# Patient Record
Sex: Female | Born: 1935 | Hispanic: Yes | Smoking: Former smoker
Health system: Southern US, Community
[De-identification: ages and names within clinical notes are randomized; demographics above are authoritative.]

## PROBLEM LIST (undated history)

## (undated) DIAGNOSIS — N200 Calculus of kidney: Secondary | ICD-10-CM

## (undated) DIAGNOSIS — J449 Chronic obstructive pulmonary disease, unspecified: Secondary | ICD-10-CM

## (undated) DIAGNOSIS — I10 Essential (primary) hypertension: Secondary | ICD-10-CM

## (undated) DIAGNOSIS — J439 Emphysema, unspecified: Secondary | ICD-10-CM

## (undated) DIAGNOSIS — I471 Supraventricular tachycardia, unspecified: Secondary | ICD-10-CM

## (undated) DIAGNOSIS — E7800 Pure hypercholesterolemia, unspecified: Secondary | ICD-10-CM

## (undated) DIAGNOSIS — K219 Gastro-esophageal reflux disease without esophagitis: Secondary | ICD-10-CM

## (undated) HISTORY — PX: CATARACT EXTRACTION: SUR2

## (undated) HISTORY — DX: Supraventricular tachycardia, unspecified: I47.10

## (undated) HISTORY — PX: ABDOMINAL HYSTERECTOMY: SHX81

## (undated) HISTORY — DX: Supraventricular tachycardia: I47.1

## (undated) HISTORY — PX: HERNIA REPAIR: SHX51

## (undated) HISTORY — PX: KNEE SURGERY: SHX244

---

## 2002-08-12 ENCOUNTER — Encounter: Payer: Self-pay | Admitting: Specialist

## 2002-08-12 ENCOUNTER — Encounter: Admission: RE | Admit: 2002-08-12 | Discharge: 2002-08-12 | Payer: Self-pay | Admitting: Specialist

## 2012-10-05 ENCOUNTER — Encounter (HOSPITAL_BASED_OUTPATIENT_CLINIC_OR_DEPARTMENT_OTHER): Payer: Self-pay | Admitting: *Deleted

## 2012-10-05 ENCOUNTER — Emergency Department (HOSPITAL_BASED_OUTPATIENT_CLINIC_OR_DEPARTMENT_OTHER)
Admission: EM | Admit: 2012-10-05 | Discharge: 2012-10-05 | Disposition: A | Discharge status: Home / Self Care | Payer: Medicaid Other | Attending: Emergency Medicine | Admitting: Emergency Medicine

## 2012-10-05 ENCOUNTER — Emergency Department (HOSPITAL_BASED_OUTPATIENT_CLINIC_OR_DEPARTMENT_OTHER): Payer: Medicaid Other

## 2012-10-05 DIAGNOSIS — K7689 Other specified diseases of liver: Secondary | ICD-10-CM | POA: Insufficient documentation

## 2012-10-05 DIAGNOSIS — K5730 Diverticulosis of large intestine without perforation or abscess without bleeding: Secondary | ICD-10-CM | POA: Insufficient documentation

## 2012-10-05 DIAGNOSIS — M5137 Other intervertebral disc degeneration, lumbosacral region: Secondary | ICD-10-CM | POA: Insufficient documentation

## 2012-10-05 DIAGNOSIS — R109 Unspecified abdominal pain: Secondary | ICD-10-CM | POA: Insufficient documentation

## 2012-10-05 DIAGNOSIS — Z87891 Personal history of nicotine dependence: Secondary | ICD-10-CM | POA: Insufficient documentation

## 2012-10-05 DIAGNOSIS — J438 Other emphysema: Secondary | ICD-10-CM | POA: Insufficient documentation

## 2012-10-05 DIAGNOSIS — Z87442 Personal history of urinary calculi: Secondary | ICD-10-CM | POA: Insufficient documentation

## 2012-10-05 DIAGNOSIS — N23 Unspecified renal colic: Secondary | ICD-10-CM | POA: Insufficient documentation

## 2012-10-05 DIAGNOSIS — K439 Ventral hernia without obstruction or gangrene: Secondary | ICD-10-CM | POA: Insufficient documentation

## 2012-10-05 DIAGNOSIS — N390 Urinary tract infection, site not specified: Secondary | ICD-10-CM

## 2012-10-05 DIAGNOSIS — M51379 Other intervertebral disc degeneration, lumbosacral region without mention of lumbar back pain or lower extremity pain: Secondary | ICD-10-CM | POA: Insufficient documentation

## 2012-10-05 HISTORY — DX: Calculus of kidney: N20.0

## 2012-10-05 LAB — BASIC METABOLIC PANEL
BUN: 13 mg/dL (ref 6–23)
CO2: 23 mEq/L (ref 19–32)
Calcium: 9.2 mg/dL (ref 8.4–10.5)
Chloride: 102 mEq/L (ref 96–112)
Creatinine, Ser: 0.8 mg/dL (ref 0.50–1.10)
GFR calc Af Amer: 78 mL/min — ABNORMAL LOW (ref >90)
GFR calc non Af Amer: 67 mL/min — ABNORMAL LOW (ref >90)
Glucose, Bld: 123 mg/dL — ABNORMAL HIGH (ref 70–99)
Potassium: 3.7 mEq/L (ref 3.5–5.1)
Sodium: 138 mEq/L (ref 135–145)

## 2012-10-05 LAB — URINALYSIS, ROUTINE W REFLEX MICROSCOPIC
Bilirubin Urine: NEGATIVE
Glucose, UA: NEGATIVE mg/dL
Ketones, ur: NEGATIVE mg/dL
Nitrite: POSITIVE — AB
Protein, ur: NEGATIVE mg/dL
Specific Gravity, Urine: 1.019 (ref 1.005–1.030)
Urobilinogen, UA: 0.2 mg/dL (ref 0.0–1.0)
pH: 5 (ref 5.0–8.0)

## 2012-10-05 LAB — CBC WITH DIFFERENTIAL/PLATELET
Basophils Absolute: 0 10*3/uL (ref 0.0–0.1)
Basophils Relative: 1 % (ref 0–1)
Eosinophils Absolute: 0.1 10*3/uL (ref 0.0–0.7)
Eosinophils Relative: 1 % (ref 0–5)
HCT: 45.7 % (ref 36.0–46.0)
Hemoglobin: 15.7 g/dL — ABNORMAL HIGH (ref 12.0–15.0)
Lymphocytes Relative: 20 % (ref 12–46)
Lymphs Abs: 1.6 10*3/uL (ref 0.7–4.0)
MCH: 28.6 pg (ref 26.0–34.0)
MCHC: 34.4 g/dL (ref 30.0–36.0)
MCV: 83.2 fL (ref 78.0–100.0)
Monocytes Absolute: 0.6 10*3/uL (ref 0.1–1.0)
Monocytes Relative: 7 % (ref 3–12)
Neutro Abs: 5.8 10*3/uL (ref 1.7–7.7)
Neutrophils Relative %: 72 % (ref 43–77)
Platelets: 248 10*3/uL (ref 150–400)
RBC: 5.49 MIL/uL — ABNORMAL HIGH (ref 3.87–5.11)
RDW: 12.9 % (ref 11.5–15.5)
WBC: 8.1 10*3/uL (ref 4.0–10.5)

## 2012-10-05 LAB — URINE MICROSCOPIC-ADD ON

## 2012-10-05 MED ORDER — HYDROMORPHONE HCL PF 1 MG/ML IJ SOLN
1.0000 mg | Freq: Once | INTRAMUSCULAR | Status: AC
  Administered 2012-10-05: 1 mg via INTRAVENOUS
  Filled 2012-10-05: qty 1

## 2012-10-05 MED ORDER — CIPROFLOXACIN HCL 500 MG PO TABS: 500.0000 mg | ORAL_TABLET | Freq: Two times a day (BID) | ORAL | Status: DC

## 2012-10-05 MED ORDER — ONDANSETRON HCL 4 MG/2ML IJ SOLN
4.0000 mg | Freq: Once | INTRAMUSCULAR | Status: AC
  Administered 2012-10-05: 4 mg via INTRAVENOUS
  Filled 2012-10-05: qty 2

## 2012-10-05 MED ORDER — HYDROCODONE-ACETAMINOPHEN 5-325 MG PO TABS: 1.0000 | ORAL_TABLET | ORAL | Status: AC | PRN

## 2012-10-05 NOTE — ED Provider Notes (Signed)
History     CSN: [id number]  Arrival date & time 10/05/12  1235   First MD Initiated Contact with Patient 10/05/12 1335      Chief Complaint  Patient presents with  . Flank Pain    (Consider location/radiation/quality/duration/timing/severity/associated sxs/prior treatment) HPI Comments: Patient is an 78 year old woman who has a prior history of urinary infection as well as of kidney stones.  She started having pain in the left flank on Saturday, 3 days ago.  Patient is a 78 y.o. female presenting with flank pain. The history is provided by the patient and a relative. No language interpreter was used.  Flank Pain This is a new problem. The current episode started more than 2 days ago. Episode frequency: Intermittent left flank pain. The problem has not changed since onset.Associated symptoms comments: None.. Nothing aggravates the symptoms. Nothing relieves the symptoms. She has tried nothing for the symptoms. The treatment provided no relief.    Past Medical History  Diagnosis Date  . Kidney stones     History reviewed. No pertinent past surgical history.  History reviewed. No pertinent family history.  History  Substance Use Topics  . Smoking status: Former Games developer  . Smokeless tobacco: Not on file  . Alcohol Use:     OB History    Grav Para Term Preterm Abortions TAB SAB Ect Mult Living                  Review of Systems  Constitutional: Negative for fever and chills.  HENT: Negative.   Eyes: Negative.   Respiratory: Negative.   Cardiovascular: Negative.   Gastrointestinal: Negative.   Genitourinary: Positive for flank pain.  Musculoskeletal: Negative.   Skin: Negative.   Neurological: Negative.   Psychiatric/Behavioral: Negative.     Allergies  Review of patient's allergies indicates no known allergies.  Home Medications   Current Outpatient Rx  Name  Route  Sig  Dispense  Refill  . CIPROFLOXACIN HCL 500 MG PO TABS   Oral   Take 1 tablet (500 mg  total) by mouth every 12 (twelve) hours.   10 tablet   0   . HYDROCODONE-ACETAMINOPHEN 5-325 MG PO TABS   Oral   Take 1 tablet by mouth every 4 (four) hours as needed for pain.   20 tablet   0     BP 142/63  Pulse 81  Temp 98.2 F (36.8 C) (Oral)  Resp 18  SpO2 100%  Physical Exam  Nursing note and vitals reviewed. Constitutional: She is oriented to person, place, and time. She appears well-developed and well-nourished.       In mild-moderate distress with left flank pain.  HENT:  Head: Normocephalic and atraumatic.  Right Ear: External ear normal.  Left Ear: External ear normal.  Mouth/Throat: Oropharynx is clear and moist.  Eyes: Conjunctivae normal and EOM are normal.  Neck: Normal range of motion. Neck supple.  Cardiovascular: Normal rate, regular rhythm and normal heart sounds.   Pulmonary/Chest: Effort normal and breath sounds normal.  Abdominal: Soft. Bowel sounds are normal.  Musculoskeletal: Normal range of motion. She exhibits no edema and no tenderness.       She localizes pain to the left flank.  There is no bony deformity or CVA tenderness.  Neurological: She is alert and oriented to person, place, and time.       No sensory or motor deficit.  Skin: Skin is warm and dry.  Psychiatric: She has a normal mood and affect.  Her behavior is normal.    ED Course  Procedures (including critical care time)  Labs Reviewed  URINALYSIS, ROUTINE W REFLEX MICROSCOPIC - Abnormal; Notable for the following:    APPearance CLOUDY (*)     Hgb urine dipstick SMALL (*)     Nitrite POSITIVE (*)     Leukocytes, UA SMALL (*)     All other components within normal limits  URINE MICROSCOPIC-ADD ON - Abnormal; Notable for the following:    Squamous Epithelial / LPF MANY (*)     Bacteria, UA MANY (*)     All other components within normal limits  CBC WITH DIFFERENTIAL - Abnormal; Notable for the following:    RBC 5.49 (*)     Hemoglobin 15.7 (*)     All other components  within normal limits  BASIC METABOLIC PANEL - Abnormal; Notable for the following:    Glucose, Bld 123 (*)     GFR calc non Af Amer 67 (*)     GFR calc Af Amer 78 (*)     All other components within normal limits  URINE CULTURE   Ct Abdomen Pelvis Wo Contrast  10/05/2012  *RADIOLOGY REPORT*  Clinical Data: Left-sided flank pain, history of renal stones  CT ABDOMEN AND PELVIS WITHOUT CONTRAST  Technique:  Multidetector CT imaging of the abdomen and pelvis was performed following the standard protocol without intravenous contrast.  Comparison: None.  Findings:  The lack of intravenous contrast limits the ability to evaluate solid abdominal organs.  Normal noncontrast appearance of the bilateral kidneys.  No renal stones or evidence of urinary obstruction.  No stones are seen within the expected location of either ureter or the urinary bladder.  The urinary bladder is decompressed otherwise normal.  No evidence of urinary obstruction or perinephric stranding.  Normal hepatic contour.  There is an approximately 3.8 x 2.7 cm hypoattenuating lesion within the dome of the left lobe of the liver (image 23, series 2 as well as an adjacent approximate 2.1 x 2.0 cm hypoattenuating lesion within the medial segment left lobe of liver (image 28).  There is mild diffuse decreased attenuation of the hepatic parenchyma suggestive of hepatic steatosis.  Normal noncontrast appearance of the gallbladder.  No definite ascites.  Normal noncontrast appearance of the bilateral adrenal glands, pancreas and spleen.  Extensive pan colonic diverticulosis without evidence of diverticulitis on this noncontrast examination.  The bowel is otherwise normal in course and caliber without wall thickening or evidence of obstruction.  Normal noncontrast appearance of the appendix.  No pneumoperitoneum, pneumatosis or portal venous gas.  Mild ectasia of the infrarenal abdominal aorta measuring approximately 2.5 cm in greatest transverse axial  dimension (image 43, series 2).  Moderate amount of calcified atherosclerotic plaque within the abdominal aorta.  Separate origins of the proper hepatic and splenic artery are suspected to arise directly from the abdominal aorta.  No retroperitoneal, mesenteric, pelvic or inguinal lymphadenopathy.  Post hysterectomy.  No free fluid within the pelvis.  No discrete adnexal lesion.  Limited visualization of the lower thorax demonstrates marked centrilobular and paraseptal emphysematous change.  There is minimal subpleural reticulation without honeycombing.  No focal airspace opacities.  No pleural effusion.  Minimal subsegmental atelectasis adjacent to the right major fissure.  No acute or aggressive osseous abnormalities.  Moderate degenerative change of L5 - S1 with disc space height loss, end plate irregularities sclerosis.  A bone island within the proximal right femur.  There is a wide neck mesenteric  fat containing midline ventral abdominal wall hernia is not measures approximately 2 cm in diameter (image 43).  Bilateral mesenteric fat containing inguinal hernias, right greater than left.  IMPRESSION:  1.  No explanation for the patient's left sided flank pain. Specifically, no evidence of nephrolithiasis urinary obstruction. 2.  Pan colonic diverticulosis without evidence of diverticulitis on this noncontrast examination.  3.  Wide neck mesenteric fat containing midline ventral abdominal wall hernia.  4.  Hypoattenuating hepatic lesions, incompletely evaluated without intravenous contrast though in the absence of a known primary malignancy likely represent hepatic cysts.  Further evaluation with abdominal ultrasound performed as clinically indicated. 5.  Suspected mild hepatic steatosis.  6.  Severe centrilobular and paraseptal emphysematous change within the imaged bilateral lung bases.  7.  Moderate DDD of L5 - S1.   Original Report Authenticated By: Tacey Ruiz, MD      1. Urinary tract infection      Rx for UTI with Cipro, and for hydrocodone-acetaminophen for pain.    Carleene Cooper III, MD 10/05/12 [phone]

## 2012-10-05 NOTE — ED Notes (Signed)
Pt amb to room 2 with quick steady gait . Pt reports hx of renal stones in her left kidney and started having the same type of pain on Saturday as her usual stone presentation. Pt amb to restroom for ccua.

## 2012-10-07 LAB — URINE CULTURE: Colony Count: >=100000

## 2012-10-08 NOTE — ED Notes (Signed)
+   Urine Patient treated with Cipro-sensitive to same-chart appended per protocol MD. 

## 2013-05-07 ENCOUNTER — Emergency Department (HOSPITAL_BASED_OUTPATIENT_CLINIC_OR_DEPARTMENT_OTHER): Payer: Medicaid Other

## 2013-05-07 ENCOUNTER — Encounter (HOSPITAL_BASED_OUTPATIENT_CLINIC_OR_DEPARTMENT_OTHER): Payer: Self-pay | Admitting: *Deleted

## 2013-05-07 ENCOUNTER — Emergency Department (HOSPITAL_BASED_OUTPATIENT_CLINIC_OR_DEPARTMENT_OTHER)
Admission: EM | Admit: 2013-05-07 | Discharge: 2013-05-07 | Disposition: A | Discharge status: Home / Self Care | Payer: Medicaid Other | Attending: Emergency Medicine | Admitting: Emergency Medicine

## 2013-05-07 DIAGNOSIS — M546 Pain in thoracic spine: Secondary | ICD-10-CM

## 2013-05-07 DIAGNOSIS — Z87442 Personal history of urinary calculi: Secondary | ICD-10-CM | POA: Insufficient documentation

## 2013-05-07 DIAGNOSIS — Z87891 Personal history of nicotine dependence: Secondary | ICD-10-CM | POA: Insufficient documentation

## 2013-05-07 DIAGNOSIS — R071 Chest pain on breathing: Secondary | ICD-10-CM | POA: Insufficient documentation

## 2013-05-07 DIAGNOSIS — Z9889 Other specified postprocedural states: Secondary | ICD-10-CM | POA: Insufficient documentation

## 2013-05-07 DIAGNOSIS — R109 Unspecified abdominal pain: Secondary | ICD-10-CM | POA: Insufficient documentation

## 2013-05-07 DIAGNOSIS — R0781 Pleurodynia: Secondary | ICD-10-CM

## 2013-05-07 LAB — URINE MICROSCOPIC-ADD ON

## 2013-05-07 LAB — COMPREHENSIVE METABOLIC PANEL
ALT: 12 U/L (ref 0–35)
AST: 16 U/L (ref 0–37)
Albumin: 3.9 g/dL (ref 3.5–5.2)
Alkaline Phosphatase: 74 U/L (ref 39–117)
BUN: 17 mg/dL (ref 6–23)
CO2: 22 mEq/L (ref 19–32)
Calcium: 9.5 mg/dL (ref 8.4–10.5)
Chloride: 108 mEq/L (ref 96–112)
Creatinine, Ser: 0.8 mg/dL (ref 0.50–1.10)
GFR calc Af Amer: 78 mL/min — ABNORMAL LOW (ref >90)
GFR calc non Af Amer: 67 mL/min — ABNORMAL LOW (ref >90)
Glucose, Bld: 83 mg/dL (ref 70–99)
Potassium: 3.7 mEq/L (ref 3.5–5.1)
Sodium: 142 mEq/L (ref 135–145)
Total Bilirubin: 0.5 mg/dL (ref 0.3–1.2)
Total Protein: 7.3 g/dL (ref 6.0–8.3)

## 2013-05-07 LAB — CBC WITH DIFFERENTIAL/PLATELET
Basophils Absolute: 0.1 10*3/uL (ref 0.0–0.1)
Basophils Relative: 1 % (ref 0–1)
Eosinophils Absolute: 0.2 10*3/uL (ref 0.0–0.7)
Eosinophils Relative: 2 % (ref 0–5)
HCT: 46.4 % — ABNORMAL HIGH (ref 36.0–46.0)
Hemoglobin: 15.6 g/dL — ABNORMAL HIGH (ref 12.0–15.0)
Lymphocytes Relative: 42 % (ref 12–46)
Lymphs Abs: 3 10*3/uL (ref 0.7–4.0)
MCH: 28.9 pg (ref 26.0–34.0)
MCHC: 33.6 g/dL (ref 30.0–36.0)
MCV: 85.9 fL (ref 78.0–100.0)
Monocytes Absolute: 0.6 10*3/uL (ref 0.1–1.0)
Monocytes Relative: 9 % (ref 3–12)
Neutro Abs: 3.3 10*3/uL (ref 1.7–7.7)
Neutrophils Relative %: 46 % (ref 43–77)
Platelets: 234 10*3/uL (ref 150–400)
RBC: 5.4 MIL/uL — ABNORMAL HIGH (ref 3.87–5.11)
RDW: 13.2 % (ref 11.5–15.5)
WBC: 7.1 10*3/uL (ref 4.0–10.5)

## 2013-05-07 LAB — URINALYSIS, ROUTINE W REFLEX MICROSCOPIC
Bilirubin Urine: NEGATIVE
Glucose, UA: NEGATIVE mg/dL
Ketones, ur: NEGATIVE mg/dL
Nitrite: NEGATIVE
Protein, ur: NEGATIVE mg/dL
Specific Gravity, Urine: 1.025 (ref 1.005–1.030)
Urobilinogen, UA: 0.2 mg/dL (ref 0.0–1.0)
pH: 5.5 (ref 5.0–8.0)

## 2013-05-07 LAB — TROPONIN I: Troponin I: <0.3 ng/mL (ref <0.30)

## 2013-05-07 MED ORDER — TRAMADOL HCL 50 MG PO TABS: 50.0000 mg | ORAL_TABLET | Freq: Four times a day (QID) | ORAL | Status: DC | PRN

## 2013-05-07 MED ORDER — TRAMADOL HCL 50 MG PO TABS
100.0000 mg | ORAL_TABLET | Freq: Once | ORAL | Status: AC
  Administered 2013-05-07: 100 mg via ORAL
  Filled 2013-05-07: qty 2

## 2013-05-07 NOTE — ED Notes (Signed)
MD at bedside. 

## 2013-05-07 NOTE — ED Provider Notes (Signed)
History    CSN: [id number] Arrival date & time 05/07/13  1313  First MD Initiated Contact with Patient 05/07/13 1338     Chief Complaint  Patient presents with  . Back Pain   (Consider location/radiation/quality/duration/timing/severity/associated sxs/prior Treatment) HPI Comments: No skin rash, not worse with deep breath, no cough.  No lower leg swelling or pain.    Patient is a 79 y.o. female presenting with back pain. The history is provided by the patient and a relative. The history is limited by a language barrier.  Back Pain Location:  Thoracic spine (right lateral posterior rib cage area.) Quality:  Aching Radiates to:  Does not radiate Pain severity:  Moderate Pain is:  Same all the time Onset quality:  Gradual Duration:  1 week Timing:  Constant Progression:  Waxing and waning Chronicity:  Recurrent (She recalls having similar pain about 6 months ago, seen here.  Review of records however shows she had left flank pain) Worsened by:  Movement Associated symptoms: no abdominal pain, no chest pain, no dysuria, no fever and no numbness    Past Medical History  Diagnosis Date  . Kidney stones    Past Surgical History  Procedure Laterality Date  . Hernia repair     History reviewed. No pertinent family history. History  Substance Use Topics  . Smoking status: Former Games developer  . Smokeless tobacco: Not on file  . Alcohol Use: Not on file   OB History   Grav Para Term Preterm Abortions TAB SAB Ect Mult Living                 Review of Systems  Unable to perform ROS: Other  Constitutional: Negative for fever.  Cardiovascular: Negative for chest pain.  Gastrointestinal: Negative for abdominal pain.  Genitourinary: Negative for dysuria.  Musculoskeletal: Positive for back pain.  Neurological: Negative for numbness.    Allergies  Review of patient's allergies indicates no known allergies.  Home Medications   Current Outpatient Rx  Name  Route  Sig   Dispense  Refill  . ciprofloxacin (CIPRO) 500 MG tablet   Oral   Take 1 tablet (500 mg total) by mouth every 12 (twelve) hours.   10 tablet   0   . traMADol (ULTRAM) 50 MG tablet   Oral   Take 1 tablet (50 mg total) by mouth every 6 (six) hours as needed for pain.   20 tablet   0    BP 135/66  Pulse 75  Temp(Src) 98.2 F (36.8 C) (Oral)  Resp 20  Wt 170 lb (77.111 kg)  SpO2 94% Physical Exam  Nursing note and vitals reviewed. Constitutional: She is oriented to person, place, and time. She appears well-developed and well-nourished. No distress.  HENT:  Head: Normocephalic and atraumatic.  Eyes: EOM are normal. No scleral icterus.  Neck: Normal range of motion. Neck supple.  Cardiovascular: Normal rate, regular rhythm and intact distal pulses.   No murmur heard. Pulmonary/Chest: Effort normal. No respiratory distress. She has no wheezes. She has no rales.  Abdominal: Soft. Normal appearance. She exhibits mass. She exhibits no distension. There is tenderness in the epigastric area and periumbilical area. There is no rebound. A hernia is present.  Musculoskeletal:       Back:  Neurological: She is alert and oriented to person, place, and time. She exhibits normal muscle tone. Coordination normal.  Skin: Skin is warm, dry and intact. No rash noted. She is not diaphoretic.  ED Course  Procedures (including critical care time) Labs Reviewed  CBC WITH DIFFERENTIAL - Abnormal; Notable for the following:    RBC 5.40 (*)    Hemoglobin 15.6 (*)    HCT 46.4 (*)    All other components within normal limits  COMPREHENSIVE METABOLIC PANEL - Abnormal; Notable for the following:    GFR calc non Af Amer 67 (*)    GFR calc Af Amer 78 (*)    All other components within normal limits  URINALYSIS, ROUTINE W REFLEX MICROSCOPIC - Abnormal; Notable for the following:    APPearance CLOUDY (*)    Hgb urine dipstick TRACE (*)    Leukocytes, UA TRACE (*)    All other components within  normal limits  URINE MICROSCOPIC-ADD ON - Abnormal; Notable for the following:    Squamous Epithelial / LPF MANY (*)    Bacteria, UA MANY (*)    All other components within normal limits  URINE CULTURE  TROPONIN I   Dg Ribs Unilateral W/chest Right  05/07/2013   *RADIOLOGY REPORT*  Clinical Data: Right rib pain  RIGHT RIBS AND CHEST - 3+ VIEW  Comparison: None  Findings: Heart size is normal.  Negative for heart failure or effusion.  Negative for pneumonia.  Negative for right rib fracture.  Negative for pneumothorax.  IMPRESSION: No acute abnormality.   Original Report Authenticated By: Janeece Riggers, M.D.   1. Rib pain on right side   2. Thoracic back pain     RA sat is 94% and I interpret to be adequate.   ECG at time 14:22 shows sinus bradycardia at rate 59, normal axis, T wave inversions inferior leads, normal intervals. No prior ECG.  Abn ECG.    3:33 PM Pt reports feeling improved.  Labs and CXR are neg.  Will d/c home, give Rx for ultram and refer to PCP.  MDM  I reviewed prior note from December 2013.  Pt presented with left flank pain, had a negative non contrast CT of abd/pelvis due to a reported h/o kidney stones.  It was negative for stone, treated for UTI with Cipro and Vicodin.  Pt reports having N/V after taking meds, but cannot recall what her medications were.  Thus, unsure which of her meds she had reaction to.  My assumption is Norco may have caused her to have N/V which is fairly common.  Will check ECG, troponin, check UA, rib and chest xray and obtain labs including LFT's.    Gavin Pound. Dondrea Clendenin, MD 05/07/13 1540

## 2013-05-07 NOTE — ED Notes (Signed)
Pt has hx of back problems, but over the past 2 weeks has gotten worse.

## 2013-05-07 NOTE — ED Notes (Signed)
Patient transported to X-ray 

## 2013-05-09 LAB — URINE CULTURE
Colony Count: NO GROWTH
Culture: NO GROWTH

## 2014-04-06 ENCOUNTER — Encounter (HOSPITAL_BASED_OUTPATIENT_CLINIC_OR_DEPARTMENT_OTHER): Payer: Self-pay | Admitting: Emergency Medicine

## 2014-04-06 ENCOUNTER — Emergency Department (HOSPITAL_BASED_OUTPATIENT_CLINIC_OR_DEPARTMENT_OTHER)
Admission: EM | Admit: 2014-04-06 | Discharge: 2014-04-06 | Disposition: A | Discharge status: Home / Self Care | Payer: Medicaid Other | Attending: Emergency Medicine | Admitting: Emergency Medicine

## 2014-04-06 ENCOUNTER — Emergency Department (HOSPITAL_BASED_OUTPATIENT_CLINIC_OR_DEPARTMENT_OTHER): Payer: Medicaid Other

## 2014-04-06 ENCOUNTER — Other Ambulatory Visit: Payer: Self-pay

## 2014-04-06 DIAGNOSIS — R21 Rash and other nonspecific skin eruption: Secondary | ICD-10-CM | POA: Insufficient documentation

## 2014-04-06 DIAGNOSIS — J441 Chronic obstructive pulmonary disease with (acute) exacerbation: Secondary | ICD-10-CM | POA: Insufficient documentation

## 2014-04-06 DIAGNOSIS — Z79899 Other long term (current) drug therapy: Secondary | ICD-10-CM | POA: Insufficient documentation

## 2014-04-06 DIAGNOSIS — I10 Essential (primary) hypertension: Secondary | ICD-10-CM | POA: Insufficient documentation

## 2014-04-06 DIAGNOSIS — R7981 Abnormal blood-gas level: Secondary | ICD-10-CM

## 2014-04-06 DIAGNOSIS — K219 Gastro-esophageal reflux disease without esophagitis: Secondary | ICD-10-CM | POA: Insufficient documentation

## 2014-04-06 DIAGNOSIS — Z87891 Personal history of nicotine dependence: Secondary | ICD-10-CM | POA: Insufficient documentation

## 2014-04-06 DIAGNOSIS — R0902 Hypoxemia: Secondary | ICD-10-CM | POA: Insufficient documentation

## 2014-04-06 DIAGNOSIS — M129 Arthropathy, unspecified: Secondary | ICD-10-CM | POA: Insufficient documentation

## 2014-04-06 DIAGNOSIS — Z87442 Personal history of urinary calculi: Secondary | ICD-10-CM | POA: Insufficient documentation

## 2014-04-06 DIAGNOSIS — Z791 Long term (current) use of non-steroidal anti-inflammatories (NSAID): Secondary | ICD-10-CM | POA: Insufficient documentation

## 2014-04-06 DIAGNOSIS — E7800 Pure hypercholesterolemia, unspecified: Secondary | ICD-10-CM | POA: Insufficient documentation

## 2014-04-06 DIAGNOSIS — N390 Urinary tract infection, site not specified: Secondary | ICD-10-CM | POA: Insufficient documentation

## 2014-04-06 HISTORY — DX: Gastro-esophageal reflux disease without esophagitis: K21.9

## 2014-04-06 HISTORY — DX: Chronic obstructive pulmonary disease, unspecified: J44.9

## 2014-04-06 HISTORY — DX: Pure hypercholesterolemia, unspecified: E78.00

## 2014-04-06 HISTORY — DX: Essential (primary) hypertension: I10

## 2014-04-06 LAB — COMPREHENSIVE METABOLIC PANEL
ALT: 16 U/L (ref 0–35)
AST: 25 U/L (ref 0–37)
Albumin: 3.5 g/dL (ref 3.5–5.2)
Alkaline Phosphatase: 72 U/L (ref 39–117)
BUN: 26 mg/dL — ABNORMAL HIGH (ref 6–23)
CO2: 23 mEq/L (ref 19–32)
Calcium: 8.8 mg/dL (ref 8.4–10.5)
Chloride: 93 mEq/L — ABNORMAL LOW (ref 96–112)
Creatinine, Ser: 0.9 mg/dL (ref 0.50–1.10)
GFR calc Af Amer: 67 mL/min — ABNORMAL LOW (ref >90)
GFR calc non Af Amer: 58 mL/min — ABNORMAL LOW (ref >90)
Glucose, Bld: 108 mg/dL — ABNORMAL HIGH (ref 70–99)
Potassium: 3.6 mEq/L — ABNORMAL LOW (ref 3.7–5.3)
Sodium: 134 mEq/L — ABNORMAL LOW (ref 137–147)
Total Bilirubin: 0.6 mg/dL (ref 0.3–1.2)
Total Protein: 7.4 g/dL (ref 6.0–8.3)

## 2014-04-06 LAB — CBC WITH DIFFERENTIAL/PLATELET
Basophils Absolute: 0 10*3/uL (ref 0.0–0.1)
Basophils Relative: 0 % (ref 0–1)
Eosinophils Absolute: 0.2 10*3/uL (ref 0.0–0.7)
Eosinophils Relative: 4 % (ref 0–5)
HCT: 44.7 % (ref 36.0–46.0)
Hemoglobin: 15.7 g/dL — ABNORMAL HIGH (ref 12.0–15.0)
Lymphocytes Relative: 20 % (ref 12–46)
Lymphs Abs: 0.9 10*3/uL (ref 0.7–4.0)
MCH: 29.8 pg (ref 26.0–34.0)
MCHC: 35.1 g/dL (ref 30.0–36.0)
MCV: 84.8 fL (ref 78.0–100.0)
Monocytes Absolute: 0.5 10*3/uL (ref 0.1–1.0)
Monocytes Relative: 11 % (ref 3–12)
Neutro Abs: 2.9 10*3/uL (ref 1.7–7.7)
Neutrophils Relative %: 64 % (ref 43–77)
Platelets: 125 10*3/uL — ABNORMAL LOW (ref 150–400)
RBC: 5.27 MIL/uL — ABNORMAL HIGH (ref 3.87–5.11)
RDW: 12.6 % (ref 11.5–15.5)
WBC: 4.6 10*3/uL (ref 4.0–10.5)

## 2014-04-06 LAB — URINE MICROSCOPIC-ADD ON

## 2014-04-06 LAB — URINALYSIS, ROUTINE W REFLEX MICROSCOPIC
Glucose, UA: NEGATIVE mg/dL
Ketones, ur: 15 mg/dL — AB
Nitrite: NEGATIVE
Protein, ur: 30 mg/dL — AB
Specific Gravity, Urine: 1.028 (ref 1.005–1.030)
Urobilinogen, UA: 1 mg/dL (ref 0.0–1.0)
pH: 5.5 (ref 5.0–8.0)

## 2014-04-06 LAB — TROPONIN I: Troponin I: <0.3 ng/mL (ref <0.30)

## 2014-04-06 MED ORDER — CIPROFLOXACIN HCL 500 MG PO TABS: 500.0000 mg | ORAL_TABLET | Freq: Two times a day (BID) | ORAL | Status: DC

## 2014-04-06 MED ORDER — ALBUTEROL SULFATE (2.5 MG/3ML) 0.083% IN NEBU: 5.0000 mg | INHALATION_SOLUTION | Freq: Once | RESPIRATORY_TRACT | Status: DC

## 2014-04-06 MED ORDER — OMEPRAZOLE 20 MG PO CPDR: 20.0000 mg | DELAYED_RELEASE_CAPSULE | Freq: Every day | ORAL | Status: DC

## 2014-04-06 MED ORDER — SODIUM CHLORIDE 0.9 % IV BOLUS (SEPSIS)
1000.0000 mL | Freq: Once | INTRAVENOUS | Status: AC
Start: 2014-04-06 — End: 2014-04-06
  Administered 2014-04-06: 1000 mL via INTRAVENOUS

## 2014-04-06 MED ORDER — DIPHENHYDRAMINE HCL 25 MG PO TABS: 25.0000 mg | ORAL_TABLET | Freq: Four times a day (QID) | ORAL | Status: DC

## 2014-04-06 NOTE — ED Notes (Signed)
Returned from radiology. O2 2LNC applied

## 2014-04-06 NOTE — ED Notes (Signed)
Patient ambulated in hall on room air.  SpO2 87%, HR 96, RR 20.  After short distance walking SpO2 88-90%, HR 101, RR 22.  Once back in room SpO2 on monitor 87% on room air and recovered to 92%. +DOE noted after returning to room.

## 2014-04-06 NOTE — ED Notes (Addendum)
MD at bedside-pt son now at [facility]

## 2014-04-06 NOTE — ED Notes (Signed)
O2 off with continuous sat per EDP orders

## 2014-04-06 NOTE — ED Notes (Signed)
Per granddaughter-pt c/o weak, fever, nausea, decreased appetite x 1 week-scattered rash started today-pt A/O-speaks minimal english

## 2014-04-06 NOTE — ED Notes (Addendum)
Started on O2 2LNC-EDP notified

## 2014-04-06 NOTE — ED Notes (Signed)
EDP at [facility]-pt appears slight SOB when talking with granddaughter-placed on sat-89% RA-granddaughter states pt has emphysema-orders to keep RA unless <89%

## 2014-04-06 NOTE — ED Provider Notes (Addendum)
CSN: [id number]     Arrival date & time 04/06/14  1102 History   First MD Initiated Contact with Patient 04/06/14 1127     Chief Complaint  Patient presents with  . Weakness     (Consider location/radiation/quality/duration/timing/severity/associated sxs/prior Treatment) Patient is a 80 y.o. female presenting with weakness. The history is provided by a relative. The history is limited by a language barrier. A language interpreter was used.  Weakness This is a new problem. Episode onset: 1 week. The problem occurs constantly. The problem has been gradually worsening. Associated symptoms include headaches and shortness of breath. Pertinent negatives include no chest pain and no abdominal pain. Associated symptoms comments: Subjective fever and chills.  No cough but occassional SOB.  Decreased appetite.  No urinary sx and denies abd pain.  Rash starting today that is itchy. Nothing aggravates the symptoms. Nothing relieves the symptoms. She has tried nothing for the symptoms. The treatment provided no relief.    Past Medical History  Diagnosis Date  . Kidney stones   . Arthritis   . GERD (gastroesophageal reflux disease)   . High cholesterol   . Hypertension   . COPD (chronic obstructive pulmonary disease)    Past Surgical History  Procedure Laterality Date  . Hernia repair    . Knee surgery    . Abdominal hysterectomy    . Cataract extraction     No family history on file. History  Substance Use Topics  . Smoking status: Former Research scientist (life sciences)  . Smokeless tobacco: Not on file  . Alcohol Use: No   OB History   Grav Para Term Preterm Abortions TAB SAB Ect Mult Living                 Review of Systems  Constitutional: Positive for fever, chills and appetite change.  HENT: Negative for rhinorrhea.   Respiratory: Positive for shortness of breath. Negative for cough and wheezing.   Cardiovascular: Negative for chest pain.  Gastrointestinal: Negative for abdominal pain.   Genitourinary: Negative for dysuria.  Neurological: Positive for weakness and headaches.  All other systems reviewed and are negative.     Allergies  Review of patient's allergies indicates no known allergies.  Home Medications   Prior to Admission medications   Medication Sig Start Date End Date Taking? Authorizing Provider  gemfibrozil (LOPID) 600 MG tablet Take 600 mg by mouth 2 (two) times daily before a meal.   Yes Historical Provider, MD  HYDRALAZINE-HCTZ PO Take by mouth.   Yes Historical Provider, MD  nabumetone (RELAFEN) 500 MG tablet Take 500 mg by mouth daily.   Yes Historical Provider, MD  omeprazole (PRILOSEC) 20 MG capsule Take 20 mg by mouth daily.   Yes Historical Provider, MD  ciprofloxacin (CIPRO) 500 MG tablet Take 1 tablet (500 mg total) by mouth every 12 (twelve) hours. 10/05/12   Mylinda Latina III, MD  traMADol (ULTRAM) 50 MG tablet Take 1 tablet (50 mg total) by mouth every 6 (six) hours as needed for pain. 05/07/13   Saddie Benders. Ghim, MD   BP 105/62  Pulse 84  Temp(Src) 98.9 F (37.2 C) (Oral)  Resp 24  SpO2 94% Physical Exam  Nursing note and vitals reviewed. Constitutional: She is oriented to person, place, and time. She appears well-developed and well-nourished. No distress.  HENT:  Head: Normocephalic and atraumatic.  Mouth/Throat: Oropharynx is clear and moist. Mucous membranes are dry.  Eyes: Conjunctivae and EOM are normal. Pupils are equal, round, and reactive  to light.  Neck: Normal range of motion. Neck supple.  Cardiovascular: Normal rate, regular rhythm and intact distal pulses.   No murmur heard. Pulmonary/Chest: Effort normal and breath sounds normal. No respiratory distress. She has no wheezes. She has no rales. She exhibits no tenderness.  Abdominal: Soft. Normal appearance. She exhibits no distension. There is tenderness in the suprapubic area. There is no rebound, no guarding and no CVA tenderness.  Musculoskeletal: Normal range of  motion. She exhibits no edema and no tenderness.  Neurological: She is alert and oriented to person, place, and time.  Skin: Skin is warm and dry. Rash noted. No lesion and no purpura noted. Rash is maculopapular. Rash is not pustular and not vesicular. There is erythema.  Blanching confluent rash over the thighs, upper ext and back.  Excoriated and blanching  Psychiatric: She has a normal mood and affect. Her behavior is normal.    ED Course  Procedures (including critical care time) Labs Review Labs Reviewed  CBC WITH DIFFERENTIAL - Abnormal; Notable for the following:    RBC 5.27 (*)    Hemoglobin 15.7 (*)    Platelets 125 (*)    All other components within normal limits  COMPREHENSIVE METABOLIC PANEL - Abnormal; Notable for the following:    Sodium 134 (*)    Potassium 3.6 (*)    Chloride 93 (*)    Glucose, Bld 108 (*)    BUN 26 (*)    GFR calc non Af Amer 58 (*)    GFR calc Af Amer 67 (*)    All other components within normal limits  URINALYSIS, ROUTINE W REFLEX MICROSCOPIC - Abnormal; Notable for the following:    Color, Urine AMBER (*)    APPearance CLOUDY (*)    Hgb urine dipstick SMALL (*)    Bilirubin Urine MODERATE (*)    Ketones, ur 15 (*)    Protein, ur 30 (*)    Leukocytes, UA SMALL (*)    All other components within normal limits  URINE MICROSCOPIC-ADD ON - Abnormal; Notable for the following:    Squamous Epithelial / LPF FEW (*)    Bacteria, UA MANY (*)    Casts HYALINE CASTS (*)    All other components within normal limits  TROPONIN I    Imaging Review Dg Chest 2 View  04/06/2014   CLINICAL DATA:  Short of breath.  Loss of appetite.  EXAM: CHEST  2 VIEW  COMPARISON:  05/07/2013  FINDINGS: Cardiac silhouette is mildly enlarged. Aorta is tortuous. No mediastinal or hilar masses. The pulmonary arteries are mildly enlarged, but stable.  Lungs are hyperexpanded. There is lung base opacity most consistent with atelectasis/scarring. Emphysema is evident in the  upper lobes. These changes are stable. No lung consolidation or edema. No pleural effusion or pneumothorax.  Bony thorax is demineralized but intact.  IMPRESSION: No acute cardiopulmonary disease.  COPD.   Electronically Signed   By: Lajean Manes M.D.   On: 04/06/2014 12:18     EKG Interpretation   Date/Time:  Thursday April 06 2014 11:44:19 EDT Ventricular Rate:  83 PR Interval:  142 QRS Duration: 86 QT Interval:  368 QTC Calculation: 432 R Axis:   -5 Text Interpretation:  Normal sinus rhythm Septal infarct , age  undetermined No significant change since last tracing Confirmed by  Maryan Rued  MD, Loree Fee ([id number]) on 04/06/2014 12:16:09 PM      MDM   Final diagnoses:  UTI (lower urinary tract infection)  Rash  GERD (gastroesophageal reflux disease)  Borderline low oxygen saturation level    Patient is a only Spanish speaker presenting with family for one week of not feeling well with fever and chills, generalized weakness and decreased appetite. Denies cough but intermittent shortness of breath. Has not been evaluated for this complaint and was feeling better until today she developed a generalized rash that is itchy in nature with worsening general malaise. She symptoms concerning for pharyngitis has no abdominal tenderness except for some mild suprapubic tenderness. Lung sounds are clear however patient desats to 86% on room air and was placed on 2 L of oxygen. She does have a history of being a prior smoker and COPD but does not use inhalers regularly. No prior cardiac etiology an EKG is unchanged from prior. Patient is afebrile here.  Concern for infectious etiology of patient's symptoms UTI versus lung pathology versus atypical ACS as the cause of patient's symptoms. The rash appears to be allergic in nature. Patient has not been on any new medications or antibiotics at this time. She denies any change in diet oral or soaps or laundry detergents.  Chest x-ray, CBC, CMP, UA, troponin  pending. Patient given IV fluids  12:53 PM Labs wnl except for many bacteria in the urine and 3-6 wbc's.  After hydration pt taken off Oxygen and will ambulate.  Pt sats are consistently 89-94%.  She has hx of COPD but denies any worsening of her chronic SOB.  She is laughing and talking to family and states she really just came here for the rash.  Pt and family counseled about low O2 sats and follow up with PCP for more testing.  EKG wnl and trop neg.    Blanchie Dessert, MD 04/06/14 1449  Blanchie Dessert, MD 04/06/14 1453

## 2014-06-12 ENCOUNTER — Encounter (INDEPENDENT_AMBULATORY_CARE_PROVIDER_SITE_OTHER): Payer: Self-pay | Admitting: Surgery

## 2014-06-12 ENCOUNTER — Ambulatory Visit (INDEPENDENT_AMBULATORY_CARE_PROVIDER_SITE_OTHER): Payer: Medicaid Other | Admitting: Surgery

## 2014-06-12 VITALS — BP 122/70 | HR 60 | Temp 98.5°F | Ht 65.0 in | Wt 172.0 lb

## 2014-06-12 DIAGNOSIS — K432 Incisional hernia without obstruction or gangrene: Secondary | ICD-10-CM | POA: Insufficient documentation

## 2014-06-12 NOTE — Progress Notes (Signed)
General Surgery [facility]  Chief Complaint  Patient presents with  . New Evaluation    Umbilical Hernia - referral from Dr. Iona Beard Osei-Bonsu    HISTORY: Patient is an 80 year old female referred by her primary care physician for evaluation of abdominal wall hernia. Patient has a history of previous hernia repair performed over 10 years ago in [place], [place]. Details of that procedure are not available.  Patient speaks limited [place]. She is accompanied by her granddaughter who acts as Optometrist and is fluent in [place] and [place].  Patient notes that the hernia recurred over 3 years ago. It has gradually increased in size. It now causes some discomfort. She denies any signs or symptoms of obstruction.  Previous abdominal surgery includes hysterectomy and a right inguinal hernia repair. Patient does not know whether mesh was used in the hernia repair or not.  Past Medical History  Diagnosis Date  . Kidney stones   . Arthritis   . GERD (gastroesophageal reflux disease)   . High cholesterol   . Hypertension   . COPD (chronic obstructive pulmonary disease)     Current Outpatient Prescriptions  Medication Sig Dispense Refill  . HYDROcodone-acetaminophen (NORCO) 10-325 MG per tablet Take 1 tablet by mouth every 6 (six) hours as needed.      . nabumetone (RELAFEN) 500 MG tablet Take 500 mg by mouth daily.       No current facility-administered medications for this visit.    No Known Allergies  History reviewed. No pertinent family history.  History   Social History  . Marital Status: Widowed    Spouse Name: N/A    Number of Children: N/A  . Years of Education: N/A   Social History Main Topics  . Smoking status: Former Research scientist (life sciences)  . Smokeless tobacco: None  . Alcohol Use: No  . Drug Use: No  . Sexual Activity: None   Other Topics Concern  . None   Social History Narrative  . None    REVIEW OF SYSTEMS - PERTINENT POSITIVES  ONLY: Denies signs or symptoms of obstruction. Intermittent pain at site of hernia. Gradual enlargement over the past 3 years.  EXAM: Filed Vitals:   06/12/14 0931  BP: 122/70  Pulse: 60  Temp: 98.5 F (36.9 C)    GENERAL: well-developed, well-nourished, no acute distress HEENT: normocephalic; pupils equal and reactive; sclerae clear; dentition good; mucous membranes moist NECK:  No palpable masses in the thyroid bed; symmetric on extension; no palpable anterior or posterior cervical lymphadenopathy; no supraclavicular masses; no tenderness CHEST: clear to auscultation bilaterally without rales, rhonchi, or wheezes CARDIAC: regular rate and rhythm without significant murmur; peripheral pulses are full ABDOMEN: soft without distension; bowel sounds present; no mass; no hepatosplenomegaly; well-healed incision in the upper midline, Pfannenstiel, and right groin; with Valsalva there is an obvious hernia in the upper midline of the abdominal wall underlying the previous surgical incision; fascial defect is approximately 3 cm in diameter but the hernia is not fully reducible and causes mild to moderate discomfort on manipulation; no evidence of umbilical hernia EXT:  non-tender without edema; no deformity NEURO: no gross focal deficits; no sign of tremor   LABORATORY RESULTS: See [facility] (CHL-Epic) for most recent results  RADIOLOGY RESULTS: See [facility] (CHL-Epic) for most recent results  IMPRESSION: Incisional hernia, incarcerated, symptomatic  PLAN: I discussed the above findings at length with the patient and her granddaughter. I provided him with written literature in Spanish to review  at home.  I recommended repair of her incisional hernia which is incarcerated and symptomatic. We discussed the use of prosthetic mesh. I have recommended open repair as opposed to laparoscopic repair. I would plan on an overnight hospital stay following the procedure. We discussed  limitations on her activities after surgery. They understand and agree to proceed.  The risks and benefits of the procedure have been discussed at length with the patient.  The patient understands the proposed procedure, potential alternative treatments, and the course of recovery to be expected.  All of the patient's questions have been answered at this time.  The patient wishes to proceed with surgery.  Earnstine Regal, MD, [facility]  Primary Care Physician: Benito Mccreedy, MD

## 2014-06-12 NOTE — Patient Instructions (Signed)
Hernia ventral  (Ventral Hernia) La hernia ventral (tambin llamada hernia incisional) aparece en el sitio de una herida quirrgica previa (incisin) en el abdomen. La pared abdominal se extiende desde la parte baja del trax hasta la pelvis. Si la pared abdominal se debilita por una incisin quirrgica, puede formarse una hernia. La hernia es una protuberancia del intestino o de tejido muscular que empuja hacia fuera en la parte debilitada de la pared abdominal. La hernia ventral puede agrandarse por un esfuerzo o por levantar objetos pesados.  Las Special educational needs teacher y obesas tienen un mayor riesgo de sufrir una hernia ventral. Tambin tienen un [place] personas que desarrollan infecciones despus de la ciruga o que requieren repetidas incisiones en el mismo sitio del abdomen.  CAUSAS  La hernia ventral se produce debido a la debilidad de la pared abdominal, en el sitio de una incisin.  SNTOMAS  Los sntomas ms comunes son:   Un bulto visible o protuberancia en la pared abdominal.  Dolor o sensibilidad alrededor del bulto.  Aumento de las molestias al toser o al hacer un movimiento repentino. Si la hernia obstruye una parte del intestino, puede haber complicaciones graves (hernia encarcelada o estrangulada). Puede llegar a ser un problema que requiere [place] de emergencia debido a que se corta el flujo de sangre al intestino bloqueado. Los sntomas pueden ser:   Feliberto Harts de vomitar (nuseas).  Vmitos.  Hinchazn en el estmago (distensin) o inflamacin.  Cristy Hilts.  Frecuencia cardaca rpida. DIAGNSTICO  El mdico le har una historia clnica y le har un examen fsico. Le indicar varios estudios, como:   Anlisis de [place]ago.  Pruebas de [place].  Ecografas.  Radiografas.  Tomografa computada (TC). TRATAMIENTO  Una observacin atenta puede ser todo lo que se necesite para una hernia pequea que no causa sntomas. Su mdico puede recomendar el uso de un cinturn de  soporte (braguero) para Print production planner la pared abdominal. Para las hernias ms grandes o dolorosas, se recomienda una ciruga para reparar la hernia. Si la hernia se estrangula, ser necesaria [place].  INSTRUCCIONES PARA EL CUIDADO EN EL HOGAR   Evite ejercer presin o tensin sobre el rea abdominal.  Evite levantar objetos pesados  .  Use una buena postura del cuerpo para realizar tareas fsicas. Consulte a su mdico acerca de las posturas apropiadas.  Use un cinturn de sostn segn las indicaciones de su mdico.  Mantenga un peso saludable.  Consuma alimentos ricos en fibra, como cereales integrales, frutas y vegetales. La fibra ayuda en el caso de dificultad para mover el intestino (constipacin).  Debe ingerir gran cantidad de lquido para mantener la orina de tono claro o color amarillo plido.  Concurra a las visitas de control, segn las indicaciones. SOLICITE ATENCIN MDICA SI:   La hernia es ms grande o ms dolorosa. SOLICITE ATENCIN MDICA DE INMEDIATO SI:   Siente dolor abdominal agudo y de inicio sbito.  El dolor se hace ms intenso.  Tiene vmitos persistentes.  Tiene sudoracin abundante.  Nota que la frecuencia cardaca est acelerada.  Tiene fiebre. ASEGRESE DE QUE:   Comprende estas instrucciones.  Controlar su enfermedad.  Solicitar ayuda de inmediato si no mejora o si empeora. Document Released: 02/07/2013 [facility] Patient Information 2015 [place]. This information is not intended to replace advice given to you by your health care provider. Make sure you discuss any questions you have with your health care provider.

## 2014-07-11 NOTE — Patient Instructions (Addendum)
Brooke Mora  07/11/2014   Your procedure is scheduled on: 07/18/2014     Report to [facility].  Follow the Signs to [place] at 0800       am  Call this number if you have problems the morning of surgery: [phone]   Remember:   Do not eat food or drink liquids after midnight.   Take these medicines the morning of surgery with A SIP OF WATER:    Do not wear jewelry, make-up or nail polish.  Do not wear lotions, powders, or perfumes.  deodorant.  Do not shave 48 hours prior to surgery.   Do not bring valuables to the hospital.  Contacts, dentures or bridgework may not be worn into surgery.  Leave suitcase in the car. After surgery it may be brought to your room.  For patients admitted to the hospital, checkout time is 11:00 AM the day of  discharge.          Please read over the following fact sheets that you were given: [facility] - Preparing for Surgery Before surgery, you can play an important role.  Because skin is not sterile, your skin needs to be as free of germs as possible.  You can reduce the number of germs on your skin by washing with CHG (chlorahexidine gluconate) soap before surgery.  CHG is an antiseptic cleaner which kills germs and bonds with the skin to continue killing germs even after washing. Please DO NOT use if you have an allergy to CHG or antibacterial soaps.  If your skin becomes reddened/irritated stop using the CHG and inform your nurse when you arrive at Short Stay. Do not shave (including legs and underarms) for at least 48 hours prior to the first CHG shower.  You may shave your face/neck. Please follow these instructions carefully:  1.  Shower with CHG Soap the night before surgery and the  morning of Surgery.  2.  If you choose to wash your hair, wash your hair first as usual with your  normal  shampoo.  3.  After you shampoo, rinse your hair and body thoroughly to remove the  shampoo.                           4.  Use CHG as you  would any other liquid soap.  You can apply chg directly  to the skin and wash                       Gently with a scrungie or clean washcloth.  5.  Apply the CHG Soap to your body ONLY FROM THE NECK DOWN.   Do not use on face/ open                           Wound or open sores. Avoid contact with eyes, ears mouth and genitals (private parts).                       Wash face,  Genitals (private parts) with your normal soap.             6.  Wash thoroughly, paying special attention to the area where your surgery  will be performed.  7.  Thoroughly rinse your body with warm water from the neck down.  8.  DO NOT shower/wash with your normal  soap after using and rinsing off  the CHG Soap.                9.  Pat yourself dry with a clean towel.            10.  Wear clean pajamas.            11.  Place clean sheets on your bed the night of your first shower and do not  sleep with pets. Day of Surgery : Do not apply any lotions/deodorants the morning of surgery.  Please wear clean clothes to the hospital/surgery center.  FAILURE TO FOLLOW THESE INSTRUCTIONS MAY RESULT IN THE CANCELLATION OF YOUR SURGERY PATIENT SIGNATURE_________________________________  NURSE SIGNATURE__________________________________  ________________________________________________________________________ , coughing and deep breathing exercises, leg exercises

## 2014-07-12 ENCOUNTER — Encounter (HOSPITAL_COMMUNITY)
Admission: RE | Admit: 2014-07-12 | Discharge: 2014-07-12 | Disposition: A | Discharge status: Home / Self Care | Payer: Medicaid Other | Source: Ambulatory Visit | Attending: Surgery | Admitting: Surgery

## 2014-07-12 ENCOUNTER — Encounter (HOSPITAL_COMMUNITY): Payer: Self-pay | Admitting: Pharmacy Technician

## 2014-07-12 ENCOUNTER — Encounter (HOSPITAL_COMMUNITY): Payer: Self-pay

## 2014-07-12 DIAGNOSIS — K430 Incisional hernia with obstruction, without gangrene: Secondary | ICD-10-CM | POA: Insufficient documentation

## 2014-07-12 DIAGNOSIS — Z01812 Encounter for preprocedural laboratory examination: Secondary | ICD-10-CM | POA: Diagnosis present

## 2014-07-12 LAB — CBC
HCT: 45.7 % (ref 36.0–46.0)
Hemoglobin: 15.1 g/dL — ABNORMAL HIGH (ref 12.0–15.0)
MCH: 28.8 pg (ref 26.0–34.0)
MCHC: 33 g/dL (ref 30.0–36.0)
MCV: 87.2 fL (ref 78.0–100.0)
Platelets: 205 10*3/uL (ref 150–400)
RBC: 5.24 MIL/uL — ABNORMAL HIGH (ref 3.87–5.11)
RDW: 13.3 % (ref 11.5–15.5)
WBC: 7.3 10*3/uL (ref 4.0–10.5)

## 2014-07-12 LAB — BASIC METABOLIC PANEL
Anion gap: 10 (ref 5–15)
BUN: 15 mg/dL (ref 6–23)
CO2: 26 mEq/L (ref 19–32)
Calcium: 9.2 mg/dL (ref 8.4–10.5)
Chloride: 106 mEq/L (ref 96–112)
Creatinine, Ser: 0.71 mg/dL (ref 0.50–1.10)
GFR calc Af Amer: >90 mL/min (ref >90)
GFR calc non Af Amer: 78 mL/min — ABNORMAL LOW (ref >90)
Glucose, Bld: 106 mg/dL — ABNORMAL HIGH (ref 70–99)
Potassium: 4.4 mEq/L (ref 3.7–5.3)
Sodium: 142 mEq/L (ref 137–147)

## 2014-07-14 NOTE — Progress Notes (Signed)
Quick Note:  These results are acceptable for scheduled surgery.  Bijan Ridgley M. Kolbi Tofte, MD, FACS [facility] Office: [phone]   ______ 

## 2014-07-18 ENCOUNTER — Encounter (HOSPITAL_COMMUNITY)
Admission: RE | Disposition: A | Discharge status: Home / Self Care | Payer: Self-pay | Source: Ambulatory Visit | Attending: Surgery

## 2014-07-18 ENCOUNTER — Encounter (HOSPITAL_COMMUNITY): Payer: Medicaid Other | Admitting: Anesthesiology

## 2014-07-18 ENCOUNTER — Ambulatory Visit (HOSPITAL_COMMUNITY): Payer: Medicaid Other | Admitting: Anesthesiology

## 2014-07-18 ENCOUNTER — Observation Stay (HOSPITAL_COMMUNITY)
Admission: RE | Admit: 2014-07-18 | Discharge: 2014-07-19 | Disposition: A | Discharge status: Home / Self Care | Payer: Medicaid Other | Source: Ambulatory Visit | Attending: Surgery | Admitting: Surgery

## 2014-07-18 ENCOUNTER — Encounter (HOSPITAL_COMMUNITY): Payer: Self-pay | Admitting: *Deleted

## 2014-07-18 DIAGNOSIS — J449 Chronic obstructive pulmonary disease, unspecified: Secondary | ICD-10-CM | POA: Insufficient documentation

## 2014-07-18 DIAGNOSIS — E7800 Pure hypercholesterolemia, unspecified: Secondary | ICD-10-CM | POA: Diagnosis not present

## 2014-07-18 DIAGNOSIS — K219 Gastro-esophageal reflux disease without esophagitis: Secondary | ICD-10-CM | POA: Insufficient documentation

## 2014-07-18 DIAGNOSIS — Z87442 Personal history of urinary calculi: Secondary | ICD-10-CM | POA: Insufficient documentation

## 2014-07-18 DIAGNOSIS — K430 Incisional hernia with obstruction, without gangrene: Principal | ICD-10-CM | POA: Diagnosis present

## 2014-07-18 DIAGNOSIS — Z87891 Personal history of nicotine dependence: Secondary | ICD-10-CM | POA: Insufficient documentation

## 2014-07-18 DIAGNOSIS — J4489 Other specified chronic obstructive pulmonary disease: Secondary | ICD-10-CM | POA: Diagnosis not present

## 2014-07-18 DIAGNOSIS — I10 Essential (primary) hypertension: Secondary | ICD-10-CM | POA: Diagnosis not present

## 2014-07-18 DIAGNOSIS — K432 Incisional hernia without obstruction or gangrene: Secondary | ICD-10-CM | POA: Diagnosis present

## 2014-07-18 HISTORY — PX: INCISIONAL HERNIA REPAIR: SHX193

## 2014-07-18 HISTORY — PX: INSERTION OF MESH: SHX5868

## 2014-07-18 SURGERY — REPAIR, HERNIA, INCISIONAL
Anesthesia: General

## 2014-07-18 MED ORDER — FENTANYL CITRATE 0.05 MG/ML IJ SOLN
INTRAMUSCULAR | Status: DC | PRN
  Administered 2014-07-18: 100 ug via INTRAVENOUS
  Administered 2014-07-18 (×3): 50 ug via INTRAVENOUS

## 2014-07-18 MED ORDER — ROCURONIUM BROMIDE 100 MG/10ML IV SOLN
INTRAVENOUS | Status: AC
  Filled 2014-07-18: qty 1

## 2014-07-18 MED ORDER — EPHEDRINE SULFATE 50 MG/ML IJ SOLN
INTRAMUSCULAR | Status: DC | PRN
  Administered 2014-07-18 (×2): 10 mg via INTRAVENOUS

## 2014-07-18 MED ORDER — HYDROMORPHONE HCL 1 MG/ML IJ SOLN: 1.0000 mg | INTRAMUSCULAR | Status: DC | PRN

## 2014-07-18 MED ORDER — ONDANSETRON HCL 4 MG/2ML IJ SOLN
INTRAMUSCULAR | Status: DC | PRN
  Administered 2014-07-18: 4 mg via INTRAVENOUS

## 2014-07-18 MED ORDER — CEFAZOLIN SODIUM-DEXTROSE 2-3 GM-% IV SOLR
INTRAVENOUS | Status: AC
  Filled 2014-07-18: qty 50

## 2014-07-18 MED ORDER — DEXAMETHASONE SODIUM PHOSPHATE 10 MG/ML IJ SOLN
INTRAMUSCULAR | Status: DC | PRN
  Administered 2014-07-18: 10 mg via INTRAVENOUS

## 2014-07-18 MED ORDER — CEFAZOLIN SODIUM-DEXTROSE 2-3 GM-% IV SOLR
2.0000 g | INTRAVENOUS | Status: AC
  Administered 2014-07-18: 2 g via INTRAVENOUS

## 2014-07-18 MED ORDER — PROPOFOL 10 MG/ML IV BOLUS
INTRAVENOUS | Status: DC | PRN
  Administered 2014-07-18: 180 mg via INTRAVENOUS

## 2014-07-18 MED ORDER — GLYCOPYRROLATE 0.2 MG/ML IJ SOLN
INTRAMUSCULAR | Status: AC
  Filled 2014-07-18: qty 3

## 2014-07-18 MED ORDER — SCOPOLAMINE 1 MG/3DAYS TD PT72
1.0000 | MEDICATED_PATCH | TRANSDERMAL | Status: DC
  Administered 2014-07-18: 1.5 mg via TRANSDERMAL

## 2014-07-18 MED ORDER — BUPIVACAINE-EPINEPHRINE (PF) 0.25% -1:200000 IJ SOLN
INTRAMUSCULAR | Status: AC
  Filled 2014-07-18: qty 30

## 2014-07-18 MED ORDER — SCOPOLAMINE 1 MG/3DAYS TD PT72
MEDICATED_PATCH | TRANSDERMAL | Status: AC
  Filled 2014-07-18: qty 1

## 2014-07-18 MED ORDER — GLYCOPYRROLATE 0.2 MG/ML IJ SOLN
INTRAMUSCULAR | Status: DC | PRN
  Administered 2014-07-18: 0.6 mg via INTRAVENOUS

## 2014-07-18 MED ORDER — LIDOCAINE HCL (CARDIAC) 20 MG/ML IV SOLN
INTRAVENOUS | Status: DC | PRN
  Administered 2014-07-18: 50 mg via INTRAVENOUS

## 2014-07-18 MED ORDER — DEXAMETHASONE SODIUM PHOSPHATE 10 MG/ML IJ SOLN
INTRAMUSCULAR | Status: AC
  Filled 2014-07-18: qty 1

## 2014-07-18 MED ORDER — ONDANSETRON HCL 4 MG/2ML IJ SOLN
INTRAMUSCULAR | Status: AC
Start: 2014-07-18 — End: 2014-07-18
  Filled 2014-07-18: qty 2

## 2014-07-18 MED ORDER — NEOSTIGMINE METHYLSULFATE 10 MG/10ML IV SOLN
INTRAVENOUS | Status: AC
  Filled 2014-07-18: qty 1

## 2014-07-18 MED ORDER — FENTANYL CITRATE 0.05 MG/ML IJ SOLN
25.0000 ug | INTRAMUSCULAR | Status: DC | PRN
  Administered 2014-07-18 (×2): 25 ug via INTRAVENOUS

## 2014-07-18 MED ORDER — BUPIVACAINE-EPINEPHRINE 0.25% -1:200000 IJ SOLN
INTRAMUSCULAR | Status: DC | PRN
  Administered 2014-07-18: 10 mL

## 2014-07-18 MED ORDER — NEOSTIGMINE METHYLSULFATE 10 MG/10ML IV SOLN
INTRAVENOUS | Status: DC | PRN
  Administered 2014-07-18: 5 mg via INTRAVENOUS

## 2014-07-18 MED ORDER — 0.9 % SODIUM CHLORIDE (POUR BTL) OPTIME
TOPICAL | Status: DC | PRN
  Administered 2014-07-18: 1000 mL

## 2014-07-18 MED ORDER — HYDROCODONE-ACETAMINOPHEN 5-325 MG PO TABS: 1.0000 | ORAL_TABLET | ORAL | Status: DC | PRN

## 2014-07-18 MED ORDER — PROPOFOL 10 MG/ML IV BOLUS
INTRAVENOUS | Status: AC
  Filled 2014-07-18: qty 20

## 2014-07-18 MED ORDER — ONDANSETRON HCL 4 MG/2ML IJ SOLN: 4.0000 mg | Freq: Four times a day (QID) | INTRAMUSCULAR | Status: DC | PRN

## 2014-07-18 MED ORDER — FENTANYL CITRATE 0.05 MG/ML IJ SOLN
INTRAMUSCULAR | Status: AC
  Filled 2014-07-18: qty 5

## 2014-07-18 MED ORDER — LIDOCAINE HCL (CARDIAC) 20 MG/ML IV SOLN
INTRAVENOUS | Status: AC
  Filled 2014-07-18: qty 5

## 2014-07-18 MED ORDER — SODIUM CHLORIDE 0.9 % IJ SOLN
INTRAMUSCULAR | Status: AC
  Filled 2014-07-18: qty 10

## 2014-07-18 MED ORDER — FENTANYL CITRATE 0.05 MG/ML IJ SOLN
INTRAMUSCULAR | Status: AC
  Filled 2014-07-18: qty 2

## 2014-07-18 MED ORDER — EPHEDRINE SULFATE 50 MG/ML IJ SOLN
INTRAMUSCULAR | Status: AC
  Filled 2014-07-18: qty 1

## 2014-07-18 MED ORDER — ONDANSETRON HCL 4 MG PO TABS: 4.0000 mg | ORAL_TABLET | Freq: Four times a day (QID) | ORAL | Status: DC | PRN

## 2014-07-18 MED ORDER — MEPERIDINE HCL 50 MG/ML IJ SOLN: 6.2500 mg | INTRAMUSCULAR | Status: DC | PRN

## 2014-07-18 MED ORDER — PROMETHAZINE HCL 25 MG/ML IJ SOLN
6.2500 mg | INTRAMUSCULAR | Status: DC | PRN
Start: 2014-07-18 — End: 2014-07-18

## 2014-07-18 MED ORDER — LACTATED RINGERS IV SOLN
INTRAVENOUS | Status: DC
  Administered 2014-07-18: 1000 mL via INTRAVENOUS
  Administered 2014-07-18: 11:00:00 via INTRAVENOUS
  Administered 2014-07-18: 1000 mL via INTRAVENOUS

## 2014-07-18 MED ORDER — KCL IN DEXTROSE-NACL 30-5-0.45 MEQ/L-%-% IV SOLN
INTRAVENOUS | Status: DC
  Administered 2014-07-18: 14:00:00 via INTRAVENOUS
  Filled 2014-07-18 (×3): qty 1000

## 2014-07-18 MED ORDER — ROCURONIUM BROMIDE 100 MG/10ML IV SOLN
INTRAVENOUS | Status: DC | PRN
  Administered 2014-07-18: 5 mg via INTRAVENOUS
  Administered 2014-07-18: 25 mg via INTRAVENOUS

## 2014-07-18 MED ORDER — ACETAMINOPHEN 325 MG PO TABS
650.0000 mg | ORAL_TABLET | ORAL | Status: DC | PRN
  Administered 2014-07-18: 650 mg via ORAL
  Filled 2014-07-18: qty 2

## 2014-07-18 MED ORDER — SUCCINYLCHOLINE CHLORIDE 20 MG/ML IJ SOLN
INTRAMUSCULAR | Status: DC | PRN
  Administered 2014-07-18: 100 mg via INTRAVENOUS

## 2014-07-18 SURGICAL SUPPLY — 31 items
APL SKNCLS STERI-STRIP NONHPOA (GAUZE/BANDAGES/DRESSINGS) ×1
BENZOIN TINCTURE PRP APPL 2/3 (GAUZE/BANDAGES/DRESSINGS) ×1 IMPLANT
BINDER ABDOMINAL 12 ML 46-62 (SOFTGOODS) IMPLANT
BLADE HEX COATED 2.75 (ELECTRODE) ×2 IMPLANT
CANISTER SUCTION 2500CC (MISCELLANEOUS) ×2 IMPLANT
DRAPE LAPAROSCOPIC ABDOMINAL (DRAPES) ×2 IMPLANT
ELECT REM PT RETURN 9FT ADLT (ELECTROSURGICAL) ×2
ELECTRODE REM PT RTRN 9FT ADLT (ELECTROSURGICAL) ×1 IMPLANT
GAUZE SPONGE 4X4 12PLY STRL (GAUZE/BANDAGES/DRESSINGS) ×2 IMPLANT
GLOVE BIOGEL PI IND STRL 7.0 (GLOVE) ×1 IMPLANT
GLOVE BIOGEL PI INDICATOR 7.0 (GLOVE) ×1
GLOVE SURG ORTHO 8.0 STRL STRW (GLOVE) ×2 IMPLANT
GLOVE SURG SS PI 7.5 STRL IVOR (GLOVE) ×1 IMPLANT
GOWN STRL REUS W/TWL LRG LVL3 (GOWN DISPOSABLE) ×2 IMPLANT
GOWN STRL REUS W/TWL XL LVL3 (GOWN DISPOSABLE) ×4 IMPLANT
KIT BASIN OR (CUSTOM PROCEDURE TRAY) ×2 IMPLANT
MESH VENTRALEX ST 2.5 CRC MED (Mesh General) ×1 IMPLANT
NS IRRIG 1000ML POUR BTL (IV SOLUTION) ×2 IMPLANT
PACK GENERAL/GYN (CUSTOM PROCEDURE TRAY) ×2 IMPLANT
STAPLER VISISTAT 35W (STAPLE) ×2 IMPLANT
STRIP CLOSURE SKIN 1/2X4 (GAUZE/BANDAGES/DRESSINGS) ×1 IMPLANT
SUT NOVA 0 T19/GS 22DT (SUTURE) ×2 IMPLANT
SUT NOVA 1 T20/GS 25DT (SUTURE) IMPLANT
SUT NOVA NAB GS-21 0 18 T12 DT (SUTURE) IMPLANT
SUT PDS AB 1 CTX 36 (SUTURE) ×4 IMPLANT
SUT VIC AB 2-0 CT1 27 (SUTURE) ×2
SUT VIC AB 2-0 CT1 TAPERPNT 27 (SUTURE) IMPLANT
SUT VIC AB 2-0 CT2 27 (SUTURE) ×2 IMPLANT
TAPE CLOTH SURG 4X10 WHT LF (GAUZE/BANDAGES/DRESSINGS) ×1 IMPLANT
TOWEL OR 17X26 10 PK STRL BLUE (TOWEL DISPOSABLE) ×2 IMPLANT
TRAY FOLEY CATH 14FRSI W/METER (CATHETERS) ×2 IMPLANT

## 2014-07-18 NOTE — Anesthesia Postprocedure Evaluation (Signed)
  Anesthesia Post-op Note  Patient: Brooke Mora  Procedure(s) Performed: Procedure(s): REPAIR OF INACERATED INCISIONAL HERNIA WITH MESH (N/A) INSERTION OF MESH (N/A)  Patient Location: PACU  Anesthesia Type:General  Level of Consciousness: awake, alert  and oriented  Airway and Oxygen Therapy: Patient Spontanous Breathing and Patient connected to nasal cannula oxygen  Post-op Pain: mild  Post-op Assessment: Post-op Vital signs reviewed, Patient's Cardiovascular Status Stable, Respiratory Function Stable and No signs of Nausea or vomiting  Post-op Vital Signs: Reviewed and stable  Last Vitals:  Filed Vitals:   07/18/14 1215  BP: 133/59  Pulse: 63  Temp:   Resp: 14    Complications: No apparent anesthesia complications

## 2014-07-18 NOTE — H&P (Signed)
Brooke Mora is an 80 y.o. female.    Chief Complaint: incarcerated ventral incisional hernia  HPI: Patient is an 80 year old female referred by her primary care physician for evaluation of abdominal wall hernia. Patient has a history of previous hernia repair performed over 10 years ago in [place], [place]. Details of that procedure are not available.  Patient speaks limited [place]. She is accompanied by her granddaughter who acts as Optometrist and is fluent in [place] and [place].  Patient notes that the hernia recurred over 3 years ago. It has gradually increased in size. It now causes some discomfort. She denies any signs or symptoms of obstruction.  Previous abdominal surgery includes hysterectomy and a right inguinal hernia repair. Patient does not know whether mesh was used in the hernia repair or not.  Past Medical History  Diagnosis Date  . Kidney stones   . GERD (gastroesophageal reflux disease)   . High cholesterol   . COPD (chronic obstructive pulmonary disease)     slight   . Hypertension     patient states b/p up and down not on meds    Past Surgical History  Procedure Laterality Date  . Hernia repair    . Knee surgery    . Abdominal hysterectomy    . Cataract extraction      History reviewed. No pertinent family history. Social History:  reports that she has quit smoking. She has never used smokeless tobacco. She reports that she does not drink alcohol or use illicit drugs.  Allergies:  Allergies  Allergen Reactions  . Nabumetone Itching and Swelling    Medications Prior to Admission  Medication Sig Dispense Refill  . HYDROcodone-acetaminophen (NORCO/VICODIN) 5-325 MG per tablet Take 1 tablet by mouth every 6 (six) hours as needed for moderate pain.      Marland Kitchen ibuprofen (ADVIL,MOTRIN) 200 MG tablet Take 200-400 mg by mouth every 6 (six) hours as needed for moderate pain.        No results found for this or any previous visit (from the past 48  hour(s)). No results found.  Review of Systems  Constitutional: Negative.   HENT: Negative.   Eyes: Negative.   Respiratory: Negative.   Cardiovascular: Negative.   Gastrointestinal: Positive for abdominal pain. Negative for nausea and vomiting.  Genitourinary: Negative.   Musculoskeletal: Negative.   Skin: Negative.   Neurological: Negative.   Endo/Heme/Allergies: Negative.   Psychiatric/Behavioral: Negative.     Blood pressure 161/77, pulse 74, temperature 97.8 F (36.6 C), temperature source Oral, resp. rate 18, SpO2 93.00%. Physical Exam  Constitutional: She is oriented to person, place, and time. She appears well-developed and well-nourished. No distress.  HENT:  Head: Normocephalic and atraumatic.  Right Ear: External ear normal.  Left Ear: External ear normal.  Eyes: Conjunctivae are normal. Pupils are equal, round, and reactive to light. No scleral icterus.  Neck: Normal range of motion. Neck supple. No thyromegaly present.  Cardiovascular: Normal rate, regular rhythm and normal heart sounds.   Respiratory: Effort normal and breath sounds normal.  GI: Soft. Bowel sounds are normal. She exhibits no distension. There is tenderness. There is no rebound and no guarding.  Upper midline mass consistent with incarcerated incisional hernia  Musculoskeletal: Normal range of motion. She exhibits no edema.  Neurological: She is alert and oriented to person, place, and time.  Skin: Skin is warm and dry.  Psychiatric: She has a normal mood and affect. Her behavior is normal.     Assessment/Plan Ventral  incisional hernia, incarcerated  Plan open repair with mesh  The risks and benefits of the procedure have been discussed at length with the patient.  The patient understands the proposed procedure, potential alternative treatments, and the course of recovery to be expected.  All of the patient's questions have been answered at this time.  The patient wishes to proceed with  surgery.  Earnstine Regal, MD, [facility] Office: [place] 07/18/2014, 10:29 AM

## 2014-07-18 NOTE — Anesthesia Preprocedure Evaluation (Addendum)
Anesthesia Evaluation  Patient identified by MRN, date of birth, ID band  Reviewed: Allergy & Precautions, H&P , NPO status , Patient's Chart, lab work & pertinent test results  Airway Mallampati: II  Neck ROM: Full    Dental  (+) Edentulous Upper, Edentulous Lower   Pulmonary COPDformer smoker,  breath sounds clear to auscultation        Cardiovascular hypertension, Pt. on medications Rhythm:Regular Rate:Normal     Neuro/Psych    GI/Hepatic GERD-  ,  Endo/Other    Renal/GU GFR 70     Musculoskeletal   Abdominal (+)  Abdomen: soft.    Peds  Hematology   Anesthesia Other Findings   Reproductive/Obstetrics                          Anesthesia Physical Anesthesia Plan  ASA: III  Anesthesia Plan: General   Post-op Pain Management:    Induction: Intravenous  Airway Management Planned: Oral ETT  Additional Equipment:   Intra-op Plan:   Post-operative Plan:   Informed Consent: I have reviewed the patients History and Physical, chart, labs and discussed the procedure including the risks, benefits and alternatives for the proposed anesthesia with the patient or authorized representative who has indicated his/her understanding and acceptance.     Plan Discussed with:   Anesthesia Plan Comments:         Anesthesia Quick Evaluation

## 2014-07-18 NOTE — Brief Op Note (Signed)
07/18/2014  12:03 PM  PATIENT:  Brooke Mora  80 y.o. female  PRE-OPERATIVE DIAGNOSIS:  Incarcerated Incisional Hernia  POST-OPERATIVE DIAGNOSIS:  Incarcerated Incisional Hernia  PROCEDURE:  Procedure(s): REPAIR OF INACERATED INCISIONAL HERNIA WITH MESH (N/A) INSERTION OF MESH (N/A)  SURGEON:  Surgeon(s) and Role:    * Armandina Gemma, MD - Primary    * Alphonsa Overall, MD  ANESTHESIA:   general  EBL:  Total I/O In: 1000 [I.V.:1000] Out: -   BLOOD ADMINISTERED:none  DRAINS: none   LOCAL MEDICATIONS USED:  MARCAINE     SPECIMEN:  No Specimen  DISPOSITION OF SPECIMEN:  N/A  COUNTS:  YES  TOURNIQUET:  * No tourniquets in log *  DICTATION: .Other Dictation: Dictation Number [id number]  PLAN OF CARE: Admit for overnight observation  PATIENT DISPOSITION:  PACU - hemodynamically stable.   Delay start of Pharmacological VTE agent (>24hrs) due to surgical blood loss or risk of bleeding: yes  Earnstine Regal, MD, [facility] Office: [phone]

## 2014-07-18 NOTE — Transfer of Care (Signed)
Immediate Anesthesia Transfer of Care Note  Patient: Brooke Mora  Procedure(s) Performed: Procedure(s) (LRB): REPAIR OF INACERATED INCISIONAL HERNIA WITH MESH (N/A) INSERTION OF MESH (N/A)  Patient Location: PACU  Anesthesia Type: General  Level of Consciousness: sedated, patient cooperative and responds to stimulation  Airway & Oxygen Therapy: Patient Spontanous Breathing and Patient connected to face mask oxgen  Post-op Assessment: Report given to PACU RN and Post -op Vital signs reviewed and stable  Post vital signs: Reviewed and stable  Complications: No apparent anesthesia complications

## 2014-07-19 ENCOUNTER — Encounter (HOSPITAL_COMMUNITY): Payer: Self-pay | Admitting: Surgery

## 2014-07-19 DIAGNOSIS — K430 Incisional hernia with obstruction, without gangrene: Secondary | ICD-10-CM | POA: Diagnosis not present

## 2014-07-19 MED ORDER — HYDROCODONE-ACETAMINOPHEN 5-325 MG PO TABS: 1.0000 | ORAL_TABLET | ORAL | Status: DC | PRN

## 2014-07-19 NOTE — Plan of Care (Signed)
Problem: Phase I Progression Outcomes Goal: OOB as tolerated unless otherwise ordered Outcome: Progressing oob with assist in room to [facility].

## 2014-07-19 NOTE — Progress Notes (Signed)
UR completed 

## 2014-07-19 NOTE — Discharge Summary (Signed)
Physician Discharge Summary [facility]  Patient ID: Brooke Mora MRN: [id number] DOB/AGE: 1929/03/28 80 y.o.  Admit date: 07/18/2014 Discharge date: 07/19/2014  Admission Diagnoses:  Ventral incisional hernia  Discharge Diagnoses:  Principal Problem:   Incisional hernia, without obstruction or gangrene, incarcerated Active Problems:   Incisional hernia, incarcerated   Discharged Condition: good  Hospital Course: patient admitted for observation after hernia repair with mesh.  Post op course uneventful.  Pain well controlled.  Prepared for discharge on POD#1.  Consults: None  Treatments: surgery: ventral incisional hernia repair with mesh patch  Discharge Exam: Blood pressure 126/68, pulse 61, temperature 98.5 F (36.9 C), temperature source Oral, resp. rate 18, height 5\' 4"  (1.626 m), weight 182 lb (82.555 kg), SpO2 96.00%. HEENT - clear Neck - soft Chest - clear bilaterally Cor - RRR Abd - dressing dry and intact  Disposition: Home  Discharge Instructions   Diet - low sodium heart healthy    Complete by:  As directed      Discharge instructions    Complete by:  As directed   [facility], [place]  Always review your discharge instruction sheet given to you by the facility where your surgery was performed.  A  prescription for pain medication may be given to you upon discharge.  Take your pain medication as prescribed.  If narcotic pain medicine is not needed, then you may take acetaminophen (Tylenol) or ibuprofen (Advil) as needed.  Take your usually prescribed medications unless otherwise directed.  If you need a refill on your pain medication, please contact your pharmacy.  They will contact our office to request authorization. Prescriptions will not be filled after 5 pm daily or on weekends.  You should follow a light diet the first 24 hours after arrival home, such as soup and crackers or toast.   Be sure to include plenty of fluids daily.  Resume your normal diet the day after surgery.  Most patients will experience some swelling and bruising around the surgical site.  Ice packs and reclining will help.  Swelling and bruising can take several days to resolve.   It is common to experience some constipation if taking pain medication after surgery.  Increasing fluid intake and taking a stool softener (such as Colace) will usually help or prevent this problem from occurring.  A mild laxative (Milk of Magnesia or Miralax) should be taken according to package directions if there are no bowel movements after 48 hours.  Unless discharge instructions indicate otherwise, you may remove your bandages 24-48 hours after surgery, and you may shower at that time.  You may have steri-strips (small skin tapes) in place directly over the incision.  These strips should be left on the skin for 7-10 days.  If your surgeon used skin glue on the incision, you may shower in 24 hours.  The glue will flake off over the next 2-3 weeks.  Any sutures or staples will be removed at the office during your follow-up visit.  ACTIVITIES:  You may resume regular (light) daily activities beginning the next day-such as daily self-care, walking, climbing stairs-gradually increasing activities as tolerated.  You may have sexual intercourse when it is comfortable.  Refrain from any heavy lifting or straining until approved by your doctor.  You may drive when you are no longer taking prescription pain medication, you can comfortably wear a seatbelt, and you can safely maneuver your car and apply brakes.  You should see your doctor in the office for a follow-up appointment approximately 2-3 weeks after your surgery.  Make sure that you call for this appointment within a day or two after you arrive home to insure a convenient appointment time.   WHEN TO CALL YOUR DOCTOR: Fever greater than 101.0 Inability to urinate Persistent nausea  and/or vomiting Extreme swelling or bruising Continued bleeding from incision Increased pain, redness, or drainage from the incision  The clinic staff is available to answer your questions during regular business hours.  Please don't hesitate to call and ask to speak to one of the nurses for clinical concerns.  If you have a medical emergency, go to the nearest emergency room or call 911.  A surgeon from [facility] is always on call for the hospital.   [facility] [address] [place], [place], [place]  [address]  [phone] ? [phone] ? FAX [phone]  [email or web address]     Increase activity slowly    Complete by:  As directed      Remove dressing in 24 hours    Complete by:  As directed             Medication List         HYDROcodone-acetaminophen 5-325 MG per tablet  Commonly known as:  NORCO/VICODIN  Take 1 tablet by mouth every 6 (six) hours as needed for moderate pain.     HYDROcodone-acetaminophen 5-325 MG per tablet  Commonly known as:  NORCO/VICODIN  Take 1-2 tablets by mouth every 4 (four) hours as needed for moderate pain.     ibuprofen 200 MG tablet  Commonly known as:  ADVIL,MOTRIN  Take 200-400 mg by mouth every 6 (six) hours as needed for moderate pain.           Follow-up Information   Follow up with Earnstine Regal, MD. Schedule an appointment as soon as possible for a visit in 3 weeks. (For wound re-check)    Specialty:  General Surgery   Contact information:   [address] [place] [address] [phone]       Earnstine Regal, MD, [facility] Office: [phone]   Signed: Earnstine Regal 07/19/2014, 7:59 AM

## 2014-07-19 NOTE — Op Note (Signed)
[name], BROADEN NO.:  [id number]  MEDICAL RECORD NO.:  [id number]  LOCATION:  51                         FACILITY:  [facility]  PHYSICIAN:  Earnstine Regal, MD      DATE OF BIRTH:  Jul 04, 1929  DATE OF PROCEDURE:  07/18/2014                              OPERATIVE REPORT   PREOPERATIVE DIAGNOSIS:  Incarcerated ventral incisional hernia.  POSTOPERATIVE DIAGNOSIS:  Incarcerated ventral incisional hernia.  PROCEDURE:  Repair incarcerated ventral incisional hernia with mesh patch.  SURGEON:  Earnstine Regal, MD, FACS  ASSISTANT:  Fenton Malling. Lucia Gaskins, MD, FACS  ANESTHESIA:  General.  ESTIMATED BLOOD LOSS:  Minimal.  PREPARATION:  ChloraPrep.  COMPLICATIONS:  None.  INDICATIONS:  The patient is an 80 year old female from [place], [place].  She underwent repair of hernia in the upper midline of the abdomen approximately 10 years ago in [place].  Details of the procedure are not available.  The hernia recurred approximately 3 years ago and has gradually increased in size.  She now comes to Surgery for repair.  BODY OF REPORT:  Procedure was done in OR #2 at the [facility].  The patient was brought to the operating room, placed in supine position on the operating room table.  Following administration of general anesthesia, the patient was positioned and then prepped and draped in the usual aseptic fashion.  After ascertaining that an adequate level of anesthesia had been achieved, the previous surgical incision in the upper midline of the abdomen is excised with an elliptical incision made with a #10 blade.  Dissection was carried into subcutaneous tissues.  The previous skin and scar was excised and discarded.  Dissection was carried into subcutaneous tissues where hernia sac is identified.  This appears to contain incarcerated omentum.  The hernia sac is opened.  There was no bowel present.  The hernia sac is excised in its entirety.   The thickest portion of the hernia sac was divided between hemostats and suture ligated with 2-0 Vicryl suture ligature.  Good hemostasis was noted.  Palpation in the preperitoneal space shows no other adjacent fascial defects.  There was some attenuation of the upper midline fascia consistent with diastasis. The actual fascial defect measures about 2 cm in diameter.  A Ventralex ST 6.4 cm mesh patch was selected for repair.  It was brought on the field and prepared with saline.  It was folded and inserted into the preperitoneal space where it was deployed circumferentially.  The mesh patch was then secured to the fascial edges with interrupted 0 Novafil simple sutures circumferentially.  This closes the fascial defect with a broad overlap of mesh circumferentially.  Subcutaneous tissues were closed with interrupted 2-0 Vicryl sutures. Skin was anesthetized with local anesthetic.  Skin edges were reapproximated with a running 4-0 Monocryl subcuticular suture.  Wound was washed and dried and benzoin and Steri-Strips were applied.  Sterile dressings were applied.  The patient was awakened from anesthesia and brought to the recovery room.  The patient tolerated the procedure well.   Earnstine Regal, MD, [facility] Office: [phone]     TMG/MEDQ  D:  07/18/2014  T:  07/19/2014  Job:  [id number]

## 2014-07-19 NOTE — Progress Notes (Signed)
Pt discharged home with daughter and granddaughter in stable condition. Discharge instructions and script given. Pt and family verbalized understanding.

## 2014-10-25 ENCOUNTER — Emergency Department (HOSPITAL_BASED_OUTPATIENT_CLINIC_OR_DEPARTMENT_OTHER)
Admission: EM | Admit: 2014-10-25 | Discharge: 2014-10-25 | Disposition: A | Discharge status: Home / Self Care | Payer: Medicaid Other | Attending: Emergency Medicine | Admitting: Emergency Medicine

## 2014-10-25 ENCOUNTER — Encounter (HOSPITAL_BASED_OUTPATIENT_CLINIC_OR_DEPARTMENT_OTHER): Payer: Self-pay | Admitting: Emergency Medicine

## 2014-10-25 ENCOUNTER — Emergency Department (HOSPITAL_BASED_OUTPATIENT_CLINIC_OR_DEPARTMENT_OTHER): Payer: Medicaid Other

## 2014-10-25 DIAGNOSIS — Z90710 Acquired absence of both cervix and uterus: Secondary | ICD-10-CM | POA: Insufficient documentation

## 2014-10-25 DIAGNOSIS — Z87891 Personal history of nicotine dependence: Secondary | ICD-10-CM | POA: Diagnosis not present

## 2014-10-25 DIAGNOSIS — Z8639 Personal history of other endocrine, nutritional and metabolic disease: Secondary | ICD-10-CM | POA: Insufficient documentation

## 2014-10-25 DIAGNOSIS — Z87442 Personal history of urinary calculi: Secondary | ICD-10-CM | POA: Insufficient documentation

## 2014-10-25 DIAGNOSIS — N390 Urinary tract infection, site not specified: Secondary | ICD-10-CM | POA: Diagnosis not present

## 2014-10-25 DIAGNOSIS — R109 Unspecified abdominal pain: Secondary | ICD-10-CM | POA: Diagnosis present

## 2014-10-25 DIAGNOSIS — J449 Chronic obstructive pulmonary disease, unspecified: Secondary | ICD-10-CM | POA: Insufficient documentation

## 2014-10-25 DIAGNOSIS — Z8719 Personal history of other diseases of the digestive system: Secondary | ICD-10-CM | POA: Diagnosis not present

## 2014-10-25 DIAGNOSIS — I10 Essential (primary) hypertension: Secondary | ICD-10-CM | POA: Insufficient documentation

## 2014-10-25 LAB — URINALYSIS, ROUTINE W REFLEX MICROSCOPIC
Bilirubin Urine: NEGATIVE
Glucose, UA: NEGATIVE mg/dL
Ketones, ur: NEGATIVE mg/dL
Leukocytes, UA: NEGATIVE
Nitrite: NEGATIVE
Protein, ur: NEGATIVE mg/dL
Specific Gravity, Urine: 1.021 (ref 1.005–1.030)
Urobilinogen, UA: 1 mg/dL (ref 0.0–1.0)
pH: 5.5 (ref 5.0–8.0)

## 2014-10-25 LAB — URINE MICROSCOPIC-ADD ON

## 2014-10-25 MED ORDER — FENTANYL CITRATE 0.05 MG/ML IJ SOLN
50.0000 ug | Freq: Once | INTRAMUSCULAR | Status: AC
  Administered 2014-10-25: 50 ug via INTRAVENOUS
  Filled 2014-10-25: qty 2

## 2014-10-25 MED ORDER — HYDROCODONE-ACETAMINOPHEN 5-325 MG PO TABS: 2.0000 | ORAL_TABLET | ORAL | Status: DC | PRN

## 2014-10-25 MED ORDER — NITROFURANTOIN MACROCRYSTAL 100 MG PO CAPS: 100.0000 mg | ORAL_CAPSULE | Freq: Four times a day (QID) | ORAL | Status: DC

## 2014-10-25 NOTE — Discharge Instructions (Signed)
Dolor en el flanco  (Flank Pain) El dolor en el flanco se refiere a aquel dolor que se localiza en un lado del cuerpo, entre la zona superior del abdomen y Counsellor. Puede aparecer en un perodo corto de tiempo (agudo) o puede durar mucho tiempo o ser recurrente (crnico). Puede ser leve o intenso. Puede tener diferentes causas.  CAUSAS  Algunas de las causas ms comunes son:   Esguince muscular.   Espasmos musculares.   Problemas en la columna vertebral (enfermedad de los discos).   Infeccin en el pulmn (neumona).   Lquido en los pulmones (edema pulmonar).   Infeccin renal.   Piedras en el rin.   Una erupcin cutnea muy dolorosa causada por el virus de la varicela (herpes zoster).  Enfermedad de la vescula biliar. INSTRUCCIONES PARA EL CUIDADO EN EL HOGAR  Los cuidados en el hogar dependern de la causa del dolor. En general:   Haga reposo segn las indicaciones del mdico.  Debe ingerir gran cantidad de lquido para mantener la orina de tono claro o color amarillo plido.  Tome slo medicamentos de venta libre o recetados, segn las indicaciones del mdico. Algunos medicamentos ayudan a Best boy.  Informe al mdico si tiene algn Risk manager.  Concurra a las visitas de control, segn las indicaciones. SOLICITE ATENCIN MDICA DE INMEDIATO SI:   El dolor no se alivia con los [employer].   Tiene sntomas nuevos o que empeoran.  El dolor [place].   Siente dolor abdominal.   Le falta el aire.   Tiene nuseas o vmitos persistentes.   El abdomen se hincha.   Sufre mareos o se desmaya.   Observa sangre en la orina.  Tiene fiebre o sntomas persistentes durante ms de 2  3 das.  Tiene fiebre y los sntomas empeoran repentinamente. ASEGRESE DE QUE:   Comprende estas instrucciones.  Controlar su enfermedad.  Solicitar ayuda de inmediato si no mejora o si empeora. Document Released: 01/20/2008 Document Revised:  07/07/2012 [facility] Patient Information 2015 [place]. This information is not intended to replace advice given to you by your health care provider. Make sure you discuss any questions you have with your health care provider.

## 2014-10-25 NOTE — ED Notes (Signed)
Pt having left flank pain for three days.  No fever, no N/V.  No dysuria or blood in urine.  Last ct for kidney stones was three years ago.

## 2014-10-25 NOTE — ED Provider Notes (Signed)
CSN: [id number]     Arrival date & time 10/25/14  1552 History   First MD Initiated Contact with Patient 10/25/14 1714     Chief Complaint  Patient presents with  . Flank Pain      Patient is a 80 y.o. female presenting with flank pain. The history is provided by a relative. The history is limited by a language barrier.  Flank Pain This is a recurrent problem. The problem occurs constantly. Pertinent negatives include no chest pain, no abdominal pain, no headaches and no shortness of breath. Nothing aggravates the symptoms. Nothing relieves the symptoms. She has tried nothing for the symptoms.   has previous history of urinary tract infections and kidney stones  Past Medical History  Diagnosis Date  . Kidney stones   . GERD (gastroesophageal reflux disease)   . High cholesterol   . COPD (chronic obstructive pulmonary disease)     slight   . Hypertension     patient states b/p up and down not on meds   Past Surgical History  Procedure Laterality Date  . Hernia repair    . Knee surgery    . Abdominal hysterectomy    . Cataract extraction    . Incisional hernia repair N/A 07/18/2014    Procedure: REPAIR OF INACERATED INCISIONAL HERNIA WITH MESH;  Surgeon: Armandina Gemma, MD;  Location: [facility];  Service: General;  Laterality: N/A;  . Insertion of mesh N/A 07/18/2014    Procedure: INSERTION OF MESH;  Surgeon: Armandina Gemma, MD;  Location: [facility];  Service: General;  Laterality: N/A;   No family history on file. History  Substance Use Topics  . Smoking status: Former Research scientist (life sciences)  . Smokeless tobacco: Never Used  . Alcohol Use: No   OB History    No data available     Review of Systems  Unable to perform ROS: Other  Respiratory: Negative for shortness of breath.   Cardiovascular: Negative for chest pain.  Gastrointestinal: Negative for abdominal pain.  Genitourinary: Positive for flank pain.  Neurological: Negative for headaches.      Allergies  Nabumetone  Home Medications    Prior to Admission medications   Medication Sig Start Date End Date Taking? Authorizing Provider  ibuprofen (ADVIL,MOTRIN) 200 MG tablet Take 200-400 mg by mouth every 6 (six) hours as needed for moderate pain.   Yes Historical Provider, MD  HYDROcodone-acetaminophen (NORCO/VICODIN) 5-325 MG per tablet Take 2 tablets by mouth every 4 (four) hours as needed. 10/25/14   Dot Lanes, MD  nitrofurantoin (MACRODANTIN) 100 MG capsule Take 1 capsule (100 mg total) by mouth 4 (four) times daily. 10/25/14   Dot Lanes, MD   BP 140/62 mmHg  Pulse 72  Temp(Src) 98.4 F (36.9 C) (Oral)  Resp 20  Ht 5\' 6"  (1.676 m)  Wt 172 lb (78.019 kg)  BMI 27.77 kg/m2  SpO2 95% Physical Exam Physical Exam  Nursing note and vitals reviewed. Constitutional: She is oriented to person, place, and time. She appears well-developed and well-nourished. No distress.  HENT:  Head: Normocephalic and atraumatic.  Eyes: Pupils are equal, round, and reactive to light.  Neck: Normal range of motion.  Cardiovascular: Normal rate and intact distal pulses.   Pulmonary/Chest: No respiratory distress.  Abdominal: Normal appearance. She exhibits no distension.  Musculoskeletal: Normal range of motion.  Neurological: She is alert and oriented to person, place, and time. No cranial nerve deficit.  Skin: Skin is warm and dry. No rash noted.  Psychiatric: She has a normal mood and affect. Her behavior is normal.   ED Course  Procedures (including critical care time) Labs Review Labs Reviewed  URINALYSIS, ROUTINE W REFLEX MICROSCOPIC - Abnormal; Notable for the following:    APPearance CLOUDY (*)    Hgb urine dipstick SMALL (*)    All other components within normal limits  URINE MICROSCOPIC-ADD ON - Abnormal; Notable for the following:    Squamous Epithelial / LPF MANY (*)    Bacteria, UA MANY (*)    All other components within normal limits    Imaging Review Ct Renal Stone Study  10/25/2014   CLINICAL DATA:   Patient states she has had left flank pain x 3 days. Denies any other symptoms. Hx of kidney stones, hernia repair, gerd and hysterectomy. Previous scan on 10/05/12.  EXAM: CT ABDOMEN AND PELVIS WITHOUT CONTRAST  TECHNIQUE: Multidetector CT imaging of the abdomen and pelvis was performed following the standard protocol without IV contrast.  COMPARISON:  10/05/2012  FINDINGS: Lower chest: Mild atelectasis at the right lung base. Mild cardiomegaly with left eventration the laterally. Right coronary artery atherosclerosis.  Hepatobiliary: Hepatic steatosis with left hepatic lobe low-density lesions, consistent with cysts. Hepatomegaly, 19.5 cm craniocaudal. Subtle irregular hepatic capsule, including on image 22 of series 2. Cholelithiasis without acute cholecystitis or biliary ductal dilatation.  Pancreas: Fatty atrophy involving the pancreatic head and uncinate process. No ductal dilatation or acute pancreatitis.  Spleen: Normal  Adrenals/Urinary Tract: Normal adrenal glands. No renal calculi or hydronephrosis. No hydroureter or ureteric calculi. No bladder calculi.  Stomach/Bowel: Normal stomach, without wall thickening. Extensive colonic diverticulosis, without evidence of diverticulitis. Normal terminal ileum and appendix. Normal small bowel.  Vascular/Lymphatic: Tortuosity of the upper abdominal aorta . No abdominopelvic adenopathy.  Reproductive: Hysterectomy.  No adnexal mass.  Other: No significant free fluid. Fat containing right inguinal hernia.  Musculoskeletal: Convex left lumbar spine curvature.  IMPRESSION: 1.  No urinary tract calculi or hydronephrosis. 2. Hepatic steatosis and hepatomegaly. Cannot exclude mild cirrhosis. Correlate with risk factors. 3. Cholelithiasis. 4.  Atherosclerosis, including within the coronary arteries.   Electronically Signed   By: Abigail Miyamoto M.D.   On: 10/25/2014 18:52      MDM   Final diagnoses:  Left flank pain  UTI (lower urinary tract infection)         Dot Lanes, MD 10/25/14 [phone]

## 2015-11-01 ENCOUNTER — Emergency Department (HOSPITAL_BASED_OUTPATIENT_CLINIC_OR_DEPARTMENT_OTHER): Payer: Medicaid Other

## 2015-11-01 ENCOUNTER — Encounter (HOSPITAL_BASED_OUTPATIENT_CLINIC_OR_DEPARTMENT_OTHER): Payer: Self-pay

## 2015-11-01 ENCOUNTER — Inpatient Hospital Stay (HOSPITAL_BASED_OUTPATIENT_CLINIC_OR_DEPARTMENT_OTHER)
Admission: EM | Admit: 2015-11-01 | Discharge: 2015-11-07 | DRG: 309 | Disposition: A | Discharge status: Home / Self Care | Payer: Medicaid Other | Attending: Cardiology | Admitting: Cardiology

## 2015-11-01 DIAGNOSIS — R778 Other specified abnormalities of plasma proteins: Secondary | ICD-10-CM | POA: Diagnosis present

## 2015-11-01 DIAGNOSIS — R42 Dizziness and giddiness: Secondary | ICD-10-CM | POA: Diagnosis present

## 2015-11-01 DIAGNOSIS — I248 Other forms of acute ischemic heart disease: Secondary | ICD-10-CM | POA: Diagnosis present

## 2015-11-01 DIAGNOSIS — K769 Liver disease, unspecified: Secondary | ICD-10-CM | POA: Diagnosis present

## 2015-11-01 DIAGNOSIS — I471 Supraventricular tachycardia, unspecified: Secondary | ICD-10-CM

## 2015-11-01 DIAGNOSIS — J449 Chronic obstructive pulmonary disease, unspecified: Secondary | ICD-10-CM | POA: Diagnosis present

## 2015-11-01 DIAGNOSIS — R079 Chest pain, unspecified: Secondary | ICD-10-CM | POA: Diagnosis not present

## 2015-11-01 DIAGNOSIS — R7989 Other specified abnormal findings of blood chemistry: Secondary | ICD-10-CM | POA: Diagnosis not present

## 2015-11-01 DIAGNOSIS — I10 Essential (primary) hypertension: Secondary | ICD-10-CM | POA: Diagnosis present

## 2015-11-01 DIAGNOSIS — Z87891 Personal history of nicotine dependence: Secondary | ICD-10-CM | POA: Diagnosis not present

## 2015-11-01 DIAGNOSIS — J984 Other disorders of lung: Secondary | ICD-10-CM

## 2015-11-01 DIAGNOSIS — R911 Solitary pulmonary nodule: Secondary | ICD-10-CM | POA: Diagnosis present

## 2015-11-01 DIAGNOSIS — E785 Hyperlipidemia, unspecified: Secondary | ICD-10-CM | POA: Diagnosis present

## 2015-11-01 DIAGNOSIS — K219 Gastro-esophageal reflux disease without esophagitis: Secondary | ICD-10-CM | POA: Diagnosis present

## 2015-11-01 LAB — CBC WITH DIFFERENTIAL/PLATELET
Basophils Absolute: 0.1 10*3/uL (ref 0.0–0.1)
Basophils Relative: 1 %
Eosinophils Absolute: 0.1 10*3/uL (ref 0.0–0.7)
Eosinophils Relative: 1 %
HCT: 46.2 % — ABNORMAL HIGH (ref 36.0–46.0)
Hemoglobin: 14.6 g/dL (ref 12.0–15.0)
Lymphocytes Relative: 34 %
Lymphs Abs: 3.9 10*3/uL (ref 0.7–4.0)
MCH: 27.8 pg (ref 26.0–34.0)
MCHC: 31.6 g/dL (ref 30.0–36.0)
MCV: 87.8 fL (ref 78.0–100.0)
Monocytes Absolute: 0.8 10*3/uL (ref 0.1–1.0)
Monocytes Relative: 7 %
Neutro Abs: 6.5 10*3/uL (ref 1.7–7.7)
Neutrophils Relative %: 57 %
Platelets: 311 10*3/uL (ref 150–400)
RBC: 5.26 MIL/uL — ABNORMAL HIGH (ref 3.87–5.11)
RDW: 13.6 % (ref 11.5–15.5)
WBC: 11.4 10*3/uL — ABNORMAL HIGH (ref 4.0–10.5)

## 2015-11-01 LAB — TROPONIN I
Troponin I: 0.03 ng/mL (ref <0.031)
Troponin I: 0.42 ng/mL — ABNORMAL HIGH (ref <0.031)

## 2015-11-01 LAB — BASIC METABOLIC PANEL
Anion gap: 8 (ref 5–15)
BUN: 19 mg/dL (ref 6–20)
CO2: 22 mmol/L (ref 22–32)
Calcium: 8.4 mg/dL — ABNORMAL LOW (ref 8.9–10.3)
Chloride: 113 mmol/L — ABNORMAL HIGH (ref 101–111)
Creatinine, Ser: 1.17 mg/dL — ABNORMAL HIGH (ref 0.44–1.00)
GFR calc Af Amer: 48 mL/min — ABNORMAL LOW (ref >60)
GFR calc non Af Amer: 42 mL/min — ABNORMAL LOW (ref >60)
Glucose, Bld: 134 mg/dL — ABNORMAL HIGH (ref 65–99)
Potassium: 4 mmol/L (ref 3.5–5.1)
Sodium: 143 mmol/L (ref 135–145)

## 2015-11-01 LAB — BRAIN NATRIURETIC PEPTIDE: B Natriuretic Peptide: 143.4 pg/mL — ABNORMAL HIGH (ref 0.0–100.0)

## 2015-11-01 LAB — CBG MONITORING, ED: Glucose-Capillary: 108 mg/dL — ABNORMAL HIGH (ref 65–99)

## 2015-11-01 MED ORDER — ADENOSINE 6 MG/2ML IV SOLN
INTRAVENOUS | Status: AC
  Filled 2015-11-01: qty 6

## 2015-11-01 MED ORDER — HEPARIN (PORCINE) IN NACL 100-0.45 UNIT/ML-% IJ SOLN
1250.0000 [IU]/h | INTRAMUSCULAR | Status: DC
  Administered 2015-11-01: 900 [IU]/h via INTRAVENOUS
  Administered 2015-11-03: 1150 [IU]/h via INTRAVENOUS
  Filled 2015-11-01 (×3): qty 250

## 2015-11-01 MED ORDER — ADENOSINE 6 MG/2ML IV SOLN
6.0000 mg | Freq: Once | INTRAVENOUS | Status: AC
  Administered 2015-11-01: 6 mg via INTRAVENOUS

## 2015-11-01 MED ORDER — ADENOSINE 6 MG/2ML IV SOLN
12.0000 mg | Freq: Once | INTRAVENOUS | Status: AC
  Administered 2015-11-01: 12 mg via INTRAVENOUS

## 2015-11-01 MED ORDER — ASPIRIN 81 MG PO CHEW
324.0000 mg | CHEWABLE_TABLET | Freq: Once | ORAL | Status: AC
  Administered 2015-11-01: 324 mg via ORAL
  Filled 2015-11-01: qty 4

## 2015-11-01 MED ORDER — HEPARIN BOLUS VIA INFUSION
4000.0000 [IU] | Freq: Once | INTRAVENOUS | Status: AC
Start: 2015-11-01 — End: 2015-11-01
  Administered 2015-11-01: 4000 [IU] via INTRAVENOUS

## 2015-11-01 MED ORDER — SODIUM CHLORIDE 0.9 % IV BOLUS (SEPSIS)
1000.0000 mL | Freq: Once | INTRAVENOUS | Status: AC
  Administered 2015-11-01: 1000 mL via INTRAVENOUS

## 2015-11-01 MED ORDER — ADENOSINE 6 MG/2ML IV SOLN
INTRAVENOUS | Status: AC
  Filled 2015-11-01: qty 4

## 2015-11-01 NOTE — ED Provider Notes (Signed)
CSN: [id number]     Arrival date & time 11/01/15  1457 History   First MD Initiated Contact with Patient 11/01/15 1504     Chief Complaint  Patient presents with  . Dizziness     (Consider location/radiation/quality/duration/timing/severity/associated sxs/prior Treatment) HPI The patient was having coffee when she had a sudden onset of racing heart. She had felt previously well. After onset she developed dizziness and nausea with shortness of breath. She has had episodes of racing heart that spontaneously resolved. She did wait a period of time but this has persisted. She denies any regular medications for heart rate. No history of CAD. There was no syncopal episode although she felt weak and as though she might pass out.  Patient denies she's had any recent fever, cough or sputum production. She denies recent dyspnea or chest pain. Past Medical History  Diagnosis Date  . Kidney stones   . GERD (gastroesophageal reflux disease)   . High cholesterol   . COPD (chronic obstructive pulmonary disease) (HCC)     slight   . Hypertension     patient states b/p up and down not on meds   Past Surgical History  Procedure Laterality Date  . Hernia repair    . Knee surgery    . Abdominal hysterectomy    . Cataract extraction    . Incisional hernia repair N/A 07/18/2014    Procedure: REPAIR OF INACERATED INCISIONAL HERNIA WITH MESH;  Surgeon: Armandina Gemma, MD;  Location: [facility];  Service: General;  Laterality: N/A;  . Insertion of mesh N/A 07/18/2014    Procedure: INSERTION OF MESH;  Surgeon: Armandina Gemma, MD;  Location: [facility];  Service: General;  Laterality: N/A;   No family history on file. Social History  Substance Use Topics  . Smoking status: Former Research scientist (life sciences)  . Smokeless tobacco: Never Used  . Alcohol Use: No   OB History    No data available     Review of Systems  10 Systems reviewed and are negative for acute change except as noted in the HPI.   Allergies  Nabumetone  Home  Medications   Prior to Admission medications   Not on File   BP 114/69 mmHg  Pulse 106  Temp(Src) 97.9 F (36.6 C) (Oral)  Resp 26  Ht '5\' 6"'$  (1.676 m)  Wt 175 lb (79.379 kg)  BMI 28.26 kg/m2  SpO2 96% Physical Exam  Constitutional: She is oriented to person, place, and time. She appears well-developed and well-nourished.  Patient has mildly pale appearance. She is alert and nontoxic. She is speaking in full sentences. Mild tachypnea.  HENT:  Head: Normocephalic and atraumatic.  Mouth/Throat: Oropharynx is clear and moist.  Eyes: EOM are normal. Pupils are equal, round, and reactive to light.  Neck: Neck supple.  Cardiovascular: Regular rhythm, normal heart sounds and intact distal pulses.   Tachycardia.  Pulmonary/Chest: Effort normal and breath sounds normal.  Abdominal: Soft. Bowel sounds are normal. She exhibits no distension. There is no tenderness.  Musculoskeletal: Normal range of motion. She exhibits no edema or tenderness.  Neurological: She is alert and oriented to person, place, and time. She has normal strength. She exhibits normal muscle tone. Coordination normal. GCS eye subscore is 4. GCS verbal subscore is 5. GCS motor subscore is 6.  Skin: Skin is warm, dry and intact.  Psychiatric: She has a normal mood and affect.    ED Course  Procedures (including critical care time)  Patient was placed on monitors  and oxygen with pacer defibrillator. Adenosine 6 mg was administered without response. This is repeated with adenosine 12 mg. Patient then responded and converted to sinus rhythm. Airway remained stable throughout. No other adjunct of supports were needed.  CRITICAL CARE Performed by: Charlesetta Shanks   Total critical care time: 45 minutes  Critical care time was exclusive of separately billable procedures and treating other patients.  Critical care was necessary to treat or prevent imminent or life-threatening deterioration.  Critical care was time spent  personally by me on the following activities: development of treatment plan with patient and/or surrogate as well as nursing, discussions with consultants, evaluation of patient's response to treatment, examination of patient, obtaining history from patient or surrogate, ordering and performing treatments and interventions, ordering and review of laboratory studies, ordering and review of radiographic studies, pulse oximetry and re-evaluation of patient's condition. Labs Review Labs Reviewed  CBG MONITORING, ED - Abnormal; Notable for the following:    Glucose-Capillary 108 (*)    All other components within normal limits  BASIC METABOLIC PANEL  TROPONIN I  BRAIN NATRIURETIC PEPTIDE  CBC WITH DIFFERENTIAL/PLATELET    Imaging Review No results found. I have personally reviewed and evaluated these images and lab results as part of my medical decision-making.   EKG Interpretation   Date/Time:  Thursday November 01 2015 15:27:37 EST Ventricular Rate:  87 PR Interval:  157 QRS Duration: 88 QT Interval:  338 QTC Calculation: 407 R Axis:   3 Text Interpretation:  Sinus arrhythmia Nonspecific repol abnormality,  diffuse leads Baseline wander in lead(s) III aVF increased ST depression  c/w old Confirmed by Johnney Killian, MD, Jeannie Done [phone]) on 11/01/2015 3:34:21 PM     Recheck 1710 patient reports she feels improved. She reports she continues to have some nausea. She denies chest pain or dyspnea. She denies prior history of fever, cough or dyspnea before the onset of her SVT event.  Recheck 2000 no chest pain or shortness of breath. No nausea.  Consultation: Patient's case is reviewed Dr. Georgina Peer of cardiology for admission. He does advise for initiating heparin drip. MDM   Final diagnoses:  None   Patient present with SVT of acute onset. Patient was converted to sinus rhythm with adenosine. After conversion she did have some residual feeling of nausea but not chest pain. Serial cardiac enzyme  showed elevation to 0.4. At this point there is suspicion for demand ischemia versus possible silent MI. Patient will be admitted for continued rule out of MI. Patient's case was reviewed with Dr. Georgina Peer. Patient is initiated on heparin. Patient denies any chest pain or shortness of breath or residual nausea. Her general appearance is good. She is alert with good color and no signs of respiratory distress. She will be admitted for further evaluation.    Charlesetta Shanks, MD 11/01/15 [phone]

## 2015-11-01 NOTE — ED Notes (Signed)
MD at bedside. 

## 2015-11-01 NOTE — ED Notes (Signed)
Portable xray at bedside.

## 2015-11-01 NOTE — Progress Notes (Signed)
ANTICOAGULATION CONSULT NOTE - Initial Consult  Pharmacy Consult for Heparin Indication: chest pain/ACS  Allergies  Allergen Reactions  . Nabumetone Itching and Swelling    Patient Measurements: Height: '5\' 6"'$  (167.6 cm) Weight: 175 lb (79.379 kg) IBW/kg (Calculated) : 59.3 Heparin Dosing Weight: 75.5 kg  Vital Signs: Temp: 97.9 F (36.6 C) (01/05 1509) Temp Source: Oral (01/05 1509) BP: 121/63 mmHg (01/05 2036) Pulse Rate: 62 (01/05 2036)  Labs:  Recent Labs  11/01/15 1510 11/01/15 1755  HGB 14.6  --   HCT 46.2*  --   PLT 311  --   CREATININE 1.17*  --   TROPONINI 0.03 0.42*    Estimated Creatinine Clearance: 38 mL/min (by C-G formula based on Cr of 1.17).   Medical History: Past Medical History  Diagnosis Date  . Kidney stones   . GERD (gastroesophageal reflux disease)   . High cholesterol   . COPD (chronic obstructive pulmonary disease) (HCC)     slight   . Hypertension     patient states b/p up and down not on meds    Medications:   (Not in a hospital admission) Scheduled:  . adenosine      . adenosine       Infusions:    Assessment: 82yo female with history of GERD, COPD, HLD and HTN presents to [facility] with dizziness, nausea and SOB. Pharmacy is consulted to dose heparin for ACS/chest pain. Hgb 14.6, Plt 311, sCr 1.17, Trop 0.42.  Goal of Therapy:  Heparin level 0.3-0.7 units/ml Monitor platelets by anticoagulation protocol: Yes   Plan:  Give 4000 units bolus x 1 Start heparin infusion at 900 units/hr Check anti-Xa level in 8 hours and daily while on heparin Continue to monitor H&H and platelets  Andrey Cota. Diona Foley, PharmD, [place] Clinical Pharmacist Pager [phone] 11/01/2015,8:49 PM

## 2015-11-01 NOTE — ED Notes (Signed)
Brooke Mora denies ever having chest pain now or in past and states she has never seen a cardiologist for any reason

## 2015-11-01 NOTE — H&P (Signed)
Brooke Mora is an 82 y.o. female.    Chief Complaint: palpitations, SVT Primary Cardiologist: new HPI: [name] is an 82 yo woman, primarily [place] speaking but has lived in [place] for 25 years originally from [place] with [place] og GERD, COPD, hypertension (not on medications) and kidney stones who presents with palpitations that began after coughing spell. She also noted some dizziness, nausea and shortness of breath. She tells me she's been well with no complaints until morning at breakfast when she noted the palpitations with some dizziness. It continued and she ultimately got a family member to bring her to the ER. She doesn't endorse this but she received 2 doses of adenosine and it ultimately broke her rhythm/SVT. She feels fine now. She denies chest pain at home or exercise intolerance. She's a previous smoker but quit many years ago. She says if anything she's gained weight not lost weight. No changes in appetite and her clothes fit normally. No sick contacts or infectious symptoms of fever/chills/nausea/vomiting. She was started on heparin gtt given her prompt rise in troponin. She denies bleeding problems or upcoming surgery. No dark stools or BRBPR.   Review of her chest x-ray revealed cavitary right upper lung lesion so I placed her on airborne isolation before she arrived to [employer] from [facility].     Past Medical History  Diagnosis Date  . Kidney stones   . GERD (gastroesophageal reflux disease)   . High cholesterol   . COPD (chronic obstructive pulmonary disease) (HCC)     slight   . Hypertension     patient states b/p up and down not on meds    Past Surgical History  Procedure Laterality Date  . Hernia repair    . Knee surgery    . Abdominal hysterectomy    . Cataract extraction    . Incisional hernia repair N/A 07/18/2014    Procedure: REPAIR OF INACERATED INCISIONAL HERNIA WITH MESH;  Surgeon: Armandina Gemma, MD;  Location: [facility];  Service:  General;  Laterality: N/A;  . Insertion of mesh N/A 07/18/2014    Procedure: INSERTION OF MESH;  Surgeon: Armandina Gemma, MD;  Location: [facility];  Service: General;  Laterality: N/A;    No family history on file. Social History:  reports that she has quit smoking. She has never used smokeless tobacco. She reports that she does not drink alcohol or use illicit drugs.  Allergies:  Allergies  Allergen Reactions  . Nabumetone Itching and Swelling     (Not in a hospital admission)  Results for orders placed or performed during the hospital encounter of 11/01/15 (from the past 48 hour(s))  Basic metabolic panel     Status: Abnormal   Collection Time: 11/01/15  3:10 PM  Result Value Ref Range   Sodium 143 135 - 145 mmol/L   Potassium 4.0 3.5 - 5.1 mmol/L   Chloride 113 (H) 101 - 111 mmol/L   CO2 22 22 - 32 mmol/L   Glucose, Bld 134 (H) 65 - 99 mg/dL   BUN 19 6 - 20 mg/dL   Creatinine, Ser 1.17 (H) 0.44 - 1.00 mg/dL   Calcium 8.4 (L) 8.9 - 10.3 mg/dL   GFR calc non Af Amer 42 (L) >60 mL/min   GFR calc Af Amer 48 (L) >60 mL/min    Comment: (NOTE) The eGFR has been calculated using the CKD EPI equation. This calculation has not been validated in all clinical situations. eGFR's persistently <60  mL/min signify possible Chronic Kidney Disease.    Anion gap 8 5 - 15  Troponin I     Status: None   Collection Time: 11/01/15  3:10 PM  Result Value Ref Range   Troponin I 0.03 <0.031 ng/mL    Comment:        NO INDICATION OF MYOCARDIAL INJURY.   Brain natriuretic peptide     Status: Abnormal   Collection Time: 11/01/15  3:10 PM  Result Value Ref Range   B Natriuretic Peptide 143.4 (H) 0.0 - 100.0 pg/mL  CBC with Differential     Status: Abnormal   Collection Time: 11/01/15  3:10 PM  Result Value Ref Range   WBC 11.4 (H) 4.0 - 10.5 K/uL   RBC 5.26 (H) 3.87 - 5.11 MIL/uL   Hemoglobin 14.6 12.0 - 15.0 g/dL   HCT 46.2 (H) 36.0 - 46.0 %   MCV 87.8 78.0 - 100.0 fL   MCH 27.8 26.0 - 34.0  pg   MCHC 31.6 30.0 - 36.0 g/dL   RDW 13.6 11.5 - 15.5 %   Platelets 311 150 - 400 K/uL   Neutrophils Relative % 57 %   Neutro Abs 6.5 1.7 - 7.7 K/uL   Lymphocytes Relative 34 %   Lymphs Abs 3.9 0.7 - 4.0 K/uL   Monocytes Relative 7 %   Monocytes Absolute 0.8 0.1 - 1.0 K/uL   Eosinophils Relative 1 %   Eosinophils Absolute 0.1 0.0 - 0.7 K/uL   Basophils Relative 1 %   Basophils Absolute 0.1 0.0 - 0.1 K/uL  CBG monitoring, ED     Status: Abnormal   Collection Time: 11/01/15  3:23 PM  Result Value Ref Range   Glucose-Capillary 108 (H) 65 - 99 mg/dL  Troponin I     Status: Abnormal   Collection Time: 11/01/15  5:55 PM  Result Value Ref Range   Troponin I 0.42 (H) <0.031 ng/mL    Comment:        PERSISTENTLY INCREASED TROPONIN VALUES IN THE RANGE OF 0.04-0.49 ng/mL CAN BE SEEN IN:       -UNSTABLE ANGINA       -CONGESTIVE HEART FAILURE       -MYOCARDITIS       -CHEST TRAUMA       -ARRYHTHMIAS       -LATE PRESENTING MYOCARDIAL INFARCTION       -COPD   CLINICAL FOLLOW-UP RECOMMENDED.    Dg Chest Port 1 View  11/01/2015  CLINICAL DATA:  82 year old presenting with tachycardia, shortness of breath, fever and chills, acute onset. EXAM: PORTABLE CHEST 1 VIEW COMPARISON:  04/06/2014, 05/07/2013. FINDINGS: Cardiac silhouette upper normal in size, unchanged. Thoracic aorta tortuous and mildly atherosclerotic, unchanged. Thick walled cavity in the right lung apex without air-fluid level. Patchy airspace opacities in the left upper lobe. Lungs otherwise clear. No visible pleural effusions. IMPRESSION: 1. Thick walled cavity involving the right lung apex without an associated air-fluid level. Differential diagnosis includes malignancy and lung abscess. 2. Patchy pneumonia involving the left upper lobe. Electronically Signed   By: Evangeline Dakin M.D.   On: 11/01/2015 15:49    Review of Systems  Constitutional: Negative for fever, chills, weight loss and malaise/fatigue.  HENT: Negative for  ear pain and tinnitus.   Eyes: Negative for double vision and photophobia.  Respiratory: Positive for shortness of breath. Negative for cough, hemoptysis and sputum production.   Cardiovascular: Positive for palpitations. Negative for orthopnea, claudication and leg swelling.  Gastrointestinal: Negative for nausea, vomiting and abdominal pain.  Genitourinary: Positive for dysuria. Negative for urgency and hematuria.  Musculoskeletal: Negative for myalgias, back pain and neck pain.  Neurological: Positive for dizziness and headaches. Negative for tingling and tremors.  Endo/Heme/Allergies: Negative for polydipsia. Does not bruise/bleed easily.  Psychiatric/Behavioral: Negative for depression, suicidal ideas, hallucinations and substance abuse.    Blood pressure 121/63, pulse 62, temperature 97.9 F (36.6 C), temperature source Oral, resp. rate 22, height 5' 6"  (1.676 m), weight 79.379 kg (175 lb), SpO2 96 %. Physical Exam  Nursing note and vitals reviewed. Constitutional: She is oriented to person, place, and time. She appears well-developed and well-nourished. No distress.  HENT:  Nose: Nose normal.  Mouth/Throat: Oropharynx is clear and moist. No oropharyngeal exudate.  Eyes: Conjunctivae and EOM are normal. Pupils are equal, round, and reactive to light. No scleral icterus.  Neck: Normal range of motion. Neck supple. No JVD present. No tracheal deviation present.  Cardiovascular: Normal rate, regular rhythm, normal heart sounds and intact distal pulses.  Exam reveals no gallop.   No murmur heard. Respiratory: Breath sounds normal. No respiratory distress. She has no wheezes. She has no rales.  GI: Soft. Bowel sounds are normal. She exhibits no distension. There is no tenderness. There is no rebound.  Musculoskeletal: Normal range of motion. She exhibits no edema or tenderness.  Neurological: She is alert and oriented to person, place, and time. No cranial nerve deficit. Coordination  normal.  Skin: Skin is warm and dry. No rash noted. She is not diaphoretic. No erythema.  Psychiatric: She has a normal mood and affect. Her behavior is normal. Judgment and thought content normal.    Labs reviewed; bun/cr 19/1.2, glucose 131, K 4.0, na 143, h/h 14.6/46.2, plt 311, wbc 10.4 EKG: SVT 181, Sinus arrhythmia with diffuse ST depression and sinus rhythm with diffuse more subtle ST depression Chest x-ray: thick walled cavitary lung lesion of right apex and patchy pneumonia involving the left upper lobe Trop 0.03 --> 0.42 Assessment/Plan Brooke Mora is an 82 yo woman with PMH og GERD, COPD, hypertension (not on medications) and kidney stones who presents with palpitations and found to have SVT, ST depression on ECG, elevated troponin and RUL cavitary lesion and LUL patchy opacity. Differential diagnosis for elevated troponin includes demand ischemia from other stress event (sepsis/PNA/abscess), neurologic disease, acute renal failure, among other etiologies. I favor a diagnosis of demand ischemia in setting of ? Infection with likely underlying CAD. However, she had an abrupt rise in troponin. She was started on heparin gtt at [facility] which is reasonable. Will continue heparin gtt for now, assess chest x-ray cavitary lesion findings and draw cultures. Differential diagnosis for cavitary lung lesion includes infectious causes - bacterial, fungal, parasitic agents, Tuberculous, malignancy, rheumatologic conditions among others. I discussed the findings with Brooke Mora in [place].    Problem List SVT NSTEMI - type I vs. Type II Cavitary Lung Lesion Patchy Lung Opacity  1. trend cardiac markers, admission to telemetry 2. blood, urine, sputum culture with 3 sputum for AFB, place PPD, quantiferon gold; airborne isolation 3. continue daily asa 81 mg, heparin gtt 4. Metoprolol 12.5 mg bid; atorvastatin 80 mg qHS 5. BNP, TSH, hba1c, lipid panel, echocardiogram 6. Consult  pulmonary today to review chest radiograph and guide further management of cavitary lesion 7. Favor LHC at some point given rise of troponin but will defer to echocardiogram findings and further evaluation of her cavitary lesion  Akira Perusse 11/01/2015, 9:43 PM

## 2015-11-01 NOTE — ED Notes (Signed)
Attempted call report to floor RN unable to take at this time.

## 2015-11-01 NOTE — ED Notes (Signed)
C/o dizzness, nausea, SOB-s/s 11am-granddaughter interpreting for pt-pt is pale and appears SOB

## 2015-11-01 NOTE — ED Notes (Signed)
Crash cart at bedside pt with cardiac pads on

## 2015-11-01 NOTE — ED Notes (Signed)
Pt with SOB dusky in color , diaphoretic, pt denies CP, c/o palpations x 6 hrs. Family at bedside

## 2015-11-02 ENCOUNTER — Inpatient Hospital Stay (HOSPITAL_COMMUNITY): Payer: Medicaid Other

## 2015-11-02 DIAGNOSIS — I471 Supraventricular tachycardia: Principal | ICD-10-CM

## 2015-11-02 DIAGNOSIS — J984 Other disorders of lung: Secondary | ICD-10-CM

## 2015-11-02 DIAGNOSIS — R079 Chest pain, unspecified: Secondary | ICD-10-CM

## 2015-11-02 DIAGNOSIS — R7989 Other specified abnormal findings of blood chemistry: Secondary | ICD-10-CM

## 2015-11-02 LAB — EXPECTORATED SPUTUM ASSESSMENT W REFEX TO RESP CULTURE

## 2015-11-02 LAB — CBC WITH DIFFERENTIAL/PLATELET
Basophils Absolute: 0 10*3/uL (ref 0.0–0.1)
Basophils Relative: 0 %
Eosinophils Absolute: 0.2 10*3/uL (ref 0.0–0.7)
Eosinophils Relative: 2 %
HCT: 42.3 % (ref 36.0–46.0)
Hemoglobin: 13.6 g/dL (ref 12.0–15.0)
Lymphocytes Relative: 50 %
Lymphs Abs: 4.2 10*3/uL — ABNORMAL HIGH (ref 0.7–4.0)
MCH: 28 pg (ref 26.0–34.0)
MCHC: 32.2 g/dL (ref 30.0–36.0)
MCV: 87 fL (ref 78.0–100.0)
Monocytes Absolute: 0.6 10*3/uL (ref 0.1–1.0)
Monocytes Relative: 8 %
Neutro Abs: 3.4 10*3/uL (ref 1.7–7.7)
Neutrophils Relative %: 40 %
Platelets: 232 10*3/uL (ref 150–400)
RBC: 4.86 MIL/uL (ref 3.87–5.11)
RDW: 13.5 % (ref 11.5–15.5)
WBC: 8.4 10*3/uL (ref 4.0–10.5)

## 2015-11-02 LAB — BASIC METABOLIC PANEL
Anion gap: 8 (ref 5–15)
BUN: 13 mg/dL (ref 6–20)
CO2: 24 mmol/L (ref 22–32)
Calcium: 8.9 mg/dL (ref 8.9–10.3)
Chloride: 110 mmol/L (ref 101–111)
Creatinine, Ser: 0.78 mg/dL (ref 0.44–1.00)
GFR calc Af Amer: >60 mL/min (ref >60)
GFR calc non Af Amer: >60 mL/min (ref >60)
Glucose, Bld: 110 mg/dL — ABNORMAL HIGH (ref 65–99)
Potassium: 4 mmol/L (ref 3.5–5.1)
Sodium: 142 mmol/L (ref 135–145)

## 2015-11-02 LAB — COMPREHENSIVE METABOLIC PANEL
ALT: 15 U/L (ref 14–54)
AST: 28 U/L (ref 15–41)
Albumin: 3.7 g/dL (ref 3.5–5.0)
Alkaline Phosphatase: 59 U/L (ref 38–126)
Anion gap: 9 (ref 5–15)
BUN: 13 mg/dL (ref 6–20)
CO2: 23 mmol/L (ref 22–32)
Calcium: 8.7 mg/dL — ABNORMAL LOW (ref 8.9–10.3)
Chloride: 110 mmol/L (ref 101–111)
Creatinine, Ser: 0.88 mg/dL (ref 0.44–1.00)
GFR calc Af Amer: >60 mL/min (ref >60)
GFR calc non Af Amer: 59 mL/min — ABNORMAL LOW (ref >60)
Glucose, Bld: 116 mg/dL — ABNORMAL HIGH (ref 65–99)
Potassium: 3.8 mmol/L (ref 3.5–5.1)
Sodium: 142 mmol/L (ref 135–145)
Total Bilirubin: 1 mg/dL (ref 0.3–1.2)
Total Protein: 6.2 g/dL — ABNORMAL LOW (ref 6.5–8.1)

## 2015-11-02 LAB — CBC
HCT: 43.6 % (ref 36.0–46.0)
Hemoglobin: 13.8 g/dL (ref 12.0–15.0)
MCH: 27.7 pg (ref 26.0–34.0)
MCHC: 31.7 g/dL (ref 30.0–36.0)
MCV: 87.4 fL (ref 78.0–100.0)
Platelets: 219 10*3/uL (ref 150–400)
RBC: 4.99 MIL/uL (ref 3.87–5.11)
RDW: 13.6 % (ref 11.5–15.5)
WBC: 7 10*3/uL (ref 4.0–10.5)

## 2015-11-02 LAB — PROTIME-INR
INR: 1.16 (ref 0.00–1.49)
Prothrombin Time: 15 seconds (ref 11.6–15.2)

## 2015-11-02 LAB — LIPID PANEL
Cholesterol: 191 mg/dL (ref 0–200)
HDL: 30 mg/dL — ABNORMAL LOW (ref >40)
LDL Cholesterol: 126 mg/dL — ABNORMAL HIGH (ref 0–99)
Total CHOL/HDL Ratio: 6.4 RATIO
Triglycerides: 176 mg/dL — ABNORMAL HIGH (ref <150)
VLDL: 35 mg/dL (ref 0–40)

## 2015-11-02 LAB — APTT: aPTT: 49 seconds — ABNORMAL HIGH (ref 24–37)

## 2015-11-02 LAB — HEPARIN LEVEL (UNFRACTIONATED)
Heparin Unfractionated: 0.2 IU/mL — ABNORMAL LOW (ref 0.30–0.70)
Heparin Unfractionated: 0.36 IU/mL (ref 0.30–0.70)

## 2015-11-02 LAB — TROPONIN I
Troponin I: 1.23 ng/mL (ref <0.031)
Troponin I: 0.92 ng/mL (ref <0.031)
Troponin I: 0.74 ng/mL (ref <0.031)

## 2015-11-02 LAB — MAGNESIUM: Magnesium: 2 mg/dL (ref 1.7–2.4)

## 2015-11-02 LAB — TSH: TSH: 1.703 u[IU]/mL (ref 0.350–4.500)

## 2015-11-02 LAB — T4, FREE: Free T4: 0.9 ng/dL (ref 0.61–1.12)

## 2015-11-02 LAB — BRAIN NATRIURETIC PEPTIDE: B Natriuretic Peptide: 272 pg/mL — ABNORMAL HIGH (ref 0.0–100.0)

## 2015-11-02 LAB — EXPECTORATED SPUTUM ASSESSMENT W GRAM STAIN, RFLX TO RESP C

## 2015-11-02 MED ORDER — NITROGLYCERIN 0.4 MG SL SUBL: 0.4000 mg | SUBLINGUAL_TABLET | SUBLINGUAL | Status: DC | PRN

## 2015-11-02 MED ORDER — METOPROLOL TARTRATE 12.5 MG HALF TABLET
12.5000 mg | ORAL_TABLET | Freq: Two times a day (BID) | ORAL | Status: DC
  Administered 2015-11-02 – 2015-11-07 (×12): 12.5 mg via ORAL
  Filled 2015-11-02 (×13): qty 1

## 2015-11-02 MED ORDER — SODIUM CHLORIDE 0.9 % IJ SOLN
3.0000 mL | Freq: Two times a day (BID) | INTRAMUSCULAR | Status: DC
  Administered 2015-11-02 – 2015-11-07 (×7): 3 mL via INTRAVENOUS

## 2015-11-02 MED ORDER — SODIUM CHLORIDE 0.9 % IJ SOLN: 3.0000 mL | INTRAMUSCULAR | Status: DC | PRN

## 2015-11-02 MED ORDER — TUBERCULIN PPD 5 UNIT/0.1ML ID SOLN
5.0000 [IU] | Freq: Once | INTRADERMAL | Status: DC
  Filled 2015-11-02: qty 0.1

## 2015-11-02 MED ORDER — ATORVASTATIN CALCIUM 80 MG PO TABS
80.0000 mg | ORAL_TABLET | Freq: Every day | ORAL | Status: DC
  Administered 2015-11-02 – 2015-11-06 (×6): 80 mg via ORAL
  Filled 2015-11-02 (×6): qty 1

## 2015-11-02 MED ORDER — TUBERCULIN PPD 5 UNIT/0.1ML ID SOLN
5.0000 [IU] | Freq: Once | INTRADERMAL | Status: AC
  Administered 2015-11-02: 5 [IU] via INTRADERMAL
  Filled 2015-11-02 (×2): qty 0.1

## 2015-11-02 MED ORDER — ASPIRIN EC 81 MG PO TBEC
81.0000 mg | DELAYED_RELEASE_TABLET | Freq: Every day | ORAL | Status: DC
  Administered 2015-11-02 – 2015-11-07 (×5): 81 mg via ORAL
  Filled 2015-11-02 (×5): qty 1

## 2015-11-02 MED ORDER — SODIUM CHLORIDE 0.9 % IV SOLN: 250.0000 mL | INTRAVENOUS | Status: DC | PRN

## 2015-11-02 MED ORDER — ONDANSETRON HCL 4 MG/2ML IJ SOLN
4.0000 mg | Freq: Four times a day (QID) | INTRAMUSCULAR | Status: DC | PRN
  Administered 2015-11-06: 4 mg via INTRAVENOUS
  Filled 2015-11-02: qty 2

## 2015-11-02 MED ORDER — ASPIRIN 81 MG PO CHEW: 324.0000 mg | CHEWABLE_TABLET | ORAL | Status: DC

## 2015-11-02 MED ORDER — ASPIRIN 300 MG RE SUPP: 300.0000 mg | RECTAL | Status: DC

## 2015-11-02 MED ORDER — ACETAMINOPHEN 325 MG PO TABS
650.0000 mg | ORAL_TABLET | ORAL | Status: DC | PRN
  Administered 2015-11-06: 650 mg via ORAL
  Filled 2015-11-02: qty 2

## 2015-11-02 NOTE — Progress Notes (Signed)
Notified by Ria Comment in lab that sputum sent for AFB and culture was not a good specimen and need to be recollected.  Karie Kirks, Therapist, sports.

## 2015-11-02 NOTE — Progress Notes (Signed)
ANTICOAGULATION CONSULT NOTE - Follow Up Consult  Pharmacy Consult for Heparin  Indication: chest pain/ACS  Allergies  Allergen Reactions  . Nabumetone Itching and Swelling    Patient Measurements: Height: '5\' 3"'$  (160 cm) Weight: 181 lb 7 oz (82.3 kg) IBW/kg (Calculated) : 52.4  Vital Signs: Temp: 98.1 F (36.7 C) (01/06 0224) Temp Source: Oral (01/06 0224) BP: 154/68 mmHg (01/06 0224) Pulse Rate: 60 (01/06 0224)  Labs:  Recent Labs  11/01/15 1510 11/01/15 1755 11/02/15 0425  HGB 14.6  --  13.6  HCT 46.2*  --  42.3  PLT 311  --  232  APTT  --   --  49*  LABPROT  --   --  15.0  INR  --   --  1.16  HEPARINUNFRC  --   --  0.20*  CREATININE 1.17*  --   --   TROPONINI 0.03 0.42*  --     Estimated Creatinine Clearance: 36.4 mL/min (by C-G formula based on Cr of 1.17).  Assessment: Heparin for elevated trop, initial heparin level is low at 0.2, other labs reviewed, no issues per RN.   Goal of Therapy:  Heparin level 0.3-0.7 units/ml Monitor platelets by anticoagulation protocol: Yes   Plan:  -Increase heparin to 1050 units/hr -1300 HL  Brooke Mora 11/02/2015,5:16 AM

## 2015-11-02 NOTE — Consult Note (Signed)
Name: Brooke Mora MRN: [id number] DOB: 06-Nov-1928    ADMISSION DATE:  11/01/2015 CONSULTATION DATE:  11/02/15  REFERRING MD :  Dr. Mare Ferrari / Cardiology   CHIEF COMPLAINT:  Abnormal CXR   HISTORY OF PRESENT ILLNESS:  82 y/o, former smoker, spanish speaking F (originally from [place]) with PMH of GERD, hernia repair, kidney stones, HLD, HTN, and COPD admitted on 1/5 with palpitations, SVT and SOB.   The patient presented to [place] on 1/5 with reports of nausea, dizziness, and shortness of breath for 6 hours.  She reports she was drinking coffee at onset of symptoms.  She noted palpitation / sensation of heart racing.  She denied syncope / pre-syncope.  Prior to episode she was in her usual state of health.    She is a former smoker - 59 years x1/2 PPD.  She quit smoking in 2007.  She denies baseline SOB, cough, sputum production.  Her former husband had tuberculosis approximately 30 years ago and was treated.  She denies ever being treated or symptoms.  Currently denies night sweats, weight loss, fevers/chills, hemoptysis, n/v/d, and lower extremity swelling.  The patient was admitted was admitted per Cardiology for SVT.  She was treated in the ER with adenosine x2 with break of SVT and return to NSR. Initial labs - WBC 11.4, platelets 311, NA 142, K 4, sr cr 0.78, and Troponin  0.42.  Given elevated troponin, the patient was started on a heparin gtt per Cardiology.  Initial CXR was notable for a thick walled RUL cavitary lesion & patchy L lung opacity.    PCCM consulted for evaluation of RUL cavitary lesion / abnormal CXR.   PAST MEDICAL HISTORY :   has a past medical history of Kidney stones; GERD (gastroesophageal reflux disease); High cholesterol; COPD (chronic obstructive pulmonary disease) ([place]); and Hypertension.  has past surgical history that includes Hernia repair; Knee surgery; Abdominal hysterectomy; Cataract extraction; Incisional hernia repair (N/A, 07/18/2014); and  Insertion of mesh (N/A, 07/18/2014).   Prior to Admission medications   Medication Sig Start Date End Date Taking? Authorizing Provider  ibuprofen (ADVIL,MOTRIN) 400 MG tablet Take 400 mg by mouth every 6 (six) hours as needed for mild pain.   Yes Historical Provider, MD   Allergies  Allergen Reactions  . Nabumetone Itching and Swelling    FAMILY HISTORY:  family history is not on file.   SOCIAL HISTORY:  reports that she has quit smoking. She has never used smokeless tobacco. She reports that she does not drink alcohol or use illicit drugs.  REVIEW OF SYSTEMS:   Constitutional: Negative for fever, chills, weight loss, malaise/fatigue and diaphoresis.  HENT: Negative for hearing loss, ear pain, nosebleeds, congestion, sore throat, neck pain, tinnitus and ear discharge.   Eyes: Negative for blurred vision, double vision, photophobia, pain, discharge and redness.  Respiratory: Negative for cough, hemoptysis, sputum production, wheezing and stridor.  SOB on admit, now resolved Cardiovascular: Negative for chest pain, orthopnea, claudication, leg swelling and PND. Palpitations on admit, now resolved Gastrointestinal: Negative for heartburn, nausea, vomiting, abdominal pain, diarrhea, constipation, blood in stool and melena.  Genitourinary: Negative for dysuria, urgency, frequency, hematuria and flank pain.  Musculoskeletal: Negative for myalgias, back pain, joint pain and falls.  Skin: Negative for itching and rash.  Neurological: Negative for dizziness, tingling, tremors, sensory change, speech change, focal weakness, seizures, loss of consciousness, weakness and headaches.  Endo/Heme/Allergies: Negative for environmental allergies and polydipsia. Does not bruise/bleed easily.  SUBJECTIVE:  VITAL SIGNS: Temp:  [97.9 F (36.6 C)-98.1 F (36.7 C)] 97.9 F (36.6 C) (01/06 1234) Pulse Rate:  [56-182] 88 (01/06 1234) Resp:  [15-28] 18 (01/06 1234) BP: (85-154)/(57-71) 147/67 mmHg  (01/06 1234) SpO2:  [9 %-100 %] 99 % (01/06 1234) Weight:  [175 lb (79.379 kg)-181 lb 7 oz (82.3 kg)] 181 lb 7 oz (82.3 kg) (01/06 0224)  PHYSICAL EXAMINATION: General:  Well developed, well nourished elderly female in NAD, appears younger than stated age Neuro:  AAOx4, speech clear, MAE, normal strength HEENT:  MM pink/moist, no jvd, no LAN Cardiovascular:  s1s2 rrr, no m/r/g Lungs:  resp's even/non-labored, lungs bilaterally clear, no wheeze  Abdomen:  Obese, soft, bsx4 active  Musculoskeletal:  No acute deformities  Skin:  Warm/dry, no edema    Recent Labs Lab 11/01/15 1510 11/02/15 0425 11/02/15 1048  NA 143 142 142  K 4.0 3.8 4.0  CL 113* 110 110  CO2 '22 23 24  '$ BUN '19 13 13  '$ CREATININE 1.17* 0.88 0.78  GLUCOSE 134* 116* 110*    Recent Labs Lab 11/01/15 1510 11/02/15 0425 11/02/15 1048  HGB 14.6 13.6 13.8  HCT 46.2* 42.3 43.6  WBC 11.4* 8.4 7.0  PLT 311 232 219   Dg Chest Port 1 View  11/01/2015  CLINICAL DATA:  82 year old presenting with tachycardia, shortness of breath, fever and chills, acute onset. EXAM: PORTABLE CHEST 1 VIEW COMPARISON:  04/06/2014, 05/07/2013. FINDINGS: Cardiac silhouette upper normal in size, unchanged. Thoracic aorta tortuous and mildly atherosclerotic, unchanged. Thick walled cavity in the right lung apex without air-fluid level. Patchy airspace opacities in the left upper lobe. Lungs otherwise clear. No visible pleural effusions. IMPRESSION: 1. Thick walled cavity involving the right lung apex without an associated air-fluid level. Differential diagnosis includes malignancy and lung abscess. 2. Patchy pneumonia involving the left upper lobe. Electronically Signed   By: Evangeline Dakin M.D.   On: 11/01/2015 15:49     SIGNIFICANT EVENTS  01/05  Admit to [facility] with palpitations, SVT s/p adenosine x2   STUDIES:  01/05  CXR >> thick walled cavity in RUL, LUL airspace disease 01/06  CT Chest >>   DISCUSSION:  82 y/o F with a PMH of former  tobacco abuse, undefined COPD and prior exposure to TB admitted on 1/5 with SVT, SOB, and palpitations.  Adenosine x2 with conversion to NSR.  Incidental finding of RUL cavitary lesion and L airspace disease on CXR.  This is new from last CXR in system from 2015.  Hx of close relative exposure with TB.  DDx includes bacterial / fungal, tuberculous, autoimmune and malignancy given smoking history.  ASSESSMENT / PLAN:  RUL Cavitary Lesion LUL Airspace Disease  Suspected COPD  Former Tobacco Abuse   Plan: Assess AFB x3 Assess PPD & Quantiferon Gold  Tentatively will add for FOB on Monday 1/9 for washings @ 10 AM due to difficulty bringing up sputum Intermittent CXR Airborne precautions Wean oxygen for sats > 92% Assess CT chest to better evaluate RUL / LUL   SVT  Elevated Troponin   Plan: Per Cardiology  Pending pulmonary evaluation, may have [place], NP-C [facility] Pgr: [phone] or if no answer [phone] 11/02/2015, 1:27 PM

## 2015-11-02 NOTE — Progress Notes (Signed)
SUBJECTIVE:  Feels better than yesterday. No further palpitations. No chest discomfort. She is having trouble getting a deep breath. History is obtained with the help of a translator.  OBJECTIVE:   Vitals:   Filed Vitals:   11/02/15 0113 11/02/15 0224 11/02/15 1111 11/02/15 1234  BP:  154/68 141/71 147/67  Pulse: 56 60 61 88  Temp:  98.1 F (36.7 C)  97.9 F (36.6 C)  TempSrc:  Oral  Oral  Resp: '16 20  18  '$ Height:  '5\' 3"'$  (1.6 m)    Weight:  181 lb 7 oz (82.3 kg)    SpO2: 97% 97%  99%   I&O's:   Intake/Output Summary (Last 24 hours) at 11/02/15 1344 Last data filed at 11/02/15 0839  Gross per 24 hour  Intake    120 ml  Output    600 ml  Net   -480 ml   TELEMETRY: Reviewed telemetry pt in normal sinus rhythm:     PHYSICAL EXAM General: Well developed, well nourished, in no acute distress Head:   Normal cephalic and atramatic  Lungs:   Basilar crackles bilaterally to auscultation. Heart:   HRRR S1 S2  No JVD.   Abdomen: abdomen soft and non-tender Msk:  Back normal,  Normal strength and tone for age. Extremities:  Trace pretibial edema.   Neuro: Alert and oriented. Psych:  Normal affect, responds appropriately Skin: No rash   LABS: Basic Metabolic Panel:  Recent Labs  11/02/15 0425 11/02/15 1048  NA 142 142  K 3.8 4.0  CL 110 110  CO2 23 24  GLUCOSE 116* 110*  BUN 13 13  CREATININE 0.88 0.78  CALCIUM 8.7* 8.9  MG 2.0  --    Liver Function Tests:  Recent Labs  11/02/15 0425  AST 28  ALT 15  ALKPHOS 59  BILITOT 1.0  PROT 6.2*  ALBUMIN 3.7   No results for input(s): LIPASE, AMYLASE in the last 72 hours. CBC:  Recent Labs  11/01/15 1510 11/02/15 0425 11/02/15 1048  WBC 11.4* 8.4 7.0  NEUTROABS 6.5 3.4  --   HGB 14.6 13.6 13.8  HCT 46.2* 42.3 43.6  MCV 87.8 87.0 87.4  PLT 311 232 219   Cardiac Enzymes:  Recent Labs  11/01/15 1755 11/02/15 0425 11/02/15 1048  TROPONINI 0.42* 1.23* 0.92*   BNP: Invalid input(s):  POCBNP D-Dimer: No results for input(s): DDIMER in the last 72 hours. Hemoglobin A1C: No results for input(s): HGBA1C in the last 72 hours. Fasting Lipid Panel:  Recent Labs  11/02/15 0425  CHOL 191  HDL 30*  LDLCALC 126*  TRIG 176*  CHOLHDL 6.4   Thyroid Function Tests:  Recent Labs  11/02/15 0425  TSH 1.703   Anemia Panel: No results for input(s): VITAMINB12, FOLATE, FERRITIN, TIBC, IRON, RETICCTPCT in the last 72 hours. Coag Panel:   Lab Results  Component Value Date   INR 1.16 11/02/2015    RADIOLOGY: Dg Chest Port 1 View  11/01/2015  CLINICAL DATA:  82 year old presenting with tachycardia, shortness of breath, fever and chills, acute onset. EXAM: PORTABLE CHEST 1 VIEW COMPARISON:  04/06/2014, 05/07/2013. FINDINGS: Cardiac silhouette upper normal in size, unchanged. Thoracic aorta tortuous and mildly atherosclerotic, unchanged. Thick walled cavity in the right lung apex without air-fluid level. Patchy airspace opacities in the left upper lobe. Lungs otherwise clear. No visible pleural effusions. IMPRESSION: 1. Thick walled cavity involving the right lung apex without an associated air-fluid level. Differential diagnosis includes malignancy and lung abscess.  2. Patchy pneumonia involving the left upper lobe. Electronically Signed   By: Evangeline Dakin M.D.   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 Her troponin elevation may just be from demand ischemia due to the tachycardia. If she had a severe coronary stenosis, I would've expected to see a wall motion abnormality.  Continue low-dose metoprolol for rate control.  Depending on her clinical course and the outcome of her pulmonary evaluation, would consider cardiac  cath versus pharmacologic stress to evaluate for any large areas of ischemia.  2) cavitary lung lesion itches new from last year: Pulmonary consult pending. She is on airborne precautions for TB. She did not bring up any sputum today. Question whether she may need bronchoscopy at some point.  Jettie Booze, MD  11/02/2015  1:44 PM

## 2015-11-02 NOTE — Progress Notes (Signed)
ANTICOAGULATION CONSULT NOTE - Follow Up Consult  Pharmacy Consult for Heparin  Indication: chest pain/ACS  Allergies  Allergen Reactions  . Nabumetone Itching and Swelling    Patient Measurements: Height: '5\' 3"'$  (160 cm) Weight: 181 lb 7 oz (82.3 kg) IBW/kg (Calculated) : 52.4  Vital Signs: Temp: 97.9 F (36.6 C) (01/06 1234) Temp Source: Oral (01/06 1234) BP: 147/67 mmHg (01/06 1234) Pulse Rate: 88 (01/06 1234)  Labs:  Recent Labs  11/01/15 1510 11/01/15 1755 11/02/15 0425 11/02/15 1048 11/02/15 1452  HGB 14.6  --  13.6 13.8  --   HCT 46.2*  --  42.3 43.6  --   PLT 311  --  232 219  --   APTT  --   --  49*  --   --   LABPROT  --   --  15.0  --   --   INR  --   --  1.16  --   --   HEPARINUNFRC  --   --  0.20*  --  0.36  CREATININE 1.17*  --  0.88 0.78  --   TROPONINI 0.03 0.42* 1.23* 0.92*  --     Estimated Creatinine Clearance: 53.2 mL/min (by C-G formula based on Cr of 0.78).  Assessment: 82 year old female continues on heparin for chest pain PM HL therapeutic at 0.36  Goal of Therapy:  Heparin level 0.3-0.7 units/ml Monitor platelets by anticoagulation protocol: Yes   Plan:  Increase heparin to 1100 units / hr Follow up AM labs  Thank you Anette Guarneri, PharmD [phone] 11/02/2015,3:59 PM

## 2015-11-02 NOTE — Progress Notes (Signed)
  Echocardiogram 2D Echocardiogram has been performed.  Jennette Dubin 11/02/2015, 11:47 AM

## 2015-11-03 LAB — CBC
HCT: 42 % (ref 36.0–46.0)
Hemoglobin: 13.5 g/dL (ref 12.0–15.0)
MCH: 28.1 pg (ref 26.0–34.0)
MCHC: 32.1 g/dL (ref 30.0–36.0)
MCV: 87.5 fL (ref 78.0–100.0)
Platelets: 220 10*3/uL (ref 150–400)
RBC: 4.8 MIL/uL (ref 3.87–5.11)
RDW: 13.5 % (ref 11.5–15.5)
WBC: 7.9 10*3/uL (ref 4.0–10.5)

## 2015-11-03 LAB — HEPARIN LEVEL (UNFRACTIONATED): Heparin Unfractionated: 0.33 IU/mL (ref 0.30–0.70)

## 2015-11-03 NOTE — Progress Notes (Signed)
Pt a/o, no c/o pain, pt remains on heparin gtt @ 11 ml/hr, VSS, pt stable

## 2015-11-03 NOTE — Progress Notes (Signed)
SUBJECTIVE:  No complaints  OBJECTIVE:   Vitals:   Filed Vitals:   11/02/15 1949 11/02/15 2357 11/03/15 0500 11/03/15 1003  BP: 125/54 118/43 137/58 123/60  Pulse: 88 54 50 60  Temp: 98.3 F (36.8 C)  97.9 F (36.6 C)   TempSrc: Oral  Oral   Resp: '18 16 16   '$ Height:      Weight:   179 lb 14.3 oz (81.6 kg)   SpO2: 98% 94% 100%    I&O's:   Intake/Output Summary (Last 24 hours) at 11/03/15 1138 Last data filed at 11/03/15 0500  Gross per 24 hour  Intake    660 ml  Output    503 ml  Net    157 ml   TELEMETRY: Reviewed telemetry pt in NSR:     PHYSICAL EXAM General: Well developed, well nourished, in no acute distress Head: Eyes PERRLA, No xanthomas.   Normal cephalic and atramatic  Lungs:   Clear bilaterally to auscultation and percussion. Heart:   HRRR S1 S2 Pulses are 2+ & equal. Abdomen: Bowel sounds are positive, abdomen soft and non-tender without masses  Extremities:   No clubbing, cyanosis or edema.  DP +1 Neuro: Alert and oriented X 3. Psych:  Good affect, responds appropriately   LABS: Basic Metabolic Panel:  Recent Labs  11/02/15 0425 11/02/15 1048  NA 142 142  K 3.8 4.0  CL 110 110  CO2 23 24  GLUCOSE 116* 110*  BUN 13 13  CREATININE 0.88 0.78  CALCIUM 8.7* 8.9  MG 2.0  --    Liver Function Tests:  Recent Labs  11/02/15 0425  AST 28  ALT 15  ALKPHOS 59  BILITOT 1.0  PROT 6.2*  ALBUMIN 3.7   No results for input(s): LIPASE, AMYLASE in the last 72 hours. CBC:  Recent Labs  11/01/15 1510 11/02/15 0425 11/02/15 1048 11/03/15 0415  WBC 11.4* 8.4 7.0 7.9  NEUTROABS 6.5 3.4  --   --   HGB 14.6 13.6 13.8 13.5  HCT 46.2* 42.3 43.6 42.0  MCV 87.8 87.0 87.4 87.5  PLT 311 232 219 220   Cardiac Enzymes:  Recent Labs  11/02/15 0425 11/02/15 1048 11/02/15 1452  TROPONINI 1.23* 0.92* 0.74*   BNP: Invalid input(s): POCBNP D-Dimer: No results for input(s): DDIMER in the last 72 hours. Hemoglobin A1C: No results for input(s):  HGBA1C in the last 72 hours. Fasting Lipid Panel:  Recent Labs  11/02/15 0425  CHOL 191  HDL 30*  LDLCALC 126*  TRIG 176*  CHOLHDL 6.4   Thyroid Function Tests:  Recent Labs  11/02/15 0425  TSH 1.703   Anemia Panel: No results for input(s): VITAMINB12, FOLATE, FERRITIN, TIBC, IRON, RETICCTPCT in the last 72 hours. Coag Panel:   Lab Results  Component Value Date   INR 1.16 11/02/2015    RADIOLOGY: Ct Chest Wo Contrast  11/02/2015  CLINICAL DATA:  Follow-up cavitary apical right upper lobe lesion. COPD. EXAM: CT CHEST WITHOUT CONTRAST TECHNIQUE: Multidetector CT imaging of the chest was performed following the standard protocol without IV contrast. COMPARISON:  11/01/2015 chest radiograph. FINDINGS: Mediastinum/Nodes: Normal heart size. No pericardial fluid/thickening. Coronary atherosclerosis. Mildly dilated main pulmonary artery (3.1 cm diameter). Atherosclerotic nonaneurysmal thoracic aorta. Normal visualized thyroid. Normal esophagus. No axillary adenopathy. No pathologically enlarged mediastinal or gross hilar lymph nodes. There are scattered coarse calcifications within nonenlarged aortopulmonary window and left hilar lymph nodes, likely from prior granulomatous disease. Lungs/Pleura: No pneumothorax. No pleural effusion. Mild centrilobular  and paraseptal emphysema. Diffuse bronchial wall thickening. There is a 6.1 x 6.1 cm irregular thick walled cavitary mass in the posterior apical right upper lobe, which abuts the posterior pleural (series 3/image 11), which demonstrates a few mildly thickened irregular internal septations. There is irregular confluent subpleural nodular consolidation in the posterior left upper lobe measuring up to 2.6 x 2.0 cm in axial dimensions (series 3/image 18). No additional acute consolidative airspace disease or additional pulmonary nodules. Upper abdomen: Probable tiny hiatal hernia. Simple lobulated 6.0 cm liver cyst in the lateral segment liver lower  lobe. Additional simple 2.3 cm left liver lobe cyst. Diffuse hepatic steatosis. Musculoskeletal: No aggressive appearing focal osseous lesions. Mild degenerative changes in the thoracic spine. IMPRESSION: 1. Irregular thick-walled 6.1 cm cavitary lung mass in the apical right upper lobe. Irregular confluent subpleural nodular consolidation in the posterior left upper lobe. Differential considerations include a cavitary infection such as due to mycobacterial or fungal disease or a cavitary right upper lobe bronchogenic carcinoma with contralateral metastatic disease. 2. No thoracic adenopathy on this noncontrast study. 3. Mild centrilobular and paraseptal emphysema and diffuse bronchial wall thickening, in keeping with known COPD. 4. Coronary atherosclerosis. 5. Dilated main pulmonary artery, suggesting pulmonary arterial hypertension. 6. Diffuse hepatic steatosis. Electronically Signed   By: Ilona Sorrel M.D.   On: 11/02/2015 17:05   Dg Chest Port 1 View  11/01/2015  CLINICAL DATA:  82 year old presenting with tachycardia, shortness of breath, fever and chills, acute onset. EXAM: PORTABLE CHEST 1 VIEW COMPARISON:  04/06/2014, 05/07/2013. FINDINGS: Cardiac silhouette upper normal in size, unchanged. Thoracic aorta tortuous and mildly atherosclerotic, unchanged. Thick walled cavity in the right lung apex without air-fluid level. Patchy airspace opacities in the left upper lobe. Lungs otherwise clear. No visible pleural effusions. IMPRESSION: 1. Thick walled cavity involving the right lung apex without an associated air-fluid level. Differential diagnosis includes malignancy and lung abscess. 2. Patchy pneumonia involving the left upper lobe. Electronically Signed   By: Evangeline Dakin M.D.   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 Her troponin elevation may just be from demand ischemia due to the tachycardia. It is a relatively flat trend.  If she had a severe coronary stenosis, I would've expected to see a wall motion abnormality.  Continue low-dose metoprolol for rate control.  Depending on her clinical course and the outcome of her pulmonary evaluation, would consider cardiac cath versus pharmacologic stress to evaluate for any large areas of ischemia prior to discharge.  3) cavitary lung lesion  new from last year: Plan for bronchoscopy on Monday per pulmonary.   She is on airborne precautions for TB.   Sueanne Margarita, MD  11/03/2015  11:38 AM

## 2015-11-03 NOTE — Progress Notes (Signed)
Pt's son requesting update from MD.  On call MD paged and spoke with pt's son Trudee Grip for update.  Karie Kirks, Therapist, sports.

## 2015-11-03 NOTE — Progress Notes (Signed)
Pt non English speaking,  Able to communicate basic needs and answer yes or no when questions .  Karie Kirks, Therapist, sports.

## 2015-11-03 NOTE — Progress Notes (Signed)
ANTICOAGULATION CONSULT NOTE - Follow Up Consult  Pharmacy Consult for Heparin  Indication: chest pain/ACS  Allergies  Allergen Reactions  . Nabumetone Itching and Swelling    Patient Measurements: Height: '5\' 3"'$  (160 cm) Weight: 179 lb 14.3 oz (81.6 kg) IBW/kg (Calculated) : 52.4  Vital Signs: Temp: 97.9 F (36.6 C) (01/07 0500) Temp Source: Oral (01/07 0500) BP: 137/58 mmHg (01/07 0500) Pulse Rate: 50 (01/07 0500)  Labs:  Recent Labs  11/01/15 1510  11/02/15 0425 11/02/15 1048 11/02/15 1452 11/03/15 0415  HGB 14.6  --  13.6 13.8  --  13.5  HCT 46.2*  --  42.3 43.6  --  42.0  PLT 311  --  232 219  --  220  APTT  --   --  49*  --   --   --   LABPROT  --   --  15.0  --   --   --   INR  --   --  1.16  --   --   --   HEPARINUNFRC  --   --  0.20*  --  0.36 0.33  CREATININE 1.17*  --  0.88 0.78  --   --   TROPONINI 0.03  < > 1.23* 0.92* 0.74*  --   < > = values in this interval not displayed.  Estimated Creatinine Clearance: 53 mL/min (by C-G formula based on Cr of 0.78).  Assessment: 82 year old female continues on heparin for chest pain  AM HL therapeutic (0.33) on Heparin 1100 units/hr. Hgb stable 13.5, PLT 220. No bleeding noted.   Goal of Therapy:  Heparin level 0.3-0.7 units/ml Monitor platelets by anticoagulation protocol: Yes   Plan:  Increase heparin to 1150 units / hr Daily HL/CBC Monitor for s/s of bleeding   Siara Gorder C. Lennox Grumbles, PharmD Pharmacy Resident  Pager: [phone] 11/03/2015 9:04 AM

## 2015-11-03 NOTE — Progress Notes (Signed)
pts granddaughter stated that pt has taken the TB vaccine about 6-7 years ago, therefore her PPD will most likely be positive

## 2015-11-04 LAB — CBC
HCT: 42.4 % (ref 36.0–46.0)
Hemoglobin: 13.1 g/dL (ref 12.0–15.0)
MCH: 27.6 pg (ref 26.0–34.0)
MCHC: 30.9 g/dL (ref 30.0–36.0)
MCV: 89.5 fL (ref 78.0–100.0)
Platelets: 232 10*3/uL (ref 150–400)
RBC: 4.74 MIL/uL (ref 3.87–5.11)
RDW: 13.5 % (ref 11.5–15.5)
WBC: 7.8 10*3/uL (ref 4.0–10.5)

## 2015-11-04 LAB — HEPARIN LEVEL (UNFRACTIONATED)
Heparin Unfractionated: 0.3 IU/mL (ref 0.30–0.70)
Heparin Unfractionated: 0.42 IU/mL (ref 0.30–0.70)

## 2015-11-04 MED ORDER — HEPARIN SODIUM (PORCINE) 5000 UNIT/ML IJ SOLN: 5000.0000 [IU] | Freq: Three times a day (TID) | INTRAMUSCULAR | Status: DC

## 2015-11-04 MED ORDER — HEPARIN SODIUM (PORCINE) 5000 UNIT/ML IJ SOLN
5000.0000 [IU] | Freq: Three times a day (TID) | INTRAMUSCULAR | Status: DC
  Administered 2015-11-04 – 2015-11-07 (×8): 5000 [IU] via SUBCUTANEOUS
  Filled 2015-11-04 (×8): qty 1

## 2015-11-04 NOTE — Progress Notes (Signed)
ANTICOAGULATION CONSULT NOTE - Follow Up Consult  Pharmacy Consult for Heparin  Indication: chest pain/ACS  Allergies  Allergen Reactions  . Nabumetone Itching and Swelling    Patient Measurements: Height: '5\' 3"'$  (160 cm) Weight: 181 lb 3.2 oz (82.192 kg) (scale c) IBW/kg (Calculated) : 52.4  Vital Signs: Temp: 98 F (36.7 C) (01/08 0411) Temp Source: Oral (01/08 0411) BP: 134/51 mmHg (01/08 0411) Pulse Rate: 55 (01/08 0411)  Labs:  Recent Labs  11/01/15 1510  11/02/15 0425 11/02/15 1048 11/02/15 1452 11/03/15 0415 11/04/15 0315  HGB 14.6  --  13.6 13.8  --  13.5 13.1  HCT 46.2*  --  42.3 43.6  --  42.0 42.4  PLT 311  --  232 219  --  220 232  APTT  --   --  49*  --   --   --   --   LABPROT  --   --  15.0  --   --   --   --   INR  --   --  1.16  --   --   --   --   HEPARINUNFRC  --   < > 0.20*  --  0.36 0.33 0.30  CREATININE 1.17*  --  0.88 0.78  --   --   --   TROPONINI 0.03  < > 1.23* 0.92* 0.74*  --   --   < > = values in this interval not displayed.  Estimated Creatinine Clearance: 53.1 mL/min (by C-G formula based on Cr of 0.78).  Assessment: 82 year old female continues on heparin for chest pain  AM HL SUBtherapeutic (0.30) on Heparin 1150 units/hr. Hgb stable 13.1, PLT 232. No bleeding noted.  Goal of Therapy:  Heparin level 0.3-0.7 units/ml Monitor platelets by anticoagulation protocol: Yes   Plan:  Increase heparin to 1250 units / hr 6h HL if not discontinued  Daily HL/CBC Monitor for s/s of bleeding   Carmeron Heady C. Lennox Grumbles, PharmD Pharmacy Resident  Pager: [phone] 11/04/2015 8:19 AM

## 2015-11-04 NOTE — Progress Notes (Addendum)
SUBJECTIVE:  No complaints  OBJECTIVE:   Vitals:   Filed Vitals:   11/03/15 1939 11/04/15 0411 11/04/15 0747 11/04/15 0903  BP: 150/55 134/51  120/49  Pulse: 60 55  58  Temp: 98.2 F (36.8 C) 98 F (36.7 C)    TempSrc: Oral Oral    Resp: 18 18    Height:      Weight:  181 lb 3.2 oz (82.192 kg)    SpO2: 100% 100% 98%    I&O's:   Intake/Output Summary (Last 24 hours) at 11/04/15 1118 Last data filed at 11/04/15 0300  Gross per 24 hour  Intake 584.47 ml  Output   1050 ml  Net -465.53 ml   TELEMETRY: Reviewed telemetry pt in NSR     PHYSICAL EXAM General: Well developed, well nourished, in no acute distress Head: Eyes PERRLA, No xanthomas.   Normal cephalic and atramatic  Lungs:   Clear bilaterally to auscultation and percussion. Heart:   HRRR S1 S2 Pulses are 2+ & equal. Abdomen: Bowel sounds are positive, abdomen soft and non-tender without masses Extremities:   No clubbing, cyanosis or edema.  DP +1 Neuro: Alert and oriented X 3. Psych:  Good affect, responds appropriately   LABS: Basic Metabolic Panel:  Recent Labs  11/02/15 0425 11/02/15 1048  NA 142 142  K 3.8 4.0  CL 110 110  CO2 23 24  GLUCOSE 116* 110*  BUN 13 13  CREATININE 0.88 0.78  CALCIUM 8.7* 8.9  MG 2.0  --    Liver Function Tests:  Recent Labs  11/02/15 0425  AST 28  ALT 15  ALKPHOS 59  BILITOT 1.0  PROT 6.2*  ALBUMIN 3.7   No results for input(s): LIPASE, AMYLASE in the last 72 hours. CBC:  Recent Labs  11/01/15 1510 11/02/15 0425  11/03/15 0415 11/04/15 0315  WBC 11.4* 8.4  < > 7.9 7.8  NEUTROABS 6.5 3.4  --   --   --   HGB 14.6 13.6  < > 13.5 13.1  HCT 46.2* 42.3  < > 42.0 42.4  MCV 87.8 87.0  < > 87.5 89.5  PLT 311 232  < > 220 232  < > = values in this interval not displayed. Cardiac Enzymes:  Recent Labs  11/02/15 0425 11/02/15 1048 11/02/15 1452  TROPONINI 1.23* 0.92* 0.74*   BNP: Invalid input(s): POCBNP D-Dimer: No results for input(s): DDIMER  in the last 72 hours. Hemoglobin A1C: No results for input(s): HGBA1C in the last 72 hours. Fasting Lipid Panel:  Recent Labs  11/02/15 0425  CHOL 191  HDL 30*  LDLCALC 126*  TRIG 176*  CHOLHDL 6.4   Thyroid Function Tests:  Recent Labs  11/02/15 0425  TSH 1.703   Anemia Panel: No results for input(s): VITAMINB12, FOLATE, FERRITIN, TIBC, IRON, RETICCTPCT in the last 72 hours. Coag Panel:   Lab Results  Component Value Date   INR 1.16 11/02/2015    RADIOLOGY: Ct Chest Wo Contrast  11/02/2015  CLINICAL DATA:  Follow-up cavitary apical right upper lobe lesion. COPD. EXAM: CT CHEST WITHOUT CONTRAST TECHNIQUE: Multidetector CT imaging of the chest was performed following the standard protocol without IV contrast. COMPARISON:  11/01/2015 chest radiograph. FINDINGS: Mediastinum/Nodes: Normal heart size. No pericardial fluid/thickening. Coronary atherosclerosis. Mildly dilated main pulmonary artery (3.1 cm diameter). Atherosclerotic nonaneurysmal thoracic aorta. Normal visualized thyroid. Normal esophagus. No axillary adenopathy. No pathologically enlarged mediastinal or gross hilar lymph nodes. There are scattered coarse calcifications within nonenlarged aortopulmonary  window and left hilar lymph nodes, likely from prior granulomatous disease. Lungs/Pleura: No pneumothorax. No pleural effusion. Mild centrilobular and paraseptal emphysema. Diffuse bronchial wall thickening. There is a 6.1 x 6.1 cm irregular thick walled cavitary mass in the posterior apical right upper lobe, which abuts the posterior pleural (series 3/image 11), which demonstrates a few mildly thickened irregular internal septations. There is irregular confluent subpleural nodular consolidation in the posterior left upper lobe measuring up to 2.6 x 2.0 cm in axial dimensions (series 3/image 18). No additional acute consolidative airspace disease or additional pulmonary nodules. Upper abdomen: Probable tiny hiatal hernia. Simple  lobulated 6.0 cm liver cyst in the lateral segment liver lower lobe. Additional simple 2.3 cm left liver lobe cyst. Diffuse hepatic steatosis. Musculoskeletal: No aggressive appearing focal osseous lesions. Mild degenerative changes in the thoracic spine. IMPRESSION: 1. Irregular thick-walled 6.1 cm cavitary lung mass in the apical right upper lobe. Irregular confluent subpleural nodular consolidation in the posterior left upper lobe. Differential considerations include a cavitary infection such as due to mycobacterial or fungal disease or a cavitary right upper lobe bronchogenic carcinoma with contralateral metastatic disease. 2. No thoracic adenopathy on this noncontrast study. 3. Mild centrilobular and paraseptal emphysema and diffuse bronchial wall thickening, in keeping with known COPD. 4. Coronary atherosclerosis. 5. Dilated main pulmonary artery, suggesting pulmonary arterial hypertension. 6. Diffuse hepatic steatosis. Electronically Signed   By: Ilona Sorrel M.D.   On: 11/02/2015 17:05   Dg Chest Port 1 View  11/01/2015  CLINICAL DATA:  82 year old presenting with tachycardia, shortness of breath, fever and chills, acute onset. EXAM: PORTABLE CHEST 1 VIEW COMPARISON:  04/06/2014, 05/07/2013. FINDINGS: Cardiac silhouette upper normal in size, unchanged. Thoracic aorta tortuous and mildly atherosclerotic, unchanged. Thick walled cavity in the right lung apex without air-fluid level. Patchy airspace opacities in the left upper lobe. Lungs otherwise clear. No visible pleural effusions. IMPRESSION: 1. Thick walled cavity involving the right lung apex without an associated air-fluid level. Differential diagnosis includes malignancy and lung abscess. 2. Patchy pneumonia involving the left upper lobe. Electronically Signed   By: Evangeline Dakin M.D.   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  Continue low dose BB .  2) Elevated troponin in the setting of AVT - Echocardiogram shows normal left ventricular function without regional wall motion abnormality. 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 Her troponin elevation may just be from demand ischemia due to the tachycardia. It is a relatively flat trend. If she had a severe coronary stenosis, I would've expected to see a wall motion abnormality. Continue low-dose metoprolol for rate control.Change IV Heparin to SQ for DVT prophylaxis.   Depending on her clinical course and the outcome of her pulmonary evaluation, would consider cardiac cath versus pharmacologic stress to evaluate for any large areas of ischemia prior to discharge.  3) cavitary lung lesion new from last year: Plan for bronchoscopy on Monday per pulmonary. She is on airborne precautions for TB.    Sueanne Margarita, MD  11/04/2015  11:18 AM

## 2015-11-04 NOTE — Progress Notes (Signed)
Utilization review completed.  

## 2015-11-05 ENCOUNTER — Encounter (HOSPITAL_COMMUNITY)
Admission: EM | Disposition: A | Discharge status: Home / Self Care | Payer: Self-pay | Source: Home / Self Care | Attending: Cardiology

## 2015-11-05 ENCOUNTER — Inpatient Hospital Stay (HOSPITAL_COMMUNITY): Payer: Medicaid Other

## 2015-11-05 ENCOUNTER — Encounter (HOSPITAL_COMMUNITY): Payer: Self-pay | Admitting: Pulmonary Disease

## 2015-11-05 HISTORY — PX: VIDEO BRONCHOSCOPY: SHX5072

## 2015-11-05 LAB — URINE CULTURE

## 2015-11-05 LAB — QUANTIFERON IN TUBE
QFT TB AG MINUS NIL VALUE: 0.08 IU/mL
QFT TB AG MINUS NIL VALUE: 0 IU/mL
QUANTIFERON MITOGEN VALUE: >10 IU/mL
QUANTIFERON MITOGEN VALUE: >10 IU/mL
QUANTIFERON TB AG VALUE: 0.14 IU/mL
QUANTIFERON TB AG VALUE: 0.03 IU/mL
QUANTIFERON TB GOLD: NEGATIVE
QUANTIFERON TB GOLD: NEGATIVE
Quantiferon Nil Value: 0.06 IU/mL
Quantiferon Nil Value: 0.03 IU/mL

## 2015-11-05 LAB — CBC
HCT: 42.3 % (ref 36.0–46.0)
Hemoglobin: 13.5 g/dL (ref 12.0–15.0)
MCH: 28.1 pg (ref 26.0–34.0)
MCHC: 31.9 g/dL (ref 30.0–36.0)
MCV: 88.1 fL (ref 78.0–100.0)
Platelets: 214 10*3/uL (ref 150–400)
RBC: 4.8 MIL/uL (ref 3.87–5.11)
RDW: 13.3 % (ref 11.5–15.5)
WBC: 7 10*3/uL (ref 4.0–10.5)

## 2015-11-05 LAB — QUANTIFERON TB GOLD ASSAY (BLOOD)

## 2015-11-05 SURGERY — VIDEO BRONCHOSCOPY WITHOUT FLUORO
Anesthesia: Moderate Sedation | Laterality: Bilateral

## 2015-11-05 MED ORDER — FENTANYL CITRATE (PF) 100 MCG/2ML IJ SOLN
INTRAMUSCULAR | Status: DC | PRN
  Administered 2015-11-05 (×2): 25 ug via INTRAVENOUS

## 2015-11-05 MED ORDER — FENTANYL CITRATE (PF) 100 MCG/2ML IJ SOLN
INTRAMUSCULAR | Status: AC
  Filled 2015-11-05: qty 4

## 2015-11-05 MED ORDER — MIDAZOLAM HCL 10 MG/2ML IJ SOLN
INTRAMUSCULAR | Status: DC | PRN
  Administered 2015-11-05 (×3): 1 mg via INTRAVENOUS

## 2015-11-05 MED ORDER — LIDOCAINE HCL (PF) 1 % IJ SOLN
INTRAMUSCULAR | Status: DC | PRN
  Administered 2015-11-05: 10 mL

## 2015-11-05 MED ORDER — MIDAZOLAM HCL 5 MG/ML IJ SOLN
INTRAMUSCULAR | Status: AC
  Filled 2015-11-05: qty 2

## 2015-11-05 MED ORDER — SODIUM CHLORIDE 0.9 % IV SOLN
Freq: Once | INTRAVENOUS | Status: AC
  Administered 2015-11-05: 06:00:00 via INTRAVENOUS

## 2015-11-05 NOTE — Progress Notes (Signed)
Video bronchoscopy performed.  Intervention bronchial washing.   No complications noted.  Will continue to monitor. 

## 2015-11-05 NOTE — Evaluation (Signed)
Clinical/Bedside Swallow Evaluation Patient Details  Name: Brooke Mora MRN: [id number] Date of Birth: 06-23-1929  Today's Date: 11/05/2015 Time: SLP Start Time (ACUTE ONLY): 1320 SLP Stop Time (ACUTE ONLY): 1332 SLP Time Calculation (min) (ACUTE ONLY): 12 min  Past Medical History:  Past Medical History  Diagnosis Date  . Kidney stones   . GERD (gastroesophageal reflux disease)   . High cholesterol   . COPD (chronic obstructive pulmonary disease) (HCC)     slight   . Hypertension     patient states b/p up and down not on meds   Past Surgical History:  Past Surgical History  Procedure Laterality Date  . Hernia repair    . Knee surgery    . Abdominal hysterectomy    . Cataract extraction    . Incisional hernia repair N/A 07/18/2014    Procedure: REPAIR OF INACERATED INCISIONAL HERNIA WITH MESH;  Surgeon: Armandina Gemma, MD;  Location: [facility];  Service: General;  Laterality: N/A;  . Insertion of mesh N/A 07/18/2014    Procedure: INSERTION OF MESH;  Surgeon: Armandina Gemma, MD;  Location: [facility];  Service: General;  Laterality: N/A;   HPI:  82 y/o, former smoker, spanish speaking F (originally from [place]) with PMH of GERD, hernia repair, kidney stones, HLD, HTN, and COPD admitted on 1/5 with palpitations, SVT and SOB. The patient presented to [place] on 1/5 with reports of nausea, dizziness, and shortness of breath for 6 hours. She reports she was drinking coffee at onset of symptoms. She noted palpitation / sensation of heart racing. She denied syncope / pre-syncope. Prior to episode she was in her usual state of health. Initial CXR was notable for a thick walled RUL cavitary lesion & patchy L lung opacity. Pt underwent Bronchoscopy and MD ordered swallow eval prior to resumption of diet.    Assessment / Plan / Recommendation Clinical Impression  Pt demonstrates no overt signs concerning for aspiration or dysphagia. Pts voice is clear and strong following bronchoscopy and she  reports a feeling of only minor irritation. Pt does exhibit some coughing throughout session, though not directly related to PO intake. She has been coughing frequently at baseline. Pt may initiate a regular diet and thin liquids. No SLP f/u needed, will sign off.     Aspiration Risk  Mild aspiration risk    Diet Recommendation Regular;Thin liquid   Liquid Administration via: Cup;Straw Medication Administration: Whole meds with liquid Supervision: Patient able to self feed Postural Changes: Seated upright at 90 degrees    Other  Recommendations     Follow up Recommendations  None    Frequency and Duration            Prognosis        Swallow Study   General HPI: 82 y/o, former smoker, spanish speaking F (originally from [place]) with PMH of GERD, hernia repair, kidney stones, HLD, HTN, and COPD admitted on 1/5 with palpitations, SVT and SOB. The patient presented to [place] on 1/5 with reports of nausea, dizziness, and shortness of breath for 6 hours. She reports she was drinking coffee at onset of symptoms. She noted palpitation / sensation of heart racing. She denied syncope / pre-syncope. Prior to episode she was in her usual state of health. Initial CXR was notable for a thick walled RUL cavitary lesion & patchy L lung opacity. Pt underwent Bronchoscopy and MD ordered swallow eval prior to resumption of diet.  Type of Study: Bedside Swallow  Evaluation Previous Swallow Assessment: none Diet Prior to this Study: NPO Temperature Spikes Noted: No Respiratory Status: Nasal cannula History of Recent Intubation:  (bronchoscopy this am ) Behavior/Cognition: Alert;Cooperative;Pleasant mood Oral Cavity Assessment: Within Functional Limits Oral Care Completed by SLP: No Oral Cavity - Dentition: Dentures, top Vision: Functional for self-feeding Self-Feeding Abilities: Able to feed self Patient Positioning: Upright in bed Baseline Vocal Quality: Normal Volitional Cough:  Strong Volitional Swallow: Able to elicit    Oral/Motor/Sensory Function Overall Oral Motor/Sensory Function: Within functional limits   Ice Chips     Thin Liquid Thin Liquid: Within functional limits Presentation: Cup;Straw;Self Fed    Nectar Thick Nectar Thick Liquid: Not tested   Honey Thick Honey Thick Liquid: Not tested   Puree Puree: Within functional limits   Solid   GO    Solid: Within functional limits      [facility], MA CCC-SLP [phone]  Adelis Docter, Katherene Ponto 11/05/2015,1:44 PM

## 2015-11-05 NOTE — Progress Notes (Signed)
Pt son requesting f/u on bronc done today.  Notified Dr. Jackqulyn Livings and he instructed will ask Dr. Ashok Cordia to call and speak with son.  Pt's son made aware.  Will continue to monitor.  Karie Kirks, Therapist, sports.

## 2015-11-05 NOTE — Progress Notes (Signed)
Order for  BSS eval by RN.  Instructed by RRN that can be done if pt was stroke pt.  Informed by Dr. Mare Ferrari and gave order for ST to do BSS eval.  ST aware and called again that order obtained, spoke with Larene Beach, stated will notify ST.  Will continue to monitor.  Karie Kirks, Therapist, sports.

## 2015-11-05 NOTE — Consult Note (Signed)
Name: Brooke Mora MRN: [id number] DOB: 11/26/1928    ADMISSION DATE:  11/01/2015 CONSULTATION DATE:  11/02/15  REFERRING MD :  Dr. Mare Ferrari / Cardiology   CHIEF COMPLAINT:  Abnormal CXR   HISTORY OF PRESENT ILLNESS:  82 y/o, former smoker, spanish speaking F (originally from [place]) with PMH of GERD, hernia repair, kidney stones, HLD, HTN, and COPD admitted on 1/5 with palpitations, SVT and SOB.   The patient presented to [place] on 1/5 with reports of nausea, dizziness, and shortness of breath for 6 hours.  She reports she was drinking coffee at onset of symptoms.  She noted palpitation / sensation of heart racing.  She denied syncope / pre-syncope.  Prior to episode she was in her usual state of health.    She is a former smoker - 59 years x1/2 PPD.  She quit smoking in 2007.  She denies baseline SOB, cough, sputum production.  Her former husband had tuberculosis approximately 30 years ago and was treated.  She denies ever being treated or symptoms.  Currently denies night sweats, weight loss, fevers/chills, hemoptysis, n/v/d, and lower extremity swelling.  The patient was admitted was admitted per Cardiology for SVT.  She was treated in the ER with adenosine x2 with break of SVT and return to NSR. Initial labs - WBC 11.4, platelets 311, NA 142, K 4, sr cr 0.78, and Troponin  0.42.  Given elevated troponin, the patient was started on a heparin gtt per Cardiology.  Initial CXR was notable for a thick walled RUL cavitary lesion & patchy L lung opacity.    PCCM consulted for evaluation of RUL cavitary lesion / abnormal CXR.   PAST MEDICAL HISTORY :   has a past medical history of Kidney stones; GERD (gastroesophageal reflux disease); High cholesterol; COPD (chronic obstructive pulmonary disease) ([place]); and Hypertension.  has past surgical history that includes Hernia repair; Knee surgery; Abdominal hysterectomy; Cataract extraction; Incisional hernia repair (N/A, 07/18/2014); and  Insertion of mesh (N/A, 07/18/2014).   Prior to Admission medications   Medication Sig Start Date End Date Taking? Authorizing Provider  ibuprofen (ADVIL,MOTRIN) 400 MG tablet Take 400 mg by mouth every 6 (six) hours as needed for mild pain.   Yes Historical Provider, MD   Allergies  Allergen Reactions  . Nabumetone Itching and Swelling    FAMILY HISTORY:  family history is not on file.   SOCIAL HISTORY:  reports that she has quit smoking. She has never used smokeless tobacco. She reports that she does not drink alcohol or use illicit drugs.  REVIEW OF SYSTEMS:   Constitutional: Negative for fever, chills, weight loss, malaise/fatigue and diaphoresis.  HENT: Negative for hearing loss, ear pain, nosebleeds, congestion, sore throat, neck pain, tinnitus and ear discharge.   Eyes: Negative for blurred vision, double vision, photophobia, pain, discharge and redness.  Respiratory: Negative for cough, hemoptysis, sputum production, wheezing and stridor.  SOB on admit, now resolved Cardiovascular: Negative for chest pain, orthopnea, claudication, leg swelling and PND. Palpitations on admit, now resolved Gastrointestinal: Negative for heartburn, nausea, vomiting, abdominal pain, diarrhea, constipation, blood in stool and melena.  Genitourinary: Negative for dysuria, urgency, frequency, hematuria and flank pain.  Musculoskeletal: Negative for myalgias, back pain, joint pain and falls.  Skin: Negative for itching and rash.  Neurological: Negative for dizziness, tingling, tremors, sensory change, speech change, focal weakness, seizures, loss of consciousness, weakness and headaches.  Endo/Heme/Allergies: Negative for environmental allergies and polydipsia. Does not bruise/bleed easily.  SUBJECTIVE:  VITAL SIGNS: Temp:  [98 F (36.7 C)-98.9 F (37.2 C)] 98.2 F (36.8 C) (01/09 0927) Pulse Rate:  [50-65] 63 (01/09 0955) Resp:  [14-25] 16 (01/09 0955) BP: (132-178)/(57-101) 149/101 mmHg  (01/09 0955) SpO2:  [94 %-98 %] 96 % (01/09 0955) Weight:  [180 lb (81.647 kg)-180 lb 6.4 oz (81.829 kg)] 180 lb (81.647 kg) (01/09 0927)  PHYSICAL EXAMINATION: General:  Well developed, well nourished elderly female in NAD, appears younger than stated age Neuro:  AAOx4, speech clear, MAE, normal strength HEENT:  MM pink/moist, no jvd, no LAN Cardiovascular:  s1s2 rrr, no m/r/g Lungs:  resp's even/non-labored, lungs bilaterally clear, no wheeze  Abdomen:  Obese, soft, bsx4 active  Musculoskeletal:  No acute deformities  Skin:  Warm/dry, no edema    Recent Labs Lab 11/01/15 1510 11/02/15 0425 11/02/15 1048  NA 143 142 142  K 4.0 3.8 4.0  CL 113* 110 110  CO2 '22 23 24  '$ BUN '19 13 13  '$ CREATININE 1.17* 0.88 0.78  GLUCOSE 134* 116* 110*    Recent Labs Lab 11/03/15 0415 11/04/15 0315 11/05/15 0505  HGB 13.5 13.1 13.5  HCT 42.0 42.4 42.3  WBC 7.9 7.8 7.0  PLT 220 232 214   No results found.   SIGNIFICANT EVENTS  01/05  Admit to [facility] with palpitations, SVT s/p adenosine x2   STUDIES:  01/05  CXR >> thick walled cavity in RUL, LUL airspace disease 01/06  CT Chest >>   DISCUSSION:  82 y/o F with a PMH of former tobacco abuse, undefined COPD and prior exposure to TB admitted on 1/5 with SVT, SOB, and palpitations.  Adenosine x2 with conversion to NSR.  Incidental finding of RUL cavitary lesion and L airspace disease on CXR.  This is new from last CXR in system from 2015.  Hx of close relative exposure with TB.  DDx includes bacterial / fungal, tuberculous, autoimmune and malignancy given smoking history.  ASSESSMENT / PLAN:  RUL Cavitary Lesion LUL Airspace Disease  Suspected COPD  Former Tobacco Abuse   Plan: Assess AFB x3 Assess PPD & Quantiferon Gold  Tentatively will add for FOB on Monday 1/9 for washings @ 10 AM due to difficulty bringing up sputum Intermittent CXR Airborne precautions Wean oxygen for sats > 92% Assess CT chest to better evaluate RUL / LUL    SVT  Elevated Troponin   Plan: Per Cardiology  Pending pulmonary evaluation, may have [place], NP-C [place] Pgr: [phone] or if no answer [phone]  No change in medical history of exam since the time of consultation. Proceeding with bronchoscopy with bilateral lavage.  Sonia Baller Ashok Cordia, M.D. [facility] Pager:  [phone] After 3pm or if no response, call [phone]  11/05/2015, 10:05 AM

## 2015-11-05 NOTE — Progress Notes (Signed)
Dr. Ashok Cordia returned call and spoke with pt's daughter Nesbitt daughter stating is ok to have MD speak with her.   Karie Kirks, Therapist, sports.

## 2015-11-05 NOTE — Op Note (Signed)
Video Bronchoscopy Procedure Note  Pre-Procedure Diagnoses: 1.  Cavitary Lung Disease  Procedures Performed: 1. Bronchoscopy with Airway Inspection 2. Bronchoalveolar Lavage Right Upper Lobe  Consent:  Informed consent was obtained from the patient with the help of an interpreter after discussing the risks and benefits of the procedure including bleeding, infection, pneumothorax, medication allergy, vocal chord injury, and potentially death.  Conscious Sedation:   Time Start:  10:05am Time Stop:  10:30am  Medications Administered During Conscious Sedation: 1. Lidocaine 1% gargle 10cc 2. Lidocaine 1% 14cc via bronchoscope 3. Versed '3mg'$  IV 4. Fentanyl 72mg IV  Pre-Procedure Physical Exam: General:  No acute distress. Awake. Alert. ASA Class 2. HEENT:  Moist mucus membranes. No oral ulcers. Mallampati Class 3. Cardiovascular:  Bradycardic. Sinus rhythm on telemetry. No appreciable JVD. Pulmonary:  Clear to auscultation with good aeration bilaterally. Normal work of breathing on [place] oxygen. Abdomen:  Soft. Nontender. Nondistended. Normal bowel sounds. Musculoskeletal:  Normal bulk and tone. Normal neck flexion & extension. Neurological:  Oriented to person, place, and time. Moving all 4 extremities equally.  Description of Procedure: Patient was brought back to the endoscopy procedure room.  A time out was performed to identify the correct patient and procedure.  Lidocaine gargle was performed.  Patient was laid recumbent and conscious sedation was administered by respiratory therapist.  Bite block was inserted and towel was placed over the patient's eyes.  Flexible bronchoscope was then inserted into the posterior pharynx until vocal chords were in full view. There was no abnormality of the vocal chords or arytenoids.  A total of 6cc of Lidocaine was used to anesthetize the vocal chords.  The bronchoscope was then inserted between the vocal chords with ease into the proximal trachea.   Lidocaine was then used to anesthetize the patient's proximal airways.  An airway inspeciton was performed finding normal airways without lesion or inflammation.  The bronchoscope was then advanced into the posterior segment of the right upper lobe.  A lavage was performed by instilling a total of 120cc of sterile saline and aspirating a total of 20cc of cloudy fluid.  Patient had transient desaturation requiring increased oxygen temporarily. The flexible bronchoscope was then removed from the patient's airways after suctioning of the remaining secretions. The bite block was removed and the patient was returned to the upright position.  Blood Loss:  None.  Complications:  Patient had transient desaturation requiring increased supplemental oxygen.  Bronchoalveolar Lavage: 1. Performed on the Right Upper Lobe:  Sent for Cytology, Fungal smear & culture, AFB smear & culture, and routine culture.  Post Procedure Instructions: 1. Remain NPO for 1 hour until she can pass a bedside swallow evaluation by her nurse then can resume previous diet. 2. Followup cytology & culture specimens from lavage. 3. Consider PET CT scan as an outpatient if concern for malignancy remains.

## 2015-11-05 NOTE — Progress Notes (Signed)
Patient Name: Brooke Mora Date of Encounter: 11/05/2015  Principal Problem:   SVT (supraventricular tachycardia) (HCC) Active Problems:   Troponin level elevated   Cavitary lesion of lung   Primary Cardiologist: [facility] Dr Irish Lack  Patient Profile: 82 yo woman, primarily [place] speaking but has lived in [place] for 25 years originally from [place] with [place] of GERD, COPD, HTN (not on medications) and kidney stones. 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, now on BB, cavitary lung lesion (for bronch today) w/ airborne precautions, and elevated (flat) troponin  SUBJECTIVE: Pt denies chest pain or SOB (speaks mainly Spanish but is able to answer simple questions), ready for bronch  OBJECTIVE Filed Vitals:   11/04/15 0747 11/04/15 0903 11/04/15 2007 11/05/15 0603  BP:  120/49 132/60 132/57  Pulse:  58 56 50  Temp:   98 F (36.7 C) 98.9 F (37.2 C)  TempSrc:   Oral Oral  Resp:   18 19  Height:      Weight:    180 lb 6.4 oz (81.829 kg)  SpO2: 98%  98% 98%    Intake/Output Summary (Last 24 hours) at 11/05/15 0807 Last data filed at 11/05/15 1610  Gross per 24 hour  Intake      0 ml  Output    300 ml  Net   -300 ml   Filed Weights   11/03/15 0500 11/04/15 0411 11/05/15 0603  Weight: 179 lb 14.3 oz (81.6 kg) 181 lb 3.2 oz (82.192 kg) 180 lb 6.4 oz (81.829 kg)    PHYSICAL EXAM General: Well developed, well nourished, female in no acute distress. Head: Normocephalic, atraumatic.  Neck: Supple without bruits, JVD not elevated. Lungs:  Resp regular and unlabored, few rales, good air exchange. Heart: RRR, S1, S2, no S3, S4, or murmur; no rub. Abdomen: Soft, non-tender, non-distended, BS + x 4.  Extremities: No clubbing, cyanosis, edema.  Neuro: Alert and oriented X 3. Moves all extremities spontaneously. Psych: Normal affect.  LABS: CBC: Recent Labs  11/04/15 0315 11/05/15 0505  WBC 7.8 7.0  HGB 13.1 13.5  HCT 42.4 42.3  MCV 89.5 88.1  PLT 232 960   Basic Metabolic  Panel: Recent Labs  11/02/15 1048  NA 142  K 4.0  CL 110  CO2 24  GLUCOSE 110*  BUN 13  CREATININE 0.78  CALCIUM 8.9   Liver Function Tests: Lab Results  Component Value Date   ALT 15 11/02/2015   AST 28 11/02/2015   ALKPHOS 59 11/02/2015   BILITOT 1.0 11/02/2015    Cardiac Enzymes: Recent Labs  11/02/15 11/02/15 1048 11/02/15 1452  TROPONINI 1.23* 0.92* 0.74*   BNP:  B NATRIURETIC PEPTIDE  Date/Time Value Ref Range Status  11/02/2015 04:25 AM 272.0* 0.0 - 100.0 pg/mL Final  11/01/2015 03:10 PM 143.4* 0.0 - 100.0 pg/mL Final    TELE:  SR, S brady, no AVNRT since 01/    Radiology/Studies: Ct Chest Wo Contrast 11/02/2015  CLINICAL DATA:  Follow-up cavitary apical right upper lobe lesion. COPD. EXAM: CT CHEST WITHOUT CONTRAST TECHNIQUE: Multidetector CT imaging of the chest was performed following the standard protocol without IV contrast. COMPARISON:  11/01/2015 chest radiograph. FINDINGS: Mediastinum/Nodes: Normal heart size. No pericardial fluid/thickening. Coronary atherosclerosis. Mildly dilated main pulmonary artery (3.1 cm diameter). Atherosclerotic nonaneurysmal thoracic aorta. Normal visualized thyroid. Normal esophagus. No axillary adenopathy. No pathologically enlarged mediastinal or gross hilar lymph nodes. There are scattered coarse calcifications within nonenlarged aortopulmonary window and left hilar lymph nodes, likely from  prior granulomatous disease. Lungs/Pleura: No pneumothorax. No pleural effusion. Mild centrilobular and paraseptal emphysema. Diffuse bronchial wall thickening. There is a 6.1 x 6.1 cm irregular thick walled cavitary mass in the posterior apical right upper lobe, which abuts the posterior pleural (series 3/image 11), which demonstrates a few mildly thickened irregular internal septations. There is irregular confluent subpleural nodular consolidation in the posterior left upper lobe measuring up to 2.6 x 2.0 cm in axial dimensions (series 3/image  18). No additional acute consolidative airspace disease or additional pulmonary nodules. Upper abdomen: Probable tiny hiatal hernia. Simple lobulated 6.0 cm liver cyst in the lateral segment liver lower lobe. Additional simple 2.3 cm left liver lobe cyst. Diffuse hepatic steatosis. Musculoskeletal: No aggressive appearing focal osseous lesions. Mild degenerative changes in the thoracic spine. IMPRESSION: 1. Irregular thick-walled 6.1 cm cavitary lung mass in the apical right upper lobe. Irregular confluent subpleural nodular consolidation in the posterior left upper lobe. Differential considerations include a cavitary infection such as due to mycobacterial or fungal disease or a cavitary right upper lobe bronchogenic carcinoma with contralateral metastatic disease. 2. No thoracic adenopathy on this noncontrast study. 3. Mild centrilobular and paraseptal emphysema and diffuse bronchial wall thickening, in keeping with known COPD. 4. Coronary atherosclerosis. 5. Dilated main pulmonary artery, suggesting pulmonary arterial hypertension. 6. Diffuse hepatic steatosis. Electronically Signed   By: Ilona Sorrel M.D.   On: 11/02/2015 17:05   Dg Chest Port 1 View 11/01/2015  CLINICAL DATA:  82 year old presenting with tachycardia, shortness of breath, fever and chills, acute onset. EXAM: PORTABLE CHEST 1 VIEW COMPARISON:  04/06/2014, 05/07/2013. FINDINGS: Cardiac silhouette upper normal in size, unchanged. Thoracic aorta tortuous and mildly atherosclerotic, unchanged. Thick walled cavity in the right lung apex without air-fluid level. Patchy airspace opacities in the left upper lobe. Lungs otherwise clear. No visible pleural effusions. IMPRESSION: 1. Thick walled cavity involving the right lung apex without an associated air-fluid level. Differential diagnosis includes malignancy and lung abscess. 2. Patchy pneumonia involving the left upper lobe. Electronically Signed   By: Evangeline Dakin M.D.   On: 11/01/2015 15:49       Current Medications:  . aspirin EC  81 mg Oral Daily  . atorvastatin  80 mg Oral q1800  . heparin subcutaneous  5,000 Units Subcutaneous 3 times per day  . metoprolol tartrate  12.5 mg Oral BID  . sodium chloride  3 mL Intravenous Q12H  . 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 Her troponin elevation may just be from demand ischemia due to the tachycardia. If she had a severe coronary stenosis, I would've expected to see a wall motion abnormality.  Continue low-dose metoprolol for rate control.  Depending on her clinical course and the outcome of her pulmonary evaluation/bronch, consider cardiac cath versus pharmacologic stress to evaluate for any large areas of ischemia.  2) cavitary lung lesion itches new from last year: Pulmonary eval incomplete. She is on airborne precautions for TB (exposure from husband). For bronchoscopy tentatively today/   SignedRosaria Ferries , PA-C 8:07 AM 11/05/2015  Agree with above assessment and plan.  The family was present while I was examining the patient today.  The patient denies any hemoptysis or chronic fevers or night sweats.  She underwent bronchoscopy  today with bronchial washings, results pending.  Workup of her cavitary lung lesion in progress as per pulmonary evaluation.  She has had no further episodes of supraventricular tachycardia.  We are continuing low-dose metoprolol.  We will follow pulmonary's lead as to ongoing workup of her cavitary lung lesion and duration of respiratory precautions etc.

## 2015-11-06 ENCOUNTER — Encounter (HOSPITAL_COMMUNITY): Payer: Self-pay | Admitting: Pulmonary Disease

## 2015-11-06 LAB — CBC
HCT: 44.1 % (ref 36.0–46.0)
Hemoglobin: 14.3 g/dL (ref 12.0–15.0)
MCH: 28.5 pg (ref 26.0–34.0)
MCHC: 32.4 g/dL (ref 30.0–36.0)
MCV: 87.8 fL (ref 78.0–100.0)
Platelets: 224 10*3/uL (ref 150–400)
RBC: 5.02 MIL/uL (ref 3.87–5.11)
RDW: 13.4 % (ref 11.5–15.5)
WBC: 8.5 10*3/uL (ref 4.0–10.5)

## 2015-11-06 LAB — HEMOGLOBIN A1C
Hgb A1c MFr Bld: 6.2 % — ABNORMAL HIGH (ref 4.8–5.6)
Mean Plasma Glucose: 131 mg/dL

## 2015-11-06 NOTE — Progress Notes (Signed)
   Name: Brooke Mora MRN: [id number] DOB: 07-25-1929    ADMISSION DATE:  11/01/2015 CONSULTATION DATE:  11/02/15  REFERRING MD :  Dr. Mare Ferrari / Cardiology   CHIEF COMPLAINT:  Cavitary Lung Disease  SIGNIFICANT EVENTS: 1/5 - Admit w/ SVT s/p Adenosine x2 1/9 - Bronchoscopy with RUL BAL  STUDIES:  CT Chest W/O 1/6:  Previously reviewed. Thick walled cavitary apico-posterior RUL with subpleural opacity in posterior LUL. No pathologic mediastinal adenopathy. Mild centralobular & paraseptal emphysema. Dilated main pulmonary artery. Hepatic steatosis noted.  MICROBIOLOGY: RUL BAL (1/9) AFB:  Pending Fungal:  Pending Routine:  Pending  Sputum (1/6):  Smear Negative / Ctx Pending  Quantiferon-TB (1/6):  Negative Blood Ctx x2 (1/6):  Pending  CYTOLOGY (1/9): RUL BAL:  Pending  SUBJECTIVE:  History and questioning performed through bedside interpreter. Patient underwent bronchoscopy with conscious sedation on 1/9 with right upper lobe lavage. Denies any chest pain or pressure. Denies any dyspnea or significant cough. Tolerating a diet without significant adverse effects from bronchoscopy yesterday.  REVIEW OF SYSTEMS:  No subjective fever, chills, or sweats. No nausea or vomiting.  VITAL SIGNS: Temp:  [97.8 F (36.6 C)-98.4 F (36.9 C)] 98.4 F (36.9 C) (01/10 0606) Pulse Rate:  [56-101] 56 (01/10 0606) Resp:  [9-26] 20 (01/10 0606) BP: (86-187)/(43-145) 121/43 mmHg (01/10 0606) SpO2:  [87 %-100 %] 93 % (01/10 0606) Weight:  [180 lb (81.647 kg)-180 lb 1.6 oz (81.693 kg)] 180 lb 1.6 oz (81.693 kg) (01/10 0606)  PHYSICAL EXAMINATION: General:  Awake. Alert. No acute distress. Preparing to eat lunch.  Integument:  Warm & dry. No rash on exposed skin.  HEENT:  Moist mucus membranes. No oral ulcers. No scleral injection or icterus.  Cardiovascular:  Regular rate. No edema. No appreciable JVD.  Pulmonary:  Good aeration & clear to auscultation bilaterally. Symmetric chest wall  expansion. No accessory muscle use on room air. Abdomen: Soft. Normal bowel sounds. Nondistended. Grossly nontender.    Recent Labs Lab 11/01/15 1510 11/02/15 0425 11/02/15 1048  NA 143 142 142  K 4.0 3.8 4.0  CL 113* 110 110  CO2 '22 23 24  '$ BUN '19 13 13  '$ CREATININE 1.17* 0.88 0.78  GLUCOSE 134* 116* 110*    Recent Labs Lab 11/04/15 0315 11/05/15 0505 11/06/15 0549  HGB 13.1 13.5 14.3  HCT 42.4 42.3 44.1  WBC 7.8 7.0 8.5  PLT 232 214 224   No results found.   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  Adenosine x2 with conversion to NSR.  Incidental finding of RUL cavitary lesion and L airspace disease on CXR.  This is new from last CXR in system from 2015.  Hx of close relative exposure with TB.  DDx includes bacterial / fungal, tuberculous, autoimmune and malignancy given smoking history. I suspect malignancy is the most likely cause given her negative QuantiFERON-TB test. Awaiting the results of bronchoscopy lavage smears and cytology.  1. Cavitary Lung Disease: Awaiting results from lavage. Plan for further workup as outpatient with PET/CT imaging if smears negative.  Sonia Baller Ashok Cordia, M.D. [facility] Pulmonary & Critical Care Pager:  [phone] After 3pm or if no response, call [phone] 11/06/2015, 9:18 AM

## 2015-11-06 NOTE — Progress Notes (Signed)
    Subjective: No CP or SOB  Objective: Vital signs in last 24 hours: Temp:  [97.8 F (36.6 C)-98.4 F (36.9 C)] 98.4 F (36.9 C) (01/10 0606) Pulse Rate:  [56-101] 56 (01/10 0606) Resp:  [9-26] 20 (01/10 0606) BP: (86-187)/(43-145) 121/43 mmHg (01/10 0606) SpO2:  [87 %-100 %] 93 % (01/10 0606) Weight:  [180 lb (81.647 kg)-180 lb 1.6 oz (81.693 kg)] 180 lb 1.6 oz (81.693 kg) (01/10 0606) Last BM Date: 10/28/15  Intake/Output from previous day: 01/09 0701 - 01/10 0700 In: 720 [P.O.:480; I.V.:240] Out: 1250 [Urine:1250] Intake/Output this shift:    Medications Scheduled Meds: . aspirin EC  81 mg Oral Daily  . atorvastatin  80 mg Oral q1800  . heparin subcutaneous  5,000 Units Subcutaneous 3 times per day  . metoprolol tartrate  12.5 mg Oral BID  . sodium chloride  3 mL Intravenous Q12H  . tuberculin  5 Units Intradermal Once   Continuous Infusions:  PRN Meds:.sodium chloride, acetaminophen, nitroGLYCERIN, ondansetron (ZOFRAN) IV, sodium chloride  PE: General appearance: alert, cooperative, no distress and Appears comfortable.  She was talking to her grandaughter on the phone.  Lungs: Decreased BS bilateral apices. No wheeze rhonchi or rales.  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 She does have cystic liver  disease noted. Her troponin elevation may just be from demand ischemia due to the tachycardia.   Continue low-dose metoprolol for rate control.  No further SVT. HR in the 50's  Depending on her clinical course and the outcome of her pulmonary evaluation/bronch, consider cardiac cath versus pharmacologic stress to evaluate for any large areas of ischemia.  2) Cavitary lung lesion  New from last year.  Pulmonology following.  SP Bronchoscopy yesterday.  Cultures pending.  She is on airborne precautions for TB (exposure from husband).  Quantiferon TB gold negative.  Respiratory cultures pending.    3)  HLD:    LDL 126.  On high dose statin.   LOS: 5 days    HAGER, BRYAN PA-C 11/06/2015 9:05 AM  Agree with above assessment.  Patient is in no distress, lying flat comfortably. No further SVT.  No chest pain. Cultures from bronchoscopy pending. Pulmonary following. I think her mild elevation of troponin was secondary to her SVT.  No further ischemic workup planned. I will get another EKG.

## 2015-11-07 LAB — CULTURE, BLOOD (ROUTINE X 2)
Culture: NO GROWTH
Culture: NO GROWTH

## 2015-11-07 LAB — CBC
HCT: 43.7 % (ref 36.0–46.0)
Hemoglobin: 14.3 g/dL (ref 12.0–15.0)
MCH: 29.1 pg (ref 26.0–34.0)
MCHC: 32.7 g/dL (ref 30.0–36.0)
MCV: 88.8 fL (ref 78.0–100.0)
Platelets: 240 10*3/uL (ref 150–400)
RBC: 4.92 MIL/uL (ref 3.87–5.11)
RDW: 13.7 % (ref 11.5–15.5)
WBC: 9.4 10*3/uL (ref 4.0–10.5)

## 2015-11-07 LAB — CULTURE, RESPIRATORY W GRAM STAIN: Special Requests: NORMAL

## 2015-11-07 LAB — CULTURE, RESPIRATORY
Culture: NORMAL
Gram Stain: NONE SEEN

## 2015-11-07 MED ORDER — ATORVASTATIN CALCIUM 80 MG PO TABS: 80.0000 mg | ORAL_TABLET | Freq: Every day | ORAL | Status: DC

## 2015-11-07 MED ORDER — ASPIRIN 81 MG PO TBEC: 81.0000 mg | DELAYED_RELEASE_TABLET | Freq: Every day | ORAL | Status: DC

## 2015-11-07 MED ORDER — METOPROLOL TARTRATE 25 MG PO TABS: 12.5000 mg | ORAL_TABLET | Freq: Two times a day (BID) | ORAL | Status: DC

## 2015-11-07 MED ORDER — NITROGLYCERIN 0.4 MG SL SUBL: 0.4000 mg | SUBLINGUAL_TABLET | SUBLINGUAL | Status: DC | PRN

## 2015-11-07 NOTE — Progress Notes (Signed)
Name: Brooke Mora MRN: [id number] DOB: [birth year]    ADMISSION DATE:  11/01/2015 CONSULTATION DATE:  11/02/15  REFERRING MD :  Dr. Mare Ferrari / Cardiology   CHIEF COMPLAINT:  Cavitary Lung Disease  SIGNIFICANT EVENTS: 1/5 - Admit w/ SVT s/p Adenosine x2 1/9 - Bronchoscopy with RUL BAL  STUDIES:  CT Chest W/O 1/6:  Previously reviewed. Thick walled cavitary apico-posterior RUL with subpleural opacity in posterior LUL. No pathologic mediastinal adenopathy. Mild centralobular & paraseptal emphysema. Dilated main pulmonary artery. Hepatic steatosis noted.  MICROBIOLOGY: RUL BAL (1/9) AFB:  Smear Negative / Ctx pending Fungal:  Smear Negative / Ctx pending Routine:  Normal oral flora  Sputum AFB (1/6):  Smear Negative / Ctx Pending  Quantiferon-TB (1/6):  Negative Blood Ctx x2 (1/6):  Pending  CYTOLOGY (1/9): RUL BAL:  No malignancy.  SUBJECTIVE: History obtained through [place] interpreter on phone. Patient denies any dyspnea. Mild intermittent and infrequent nonproductive cough. Denies any nausea or vomiting.  REVIEW OF SYSTEMS:  No fever or chills. No chest pain or pressure.  VITAL SIGNS: Temp:  [98 F (36.7 C)-98.6 F (37 C)] 98 F (36.7 C) (01/11 0629) Pulse Rate:  [57-69] 67 (01/11 1101) Resp:  [20] 20 (01/11 0629) BP: (113-129)/(51-60) 121/60 mmHg (01/11 1101) SpO2:  [93 %-99 %] 99 % (01/11 0629) Weight:  [178 lb 1.6 oz (80.786 kg)] 178 lb 1.6 oz (80.786 kg) (01/11 0629)  PHYSICAL EXAMINATION: General:  Awake. Resting comfortably. No distress. Integument:  Warm & dry. No rash on exposed skin.  HEENT:  Moist mucus membranes. No oral ulcers. No scleral injection.  Cardiovascular:  Regular rate. No edema. No appreciable JVD.  Pulmonary:  Clear bilaterally to auscultation. Normal work of breathing on room air. Speaking in complete sentences. Abdomen: Soft. Normal bowel sounds. Nondistended. Grossly nontender.    Recent Labs Lab 11/01/15 1510 11/02/15 0425  11/02/15 1048  NA 143 142 142  K 4.0 3.8 4.0  CL 113* 110 110  CO2 '22 23 24  '$ BUN '19 13 13  '$ CREATININE 1.17* 0.88 0.78  GLUCOSE 134* 116* 110*    Recent Labs Lab 11/05/15 0505 11/06/15 0549 11/07/15 0500  HGB 13.5 14.3 14.3  HCT 42.3 44.1 43.7  WBC 7.0 8.5 9.4  PLT 214 224 240   No results found.   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  Adenosine x2 with conversion to NSR.  Incidental finding of RUL cavitary lesion and L airspace disease on CXR.  This is new from last CXR in system from 2015.  S/P Bronchoscopy with lavage of RUL on 1/9 with negative cytology and negative AFB smear. Additionally patient's Quantiferon TB is negative making TB less likely. Malignancy is possible despite negative cytology. Plan to continue workup of her cavity with serologies & outpatient workup of her lesions with a PET CT. It's doubtful this is a lung primary but still possible if malignancy. Personally spoke with Dr. Mare Ferrari to help facilitate discharge today.  1. Cavitary Lung Disease:  AFB smear & cytology negative. Plan for outpatient PET/CT imaging. Checking ANCA & Keimani prior to discharge. 2. Follow-up:  Patient scheduled for 1/20 (Friday) at [place] at our [facility] to review culture results and labs from today further before scheduling PET [place]. Ashok Cordia, M.D. [facility] Pager:  [phone] After 3pm or if no  response, call [phone] 11/07/2015, 12:21 PM

## 2015-11-07 NOTE — Progress Notes (Signed)
Discharge orders have been placed for pt to return home.  IV has been removed.  Telemetry has been removed and CCMD notified.  Daughter and granddaughter are at bedside to assist with transportation.  Discharge instructions have been explained to granddaughter (pt has given permission for her to translate from [place] to [place]), and they have no further questions at this time.  Medication due times have been explained.  Rx prescriptions have been sent to preferred pharmacy.   As ordered, blood lab draws have been collected prior to discharge.   Pt was in no acute distress.

## 2015-11-07 NOTE — Progress Notes (Signed)
PPD placed on 11/02/2015 read with ? Raised  Area 24m.  Read on 11/05/2015.  NKarie Kirks RN,

## 2015-11-07 NOTE — Progress Notes (Signed)
Patient Name: Brooke Mora Date of Encounter: 11/07/2015     Principal Problem:   SVT (supraventricular tachycardia) (HCC) Active Problems:   Troponin level elevated   Cavitary lesion of lung    SUBJECTIVE  The patient states she feels well.  She is not having any shortness of breath.  No fever.  Pulse and blood pressure are stable on small dose of metoprolol.  No further SVT.  Not coughing up any sputum.  CURRENT MEDS . aspirin EC  81 mg Oral Daily  . atorvastatin  80 mg Oral q1800  . heparin subcutaneous  5,000 Units Subcutaneous 3 times per day  . metoprolol tartrate  12.5 mg Oral BID  . sodium chloride  3 mL Intravenous Q12H  . tuberculin  5 Units Intradermal Once    OBJECTIVE  Filed Vitals:   11/06/15 1014 11/06/15 1238 11/06/15 2000 11/07/15 0629  BP: 131/48 113/51 129/55 126/56  Pulse: 68 57 69 64  Temp:  98.6 F (37 C) 98.4 F (36.9 C) 98 F (36.7 C)  TempSrc:  Oral Oral Oral  Resp:  '20 20 20  '$ Height:      Weight:    178 lb 1.6 oz (80.786 kg)  SpO2:  93% 93% 99%    Intake/Output Summary (Last 24 hours) at 11/07/15 1020 Last data filed at 11/07/15 0900  Gross per 24 hour  Intake    600 ml  Output    600 ml  Net      0 ml   Filed Weights   11/05/15 0927 11/06/15 0606 11/07/15 0629  Weight: 180 lb (81.647 kg) 180 lb 1.6 oz (81.693 kg) 178 lb 1.6 oz (80.786 kg)    PHYSICAL EXAM  General: Pleasant, NAD.  Lying flat in no distress. Neuro: Alert and oriented X 3. Moves all extremities spontaneously. Psych: Normal affect. HEENT:  Normal  Neck: Supple without bruits or JVD. Lungs:  Resp regular and unlabored, CTA. Heart: RRR no s3, s4, or murmurs. Abdomen: Soft, non-tender, non-distended, BS + x 4.  Extremities: No clubbing, cyanosis or edema. DP/PT/Radials 2+ and equal bilaterally.  Accessory Clinical Findings  CBC  Recent Labs  11/06/15 0549 11/07/15 0500  WBC 8.5 PENDING  HGB 14.3 14.3  HCT 44.1 43.7  MCV 87.8 88.8  PLT 224 269    Basic Metabolic Panel No results for input(s): NA, K, CL, CO2, GLUCOSE, BUN, CREATININE, CALCIUM, MG, PHOS in the last 72 hours. Liver Function Tests No results for input(s): AST, ALT, ALKPHOS, BILITOT, PROT, ALBUMIN in the last 72 hours. No results for input(s): LIPASE, AMYLASE in the last 72 hours. Cardiac Enzymes No results for input(s): CKTOTAL, CKMB, CKMBINDEX, TROPONINI in the last 72 hours. BNP Invalid input(s): POCBNP D-Dimer No results for input(s): DDIMER in the last 72 hours. Hemoglobin A1C No results for input(s): HGBA1C in the last 72 hours. Fasting Lipid Panel No results for input(s): CHOL, HDL, LDLCALC, TRIG, CHOLHDL, LDLDIRECT in the last 72 hours. Thyroid Function Tests No results for input(s): TSH, T4TOTAL, T3FREE, THYROIDAB in the last 72 hours.  Invalid input(s): FREET3  TELE  Sinus bradycardia.  ECG    Radiology/Studies  Ct Chest Wo Contrast  11/02/2015  CLINICAL DATA:  Follow-up cavitary apical right upper lobe lesion. COPD. EXAM: CT CHEST WITHOUT CONTRAST TECHNIQUE: Multidetector CT imaging of the chest was performed following the standard protocol without IV contrast. COMPARISON:  11/01/2015 chest radiograph. FINDINGS: Mediastinum/Nodes: Normal heart size. No pericardial fluid/thickening. Coronary atherosclerosis. Mildly dilated main  pulmonary artery (3.1 cm diameter). Atherosclerotic nonaneurysmal thoracic aorta. Normal visualized thyroid. Normal esophagus. No axillary adenopathy. No pathologically enlarged mediastinal or gross hilar lymph nodes. There are scattered coarse calcifications within nonenlarged aortopulmonary window and left hilar lymph nodes, likely from prior granulomatous disease. Lungs/Pleura: No pneumothorax. No pleural effusion. Mild centrilobular and paraseptal emphysema. Diffuse bronchial wall thickening. There is a 6.1 x 6.1 cm irregular thick walled cavitary mass in the posterior apical right upper lobe, which abuts the posterior  pleural (series 3/image 11), which demonstrates a few mildly thickened irregular internal septations. There is irregular confluent subpleural nodular consolidation in the posterior left upper lobe measuring up to 2.6 x 2.0 cm in axial dimensions (series 3/image 18). No additional acute consolidative airspace disease or additional pulmonary nodules. Upper abdomen: Probable tiny hiatal hernia. Simple lobulated 6.0 cm liver cyst in the lateral segment liver lower lobe. Additional simple 2.3 cm left liver lobe cyst. Diffuse hepatic steatosis. Musculoskeletal: No aggressive appearing focal osseous lesions. Mild degenerative changes in the thoracic spine. IMPRESSION: 1. Irregular thick-walled 6.1 cm cavitary lung mass in the apical right upper lobe. Irregular confluent subpleural nodular consolidation in the posterior left upper lobe. Differential considerations include a cavitary infection such as due to mycobacterial or fungal disease or a cavitary right upper lobe bronchogenic carcinoma with contralateral metastatic disease. 2. No thoracic adenopathy on this noncontrast study. 3. Mild centrilobular and paraseptal emphysema and diffuse bronchial wall thickening, in keeping with known COPD. 4. Coronary atherosclerosis. 5. Dilated main pulmonary artery, suggesting pulmonary arterial hypertension. 6. Diffuse hepatic steatosis. Electronically Signed   By: Ilona Sorrel M.D.   On: 11/02/2015 17:05   Dg Chest Port 1 View  11/01/2015  CLINICAL DATA:  82 year old presenting with tachycardia, shortness of breath, fever and chills, acute onset. EXAM: PORTABLE CHEST 1 VIEW COMPARISON:  04/06/2014, 05/07/2013. FINDINGS: Cardiac silhouette upper normal in size, unchanged. Thoracic aorta tortuous and mildly atherosclerotic, unchanged. Thick walled cavity in the right lung apex without air-fluid level. Patchy airspace opacities in the left upper lobe. Lungs otherwise clear. No visible pleural effusions. IMPRESSION: 1. Thick walled  cavity involving the right lung apex without an associated air-fluid level. Differential diagnosis includes malignancy and lung abscess. 2. Patchy pneumonia involving the left upper lobe. Electronically Signed   By: Evangeline Dakin M.D.   On: 11/01/2015 15:49    ASSESSMENT AND PLAN 1.  Paroxysmal supraventricular tachycardia.  She has had no recurrence since starting on low-dose beta blocker.  Mild elevation of troponin is felt to be secondary to demand ischemia. Plan: Continue beta blocker as outpatient.  2.  Cavitary lung right lung lesion in apex with abnormality also noted in left apex.  AFB smear from bronchoscopy is negative for AFB.  Cytologies also negative.  Further workup per pulmonary  Plan: Continue current medications.  Await pulmonary further recommendations as to subsequent workup.  From cardiac standpoint she could be discharged soon.  Question also as to the ongoing necessity for respiratory isolation?  Signed, Warren Danes MD

## 2015-11-07 NOTE — Care Management Note (Addendum)
Case Management Note  Patient Details  Name: Brooke Mora MRN: [id number] Date of Birth: January 02, 1929  Subjective/Objective:        COPD, SVT              Action/Plan: NCM spoke to pt and gave permission to speak to grand-dtr, Reeves Forth # [phone]. Grand-dtr states pt lives in the home with her and pt's dtr, Gwenlyn Fudge [phone]. They are able to assist pt in the home as needed. Able to get medications. Pt ambulatory and no DME needed. Dtr called PCP's office Dr. Vista Lawman for appt on 11/12/2015 at 3 pm. Pt states she has not seen PCP in over a year. Gave permission to fax dc summary to PCP's office.   Expected Discharge Date:  11/07/2015               Expected Discharge Plan:  Home/Self Care  In-House Referral:  NA  Discharge planning Services  CM Consult  Post Acute Care Choice:  NA Choice offered to:  NA  DME Arranged:  N/A DME Agency:  NA  HH Arranged:  NA HH Agency:  NA  Status of Service:  Completed, signed off  Medicare Important Message Given:    Date Medicare IM Given:    Medicare IM give by:    Date Additional Medicare IM Given:    Additional Medicare Important Message give by:     If discussed at [place] of Stay Meetings, dates discussed:    Additional Comments:  Erenest Rasher, RN 11/07/2015, 3:46 PM

## 2015-11-07 NOTE — Discharge Summary (Signed)
CARDIOLOGY DISCHARGE SUMMARY   Patient ID: Brooke Mora MRN: [id number] DOB/AGE: [birth year] 82 y.o.  Admit date: 11/01/2015 Discharge date: 11/07/2015  PCP: Benito Mccreedy, MD Primary Cardiologist: New - Dr. Irish Lack  Primary Discharge Diagnosis: SVT (supraventricular tachycardia) ([place]) Secondary Discharge Diagnosis: Troponin level elevated, Cavitary lesion of lung   Consults: Pulmonology  Procedures: Transthoracic Echocardiogram, CT Chest, Bronchoscopy with Airway Inspection, Bronchoalveolar Lavage Right Upper Lobe  Hospital Course: Brooke Mora is a 82 y.o. female (primarly Spanish speaking) with past medical history of GERD, COPD, HTN (not on medications), HLD and kidney stones who presented to [place] on 11/01/2015 with palpitations that began after a coughing spell.   She reported having palpitations with dizziness starting earlier that morning. Upon arrival to [facility] her EKG showed SVT. Two doses of adenosine were administered which ultimately broke her rhythm. Her troponin was elevated to 0.42 and she was transferred to [facility] for further evaluation.   She was stable upon arrival and had no repeat bouts of SVT. She was started on Metoprolol 12.'5mg'$  BID and Atorvastatin '80mg'$  daily. Her initial CXR revealed a cavitary right upper lung lesion so she was placed on airborne isolation for possible TB and Pulmonology was consulted for further evaluation.  On 11/02/2015, she denied any repeat episodes of palpitations or dizziness. She did report difficulty with deep breathing. An echocardiogram was obtained which showed an EF of 60-65% with no wall motion abnormalities. Her troponin peaked at 1.23, then stated trending down to 0.74.  A Chest CT showed an irregular thick-walled 6.1 cm cavitary lung mass in the apical right upper lobe with differential considerations including a cavitary infection such as due to mycobacterial or fungal disease or a  cavitary right upper lobe bronchogenic carcinoma with contralateral metastatic disease. Pulmonology evaluated the patient and recommended a bronchoscopy on 11/05/2015. A Quantiferon was obtained and negative. Preliminary culture results showed no growth.  She underwent successful Bronchoscopy on 11/05/2015. No complications were noted during or following the procedure. Follow-up cytology and culture specimen are pending.  On 11/06/2015, she continued to do well without any repeat episodes of SVT. Due to her remaining without any chest pain or anginal symptoms, no other inpatient Cardiology workup was thought to be necessary at that time.   She remained without repeat symptoms on 11/07/2015. Pulmonology recommended outpatient PET/CT imaging along with ANCA and Lynee blood draws (to be done prior to discharge). Outpatient Pulmonology follow-up was arranged on 11/16/2015.  The patient was last examined by Dr. Mare Ferrari and deemed stable for discharge. She will remain on Metoprolol 12.'5mg'$  BID, ASA '81mg'$  daily, and Atorvastatin '80mg'$  daily. She has 2-4 week hospital Cardiology follow-up on 12/03/2015 with Cecilie Kicks, NP.   Labs:   Lab Results  Component Value Date   WBC 9.4 11/07/2015   HGB 14.3 11/07/2015   HCT 43.7 11/07/2015   MCV 88.8 11/07/2015   PLT 240 11/07/2015     Recent Labs Lab 11/02/15 0425 11/02/15 1048  NA 142 142  K 3.8 4.0  CL 110 110  CO2 23 24  BUN 13 13  CREATININE 0.88 0.78  CALCIUM 8.7* 8.9  PROT 6.2*  --   BILITOT 1.0  --   ALKPHOS 59  --   ALT 15  --   AST 28  --   GLUCOSE 116* 110*   Lipid Panel     Component Value Date/Time   CHOL 191 11/02/2015 0425   TRIG  176* 11/02/2015 0425   HDL 30* 11/02/2015 0425   CHOLHDL 6.4 11/02/2015 0425   VLDL 35 11/02/2015 0425   LDLCALC 126* 11/02/2015 0425    B NATRIURETIC PEPTIDE  Date/Time Value Ref Range Status  11/02/2015 04:25 AM 272.0* 0.0 - 100.0 pg/mL Final  11/01/2015 03:10 PM 143.4* 0.0 - 100.0 pg/mL Final       Radiology:   Ct Chest Wo Contrast: 11/02/2015  CLINICAL DATA:  Follow-up cavitary apical right upper lobe lesion. COPD. EXAM: CT CHEST WITHOUT CONTRAST TECHNIQUE: Multidetector CT imaging of the chest was performed following the standard protocol without IV contrast. COMPARISON:  11/01/2015 chest radiograph. FINDINGS: Mediastinum/Nodes: Normal heart size. No pericardial fluid/thickening. Coronary atherosclerosis. Mildly dilated main pulmonary artery (3.1 cm diameter). Atherosclerotic nonaneurysmal thoracic aorta. Normal visualized thyroid. Normal esophagus. No axillary adenopathy. No pathologically enlarged mediastinal or gross hilar lymph nodes. There are scattered coarse calcifications within nonenlarged aortopulmonary window and left hilar lymph nodes, likely from prior granulomatous disease. Lungs/Pleura: No pneumothorax. No pleural effusion. Mild centrilobular and paraseptal emphysema. Diffuse bronchial wall thickening. There is a 6.1 x 6.1 cm irregular thick walled cavitary mass in the posterior apical right upper lobe, which abuts the posterior pleural (series 3/image 11), which demonstrates a few mildly thickened irregular internal septations. There is irregular confluent subpleural nodular consolidation in the posterior left upper lobe measuring up to 2.6 x 2.0 cm in axial dimensions (series 3/image 18). No additional acute consolidative airspace disease or additional pulmonary nodules. Upper abdomen: Probable tiny hiatal hernia. Simple lobulated 6.0 cm liver cyst in the lateral segment liver lower lobe. Additional simple 2.3 cm left liver lobe cyst. Diffuse hepatic steatosis. Musculoskeletal: No aggressive appearing focal osseous lesions. Mild degenerative changes in the thoracic spine. IMPRESSION: 1. Irregular thick-walled 6.1 cm cavitary lung mass in the apical right upper lobe. Irregular confluent subpleural nodular consolidation in the posterior left upper lobe. Differential considerations include a  cavitary infection such as due to mycobacterial or fungal disease or a cavitary right upper lobe bronchogenic carcinoma with contralateral metastatic disease. 2. No thoracic adenopathy on this noncontrast study. 3. Mild centrilobular and paraseptal emphysema and diffuse bronchial wall thickening, in keeping with known COPD. 4. Coronary atherosclerosis. 5. Dilated main pulmonary artery, suggesting pulmonary arterial hypertension. 6. Diffuse hepatic steatosis. Electronically Signed   By: Ilona Sorrel M.D.   On: 11/02/2015 17:05   Dg Chest Port 1 View: 11/01/2015  CLINICAL DATA:  82 year old presenting with tachycardia, shortness of breath, fever and chills, acute onset. EXAM: PORTABLE CHEST 1 VIEW COMPARISON:  04/06/2014, 05/07/2013. FINDINGS: Cardiac silhouette upper normal in size, unchanged. Thoracic aorta tortuous and mildly atherosclerotic, unchanged. Thick walled cavity in the right lung apex without air-fluid level. Patchy airspace opacities in the left upper lobe. Lungs otherwise clear. No visible pleural effusions. IMPRESSION: 1. Thick walled cavity involving the right lung apex without an associated air-fluid level. Differential diagnosis includes malignancy and lung abscess. 2. Patchy pneumonia involving the left upper lobe. Electronically Signed   By: Evangeline Dakin M.D.   On: 11/01/2015 15:49    EKG: SVT with HR of 181, Sinus arrhythmia with diffuse ST depression and sinus rhythm with diffuse more subtle ST depression.  Echo: 11/02/2015 Study Conclusions - Left ventricle: The cavity size was normal. Systolic function was normal. The estimated ejection fraction was in the range of 60% to 65%. Wall motion was normal; there were no regional wall motion abnormalities. - Impressions: Cystic liver disease.  Impressions: - Cystic liver disease.  FOLLOW UP PLANS AND APPOINTMENTS Allergies  Allergen Reactions  . Nabumetone Itching and Swelling     Medication List    STOP taking  these medications        ibuprofen 400 MG tablet  Commonly known as:  ADVIL,MOTRIN      TAKE these medications        aspirin 81 MG EC tablet  Take 1 tablet (81 mg total) by mouth daily.     atorvastatin 80 MG tablet  Commonly known as:  LIPITOR  Take 1 tablet (80 mg total) by mouth daily at 6 PM.     metoprolol tartrate 25 MG tablet  Commonly known as:  LOPRESSOR  Take 0.5 tablets (12.5 mg total) by mouth 2 (two) times daily.     nitroGLYCERIN 0.4 MG SL tablet  Commonly known as:  NITROSTAT  Place 1 tablet (0.4 mg total) under the tongue every 5 (five) minutes x 3 doses as needed for chest pain.         Follow-up Information    Follow up with [facility], NP On 12/03/2015.   Specialties:  Cardiology, Radiology   Why:  Cardiology Hospital Follow-Up on 12/03/2015 at 10:00AM.   Contact information:   [address] [place] [address] [phone]       Follow up with Magdalen Spatz, NP On 11/16/2015.   Specialty:  Pulmonary Disease   Why:  Pulmonology Follow-Up on 11/16/2015 at 10:00AM.   Contact information:   [address] [place] [address] [phone]       BRING ALL MEDICATIONS WITH YOU TO FOLLOW UP APPOINTMENTS  Time spent with patient to include physician time: 40 minutes Signed: Erma Heritage, PA 11/07/2015, 2:49 PM Co-Sign MD

## 2015-11-08 LAB — ANCA TITERS
Atypical P-ANCA titer: <1:20 {titer}
C-ANCA: <1:20 {titer}
P-ANCA: <1:20 {titer}

## 2015-11-08 LAB — FANA STAINING PATTERNS
Homogeneous Pattern: 1:320 {titer} — ABNORMAL HIGH
Nucleolar Pattern: 1:320 {titer} — ABNORMAL HIGH

## 2015-11-08 LAB — MPO/PR-3 (ANCA) ANTIBODIES
ANCA Proteinase 3: <3.5 U/mL (ref 0.0–3.5)
Myeloperoxidase Abs: <9 U/mL (ref 0.0–9.0)

## 2015-11-08 LAB — ANTINUCLEAR ANTIBODIES, IFA: ANA Ab, IFA: POSITIVE — AB

## 2015-11-16 ENCOUNTER — Encounter: Payer: Self-pay | Admitting: Acute Care

## 2015-11-16 ENCOUNTER — Ambulatory Visit (INDEPENDENT_AMBULATORY_CARE_PROVIDER_SITE_OTHER)
Admission: RE | Admit: 2015-11-16 | Discharge: 2015-11-16 | Disposition: A | Discharge status: Home / Self Care | Payer: Medicaid Other | Source: Ambulatory Visit | Attending: Acute Care | Admitting: Acute Care

## 2015-11-16 ENCOUNTER — Ambulatory Visit (INDEPENDENT_AMBULATORY_CARE_PROVIDER_SITE_OTHER): Payer: Medicaid Other | Admitting: Acute Care

## 2015-11-16 VITALS — BP 124/64 | HR 57 | Temp 97.5°F | Ht 66.0 in | Wt 181.8 lb

## 2015-11-16 DIAGNOSIS — J984 Other disorders of lung: Secondary | ICD-10-CM

## 2015-11-16 DIAGNOSIS — Z23 Encounter for immunization: Secondary | ICD-10-CM

## 2015-11-16 DIAGNOSIS — J189 Pneumonia, unspecified organism: Secondary | ICD-10-CM

## 2015-11-16 NOTE — Progress Notes (Signed)
Subjective:    Patient ID: Brooke Mora, female    DOB: 11-04-1928, 82 y.o.   MRN: [id number]  HPI Brooke Mora is a 82 y.o.  Female Former smoker (primarly Spanish speaking) with past medical history of GERD, COPD, HTN (not on medications), HLD and kidney stones who presented to [facility] on 11/01/2015 with palpitations that began after a coughing spell. .Treatment included Two doses of adenosine which ultimately broke her rhythm. Her troponin was elevated to 0.42.  A QuantiferonHer troponin peaked at 1.23, then stated trending down to 0.74.he had no repeat bouts of SVT. She was started on Metoprolol 12.'5mg'$  BID and Atorvastatin '80mg'$  daily. Her initial CXR revealed a cavitary right upper lung lesion so she was placed on airborne isolation for possible TB and Pulmonology was consulted for further evaluation.Preliminary AFB and Fungus were negative. Blood cultures and A Quantiferon  were negative.   Significant Events/Studies:  Hospital Admission:  Admit date: 11/01/2015 Discharge date: 11/07/2015  PCP: Benito Mccreedy, MD Primary Cardiologist: New - Dr. Irish Lack  Primary Discharge Diagnosis: SVT (supraventricular tachycardia) ([place]) Secondary Discharge Diagnosis: Troponin level elevated, Cavitary lesion of lung  11/02/2015: Chest CT w/o Thick walled cavitary apico-posterior RUL with subpleural opacity in posterior LUL. No pathologic mediastinal adenopathy. Mild centralobular & paraseptal emphysema. Dilated main pulmonary artery. Hepatic steatosis noted.Differential considerations include a cavitary infection such as due to mycobacterial or fungal disease or a cavitary right upper lobe bronchogenic carcinoma with contralateral metastatic disease.   Consults: Pulmonology  Procedures: Transthoracic Echocardiogram, CT Chest, Bronchoscopy with Airway Inspection, Bronchoalveolar Lavage Right Upper Lobe, cytology negative for malignant cells.  MICROBIOLOGY: RUL BAL (1/9) AFB: Smear  Negative / Ctx pending Fungal: Smear Negative / Ctx pending Routine: Normal oral flora  Sputum AFB (1/6): Smear Negative / Ctx Pending  Quantiferon-TB (1/6): Negative Blood Ctx x2 (1/6): No Growth ( Final)  Griselda Ab, IFA: Positive Homogenous Pattern 1:320 Nucleolar Pattern: 1:320  CYTOLOGY (1/9): RUL BAL: No malignancy.   Echo 11/02/2015: EF of 60-65% with no wall motion abnormalities. Her troponin peaked at 1.23, then stated trending down to 0.74.  CXR: 11/16/15:  IMPRESSION: Stable cavitary lesion right apex. Stable pleural-based consolidation left apex. No interval improvement.  11/16/2015 Hospital Follow up: Pt. Presents to the office today with her granddaughter and  Spanish interpreter.She is alert and in no distress on room air. She denies dyspnea , cough or any chest pain. She states, through interpreter,she feels well. We reviewed all labs and culture results from the hospitalization through the interpreter, and discussed the need for a PET scan to be done prior to seeing Dr. Ashok Cordia 12/14/15.Both she and her granddaughter verbalized understanding through the interpreter that the the PET scan will help further evaluate the incidental abnormal findings. The patient also requested a flu shot today. She is afebrile and feels well, so we will vaccinate her today. Past Medical History  Diagnosis Date  . Kidney stones   . GERD (gastroesophageal reflux disease)   . High cholesterol   . COPD (chronic obstructive pulmonary disease) (HCC)     slight   . Hypertension     patient states b/p up and down not on meds    Current outpatient prescriptions:  .  aspirin EC 81 MG EC tablet, Take 1 tablet (81 mg total) by mouth daily., Disp: , Rfl:  .  atorvastatin (LIPITOR) 80 MG tablet, Take 1 tablet (80 mg total) by mouth daily at 6 PM., Disp: 30 tablet, Rfl:  6 .  HYDROcodone-acetaminophen (NORCO/VICODIN) 5-325 MG tablet, Take 1 tablet by mouth every 6 (six) hours as needed., Disp: ,  Rfl: 0 .  metoprolol tartrate (LOPRESSOR) 25 MG tablet, Take 0.5 tablets (12.5 mg total) by mouth 2 (two) times daily., Disp: 60 tablet, Rfl: 6 .  nitroGLYCERIN (NITROSTAT) 0.4 MG SL tablet, Place 1 tablet (0.4 mg total) under the tongue every 5 (five) minutes x 3 doses as needed for chest pain., Disp: 25 tablet, Rfl: 3   Allergies  Allergen Reactions  . Nabumetone Itching and Swelling    Review of Systems Constitutional:   No  weight loss, night sweats,  Fevers, chills, fatigue, or  lassitude.  HEENT:   No headaches,  Difficulty swallowing,  Tooth/dental problems, or  Sore throat,                No sneezing, itching, ear ache, nasal congestion, post nasal drip,   CV:  No chest pain,  Orthopnea, PND, swelling in lower extremities, anasarca, dizziness, palpitations, syncope.   GI  No heartburn, indigestion, abdominal pain, nausea, vomiting, diarrhea, change in bowel habits, loss of appetite, bloody stools.   Resp:+shortness of breath with climbing stairs, none at rest.  No excess mucus, no productive cough,  No non-productive cough,  No coughing up of blood.  No change in color of mucus.  No wheezing.  No chest wall deformity  Skin: no rash or lesions.  GU: no dysuria, change in color of urine, no urgency or frequency.  No flank pain, no hematuria   MS:  No joint pain or swelling.  No decreased range of motion.  No back pain.  Psych:  No change in mood or affect. No depression or anxiety.  No memory loss.        Objective:   Physical Exam  BP 124/64 mmHg  Pulse 57  Temp(Src) 97.5 F (36.4 C) (Oral)  Ht '5\' 6"'$  (1.676 m)  Wt 181 lb 12.8 oz (82.464 kg)  BMI 29.36 kg/m2  SpO2 96%  Physical Exam:  General- No distress,  A&Ox3, resting comfortably ENT: No sinus tenderness, TM clear, pale nasal mucosa, no oral exudate,no post nasal drip, no LAN Cardiac: S1, S2, regular rate and rhythm, no murmur Chest: No wheeze/ rales/ dullness; no accessory muscle use, no nasal flaring, no  sternal retractions/ Clear to auscultation bilaterally Abd.: Soft Non-tender Ext: No edema Neuro:  normal strength Skin: No rashes, warm and dry Psych: normal mood and behavior       Assessment & Plan:

## 2015-11-16 NOTE — Assessment & Plan Note (Addendum)
We discussed the results and preliminary results of the hospital bronch and work up through an interpreter. ( Spanish Speaking). Pt. Is doing well, no cough, no shortness of breath with the exception of when climbing stairs. Plan: You look great today. We have discussed your results from the work up Dr. Ashok Cordia did while you were in the hospital regarding your lungs. Some of the results were preliminary, and we will let you know the final results when you see Dr. Ashok Cordia 12/14/15. Please let us know if you have any additional questions. We will schedule a PET scan while you are here today. This is an additional test for your lungs to help [place] determine the cause of the changes we see in your lungs. Follow up with Dr. Ashok Cordia 12/14/15 for results of PET scan and final results of lab work and cultures. Please contact office for sooner follow up if symptoms do not improve or worsen or seek emergency care

## 2015-11-16 NOTE — Patient Instructions (Addendum)
You look great today. We have discussed your results from the work up Dr. Ashok Cordia did while you were in the hospital regarding your lungs. Please let us know if you have any additional questions. We will schedule a PET scan while you are here today. This is an additional test for your lungs. Continue taking your medications as instructed. Follow up with your cardiologist Feb. 6th as scheduled. Follow up with Dr. Ashok Cordia 12/14/15 for results of PET scan and final results of lab work and cultures. We will get a Chest x ray today. We will call you with the results. Please contact office for sooner follow up if symptoms do not improve or worsen or seek emergency care.

## 2015-11-16 NOTE — Assessment & Plan Note (Addendum)
  Pt. Feels better : denies fever ,chills, cough, chest pain or colorful sputum.  CXR Today to ensure resolution of Patchy pneumonia involving the left upper lobe. CXR today shows Stable cavitary lesion right apex. Stable pleural-based consolidation left apex. No interval improvement. We will give you the flu shot today. Follow up with Dr. Ashok Cordia 2/17/17as scheduled. Please contact office for sooner follow up if symptoms do not improve or worsen or seek emergency care

## 2015-11-20 ENCOUNTER — Encounter: Payer: Self-pay | Admitting: Interventional Cardiology

## 2015-11-28 ENCOUNTER — Ambulatory Visit (HOSPITAL_COMMUNITY)
Admission: RE | Admit: 2015-11-28 | Discharge: 2015-11-28 | Disposition: A | Discharge status: Home / Self Care | Payer: Medicaid Other | Source: Ambulatory Visit | Attending: Acute Care | Admitting: Acute Care

## 2015-11-28 DIAGNOSIS — I77811 Abdominal aortic ectasia: Secondary | ICD-10-CM | POA: Diagnosis not present

## 2015-11-28 DIAGNOSIS — R911 Solitary pulmonary nodule: Secondary | ICD-10-CM | POA: Insufficient documentation

## 2015-11-28 DIAGNOSIS — K5730 Diverticulosis of large intestine without perforation or abscess without bleeding: Secondary | ICD-10-CM | POA: Insufficient documentation

## 2015-11-28 DIAGNOSIS — I2510 Atherosclerotic heart disease of native coronary artery without angina pectoris: Secondary | ICD-10-CM | POA: Insufficient documentation

## 2015-11-28 DIAGNOSIS — J984 Other disorders of lung: Secondary | ICD-10-CM

## 2015-11-28 DIAGNOSIS — K7689 Other specified diseases of liver: Secondary | ICD-10-CM | POA: Diagnosis not present

## 2015-11-28 DIAGNOSIS — I517 Cardiomegaly: Secondary | ICD-10-CM | POA: Diagnosis not present

## 2015-11-28 DIAGNOSIS — K8020 Calculus of gallbladder without cholecystitis without obstruction: Secondary | ICD-10-CM | POA: Insufficient documentation

## 2015-11-28 DIAGNOSIS — K449 Diaphragmatic hernia without obstruction or gangrene: Secondary | ICD-10-CM | POA: Insufficient documentation

## 2015-11-28 DIAGNOSIS — K760 Fatty (change of) liver, not elsewhere classified: Secondary | ICD-10-CM | POA: Diagnosis not present

## 2015-11-28 DIAGNOSIS — G319 Degenerative disease of nervous system, unspecified: Secondary | ICD-10-CM | POA: Diagnosis not present

## 2015-11-28 DIAGNOSIS — K4090 Unilateral inguinal hernia, without obstruction or gangrene, not specified as recurrent: Secondary | ICD-10-CM | POA: Insufficient documentation

## 2015-11-28 LAB — GLUCOSE, CAPILLARY: Glucose-Capillary: 106 mg/dL — ABNORMAL HIGH (ref 65–99)

## 2015-11-28 MED ORDER — FLUDEOXYGLUCOSE F - 18 (FDG) INJECTION
8.9400 | Freq: Once | INTRAVENOUS | Status: AC | PRN
  Administered 2015-11-28: 8.94 via INTRAVENOUS

## 2015-11-30 LAB — FUNGUS CULTURE W SMEAR
Fungal Smear: NONE SEEN
Special Requests: NORMAL

## 2015-12-03 ENCOUNTER — Ambulatory Visit (INDEPENDENT_AMBULATORY_CARE_PROVIDER_SITE_OTHER): Payer: Medicaid Other | Admitting: Cardiology

## 2015-12-03 ENCOUNTER — Encounter: Payer: Self-pay | Admitting: Cardiology

## 2015-12-03 VITALS — BP 150/84 | HR 56 | Ht 66.0 in | Wt 181.8 lb

## 2015-12-03 DIAGNOSIS — I471 Supraventricular tachycardia: Secondary | ICD-10-CM

## 2015-12-03 DIAGNOSIS — R7989 Other specified abnormal findings of blood chemistry: Secondary | ICD-10-CM

## 2015-12-03 DIAGNOSIS — E785 Hyperlipidemia, unspecified: Secondary | ICD-10-CM | POA: Diagnosis not present

## 2015-12-03 DIAGNOSIS — R911 Solitary pulmonary nodule: Secondary | ICD-10-CM

## 2015-12-03 DIAGNOSIS — R778 Other specified abnormalities of plasma proteins: Secondary | ICD-10-CM

## 2015-12-03 DIAGNOSIS — J984 Other disorders of lung: Secondary | ICD-10-CM | POA: Diagnosis not present

## 2015-12-03 NOTE — Addendum Note (Signed)
Addended by: Loren Racer on: 12/03/2015 10:52 AM   Modules accepted: Orders

## 2015-12-03 NOTE — Progress Notes (Signed)
Cardiology Office Note   Date:  12/03/2015   ID:  Brooke Mora, DOB January 12, 1929, MRN [id number]  PCP:  Benito Mccreedy, MD  Cardiologist:  Dr. Burnadette Pop    Chief Complaint  Patient presents with  . Hospitalization Follow-up    SVT      History of Present Illness: Brooke Mora is a 82 y.o. female who presents for hospital follow up.   Admitted for SVT from 11/01/15 to 11/07/15.  Treated with 2 injections of adenosine with decrease of HR.   Also found cavitary right upper lung lesion so she was placed on airborne isolation for possible TB and Pulmonology was consulted for further evaluation.Preliminary AFB and Fungus were negative. Blood cultures and A Quantiferon were negative.  She is being followed by Pulmonary.  Recent PET scan to see pulmonary back 12/14/15.    Echocardiogram shows normal left ventricular function without regional wall motion abnormality. 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 Her troponin elevation may just be from demand ischemia due to the tachycardia. If she had a severe coronary stenosis, I would've expected to see a wall motion abnormality  Troponin was mildly elevated 1.23 thought to be demand ischemia with her tachycardia.  Continue low dose BB.   Today no chest pain and no SOB.  She is feeling well.  Past Medical History  Diagnosis Date  . Kidney stones   . GERD (gastroesophageal reflux disease)   . High cholesterol   . COPD (chronic obstructive pulmonary disease) (H[facility])     slight   . Hypertension     patient states b/p up and down not on meds    Past Surgical History  Procedure Laterality Date  . Hernia repair    . Knee surgery    . Abdominal hysterectomy    . Cataract extraction    . Incisional hernia repair N/A 07/18/2014    Procedure: REPAIR OF INACERATED INCISIONAL HERNIA WITH MESH;  Surgeon: Armandina Gemma, MD;  Location: [facility];  Service: General;  Laterality: N/A;  . Insertion of mesh N/A 07/18/2014    Procedure:  INSERTION OF MESH;  Surgeon: Armandina Gemma, MD;  Location: [facility];  Service: General;  Laterality: N/A;  . Video bronchoscopy Bilateral 11/05/2015    Procedure: VIDEO BRONCHOSCOPY WITHOUT FLUORO;  Surgeon: Javier Glazier, MD;  Location: [facility];  Service: Cardiopulmonary;  Laterality: Bilateral;     Current Outpatient Prescriptions  Medication Sig Dispense Refill  . aspirin EC 81 MG EC tablet Take 1 tablet (81 mg total) by mouth daily.    Marland Kitchen atorvastatin (LIPITOR) 80 MG tablet Take 1 tablet (80 mg total) by mouth daily at 6 PM. 30 tablet 6  . HYDROcodone-acetaminophen (NORCO/VICODIN) 5-325 MG tablet Take 1 tablet by mouth every 6 (six) hours as needed for moderate pain.   0  . metoprolol tartrate (LOPRESSOR) 25 MG tablet Take 0.5 tablets (12.5 mg total) by mouth 2 (two) times daily. 60 tablet 6  . nitroGLYCERIN (NITROSTAT) 0.4 MG SL tablet Place 1 tablet (0.4 mg total) under the tongue every 5 (five) minutes x 3 doses as needed for chest pain. 25 tablet 3   No current facility-administered medications for this visit.    Allergies:   Nabumetone    Social History:  The patient  reports that she quit smoking about 9 years ago. Her smoking use included Cigarettes. She has a 30 pack-year smoking history. She has never used smokeless tobacco. She reports that she does not drink  alcohol or use illicit drugs.   Family History:  The patient's family history is not on file.    ROS:  General:no colds or fevers, no weight changes Skin:no rashes or ulcers HEENT:no blurred vision, no congestion CV:see HPI PUL:see HPI GI:no diarrhea constipation or melena, no indigestion GU:no hematuria, no dysuria MS:no joint pain, no claudication Neuro:no syncope, no lightheadedness Endo:no diabetes, no thyroid disease  Wt Readings from Last 3 Encounters:  12/03/15 181 lb 12.8 oz (82.464 kg)  11/16/15 181 lb 12.8 oz (82.464 kg)  11/07/15 178 lb 1.6 oz (80.786 kg)     PHYSICAL EXAM: VS:  BP 150/84  mmHg  Pulse 56  Ht '5\' 6"'$  (1.676 m)  Wt 181 lb 12.8 oz (82.464 kg)  BMI 29.36 kg/m2 , BMI Body mass index is 29.36 kg/(m^2). General:Pleasant affect, NAD Skin:Warm and dry, brisk capillary refill HEENT:normocephalic, sclera clear, mucus membranes moist Neck:supple, no JVD, no bruits  Heart:S1S2 RRR without murmur, gallup, rub or click Lungs:clear without rales, rhonchi, or wheezes TDD:UKGU, non tender, + BS, do not palpate liver spleen or masses Ext:no lower ext edema, 2+ pedal pulses, 2+ radial pulses Neuro:alert and oriented, MAE, follows commands, + facial symmetry    EKG:  EKG is ordered today. The ekg ordered today demonstrates SB at 32 no acute changes from hospital, actually improved. Non specific T wave abnormality.    Recent Labs: 11/02/2015: ALT 15; B Natriuretic Peptide 272.0*; BUN 13; Creatinine, Ser 0.78; Magnesium 2.0; Potassium 4.0; Sodium 142; TSH 1.703 11/07/2015: Hemoglobin 14.3; Platelets 240    Lipid Panel    Component Value Date/Time   CHOL 191 11/02/2015 0425   TRIG 176* 11/02/2015 0425   HDL 30* 11/02/2015 0425   CHOLHDL 6.4 11/02/2015 0425   VLDL 35 11/02/2015 0425   LDLCALC 126* 11/02/2015 0425       Other studies Reviewed: Additional studies/ records that were reviewed today include: hospital notes and discharge and OV..   ASSESSMENT AND PLAN:  1.  Abnormal troponin with normal ECHO.  No further testing at this time.  Discussed with Dr. Angelena Form - if high risk surgery needed in the future would recommend [place] myoview.  Otherwise will follow.  She will see Dr. Angelena Form back in 6 months.  2. SVT no further episodes on BB, continue BB.  3. Lung Lesion per pulmonary.   4. Hyperlipidemia will recheck lipid and hepatic in 6 weeks.    Current medicines are reviewed with the patient today.  The patient Has no concerns regarding medicines.  The following changes have been made:  See above Labs/ tests ordered today include:see  above  Disposition:   FU:  see above  Signed, Isaiah Serge, NP  12/03/2015 10:19 AM    [facility] [place], [place], [place], [place] Phone: [phone]; Fax: [phone]

## 2015-12-03 NOTE — Patient Instructions (Signed)
Medication Instructions:  None  Labwork: None  Testing/Procedures: None  Follow-Up: Your physician wants you to follow-up in: 6 months with Dr. Angelena Form.  You will receive a reminder letter in the mail two months in advance. If you don't receive a letter, please call our office to schedule the follow-up appointment.   Any Other Special Instructions Will Be Listed Below (If Applicable).     If you need a refill on your cardiac medications before your next appointment, please call your pharmacy.

## 2015-12-14 ENCOUNTER — Encounter: Payer: Self-pay | Admitting: Pulmonary Disease

## 2015-12-14 ENCOUNTER — Ambulatory Visit (INDEPENDENT_AMBULATORY_CARE_PROVIDER_SITE_OTHER): Payer: Medicaid Other | Admitting: Pulmonary Disease

## 2015-12-14 ENCOUNTER — Other Ambulatory Visit: Payer: Medicaid Other

## 2015-12-14 VITALS — BP 132/64 | HR 70 | Ht 66.0 in | Wt 181.0 lb

## 2015-12-14 DIAGNOSIS — R918 Other nonspecific abnormal finding of lung field: Secondary | ICD-10-CM | POA: Diagnosis not present

## 2015-12-14 LAB — RHEUMATOID FACTOR: Rhuematoid fact SerPl-aCnc: <10 IU/mL (ref <=14)

## 2015-12-14 NOTE — Patient Instructions (Signed)
   Call my office if you have any questions.   We will schedule you for a breathing test and biopsy.   We will contact you if your lab tests show an alternative cause of your nodules other than cancer.   I will see you back in 2-4 weeks.  TESTS ORDERED: 1. CT - guided lung biopsy LUL preferred 2. Full PFT asap 3. SSA 4. SSB 5. Rheumatoid Factor 6. Anti-CCP

## 2015-12-14 NOTE — Progress Notes (Signed)
Subjective:    Patient ID: Brooke Mora, female    DOB: 1929-05-21, 82 y.o.   MRN: [id number]  C.C.:  Follow-up for Cavitary RUL Lung Opacity.  HPI  History and physical performed with the aid of an interpreter. Cavitary RUL Opacity:  Patient underwent bronchoscopy and lavage on 11/05/15 by me. PET/CT imaging shows hypermetabolism in right upper lobe nodule as well as satellite nodules on 11/28/15. Cultures from BAL 11/05/15 were negative for AFB and fungus. She reports no dyspnea, chest pain or pressure. No adenopathy in her neck, groin, or axilla. No dysphagia or odynophagia. No focal weakness, numbness, or tingling.  Review of Systems No fever, chills, or sweats. No weight loss. No nausea, emesis, or diarrhea.   Allergies  Allergen Reactions  . Nabumetone Itching and Swelling    Current Outpatient Prescriptions on File Prior to Visit  Medication Sig Dispense Refill  . nitroGLYCERIN (NITROSTAT) 0.4 MG SL tablet Place 1 tablet (0.4 mg total) under the tongue every 5 (five) minutes x 3 doses as needed for chest pain. 25 tablet 3  . atorvastatin (LIPITOR) 80 MG tablet Take 1 tablet (80 mg total) by mouth daily at 6 PM. (Patient not taking: Reported on 12/14/2015) 30 tablet 6  . metoprolol tartrate (LOPRESSOR) 25 MG tablet Take 0.5 tablets (12.5 mg total) by mouth 2 (two) times daily. (Patient not taking: Reported on 12/14/2015) 60 tablet 6   No current facility-administered medications on file prior to visit.    Past Medical History  Diagnosis Date  . Kidney stones   . GERD (gastroesophageal reflux disease)   . High cholesterol   . COPD (chronic obstructive pulmonary disease) (HCC)     slight   . Hypertension     patient states b/p up and down not on meds    Past Surgical History  Procedure Laterality Date  . Hernia repair    . Knee surgery    . Abdominal hysterectomy    . Cataract extraction    . Incisional hernia repair N/A 07/18/2014    Procedure: REPAIR OF INACERATED  INCISIONAL HERNIA WITH MESH;  Surgeon: Armandina Gemma, MD;  Location: [facility];  Service: General;  Laterality: N/A;  . Insertion of mesh N/A 07/18/2014    Procedure: INSERTION OF MESH;  Surgeon: Armandina Gemma, MD;  Location: [facility];  Service: General;  Laterality: N/A;  . Video bronchoscopy Bilateral 11/05/2015    Procedure: VIDEO BRONCHOSCOPY WITHOUT FLUORO;  Surgeon: Javier Glazier, MD;  Location: [place];  Service: Cardiopulmonary;  Laterality: Bilateral;    No family history on file.  Social History   Social History  . Marital Status: Widowed    Spouse Name: N/A  . Number of Children: N/A  . Years of Education: N/A   Social History Main Topics  . Smoking status: Former Smoker -- 0.50 packs/day for 60 years    Types: Cigarettes    Quit date: 10/27/2006  . Smokeless tobacco: Never Used  . Alcohol Use: No  . Drug Use: No  . Sexual Activity: Not Asked   Other Topics Concern  . None   Social History Narrative      Objective:   Physical Exam BP 132/64 mmHg  Pulse 70  Ht '5\' 6"'$  (1.676 m)  Wt 181 lb (82.101 kg)  BMI 29.23 kg/m2  SpO2 96% General:  Awake. Alert. No acute distress.   Integument:  Warm & dry. No rash on exposed skin. No bruising. HEENT:  Moist mucus membranes.  No oral ulcers. No scleral injection or icterus. . Cardiovascular:  Regular rate. No edema. No appreciable JVD.  Pulmonary:  Good aeration & clear to auscultation bilaterally. Symmetric chest wall expansion. No accessory muscle use. Abdomen: Soft. Normal bowel sounds. Nondistended. Grossly nontender. Musculoskeletal:  Normal bulk and tone. Hand grip strength 5/5 bilaterally. No joint deformity or effusion appreciated.  IMAGING CT Chest W/O 11/02/15 (previously reviewed):  Thick walled cavitary apico-posterior RUL with subpleural opacity in posterior LUL. No pathologic mediastinal adenopathy. Mild centralobular & paraseptal emphysema. Dilated main pulmonary artery. Hepatic steatosis noted. PET CT 11/28/15  (personally reviewed by me with the patient today): Cavitary right upper lobe mass measuring 5.9 x 6.8 cm with max SUV 6.0. Posterior left upper lobe nodularity including 2.6 cm component with max SUV 5.9. No other suspicious areas of hypermetabolism. Infrarenal abdominal aortic aneurysm 2.8 cm. Colonic diverticulosis noted. Fat-containing right inguinal hernia.  PATHOLOGY RUL BAL (11/05/15): No malignancy.  MICROBIOLOGY: RUL BAL (1/9) AFB: Smear Negative / Ctx negative Fungal: Smear Negative / Ctx negative Routine: Normal oral flora  Sputum AFB (11/02/15): Smear Negative / Ctx negative Quantiferon-TB (11/02/15): Negative Blood Ctx x2 (11/02/15): Negative    LABS 11/07/15 [place]: 1:320 (positive) C-ANCA:  <1:20 P-ANCA:  <1:20 Atypical ANCA:  <1:20 PR-3: <3.5 MPO:  <9    Assessment & Plan:  37 -year-old female with bilateral upper lobe nodules with right upper lobe being predominantly cavitary. Patient underwent bronchoscopy with lavage by myself on 11/05/15 with negative fungal, AFB, and bacterial cultures. Serum workup is largely been negative except for a positive Maylani. Certainly this could represent an autoimmune etiology to the nodules with cavitation but malignancy is of paramount concern. I reviewed the imaging with the patient today. We will attempt to ascertain whether or not this is truly malignancy.  1. Bilateral Upper Lobe Nodules: Checking serum SSA, SSB, rheumatoid factor, and anti-CCP. Checking for pulmonary function testing as soon as possible. Ordering CT-guided lung biopsy with preference to biopsy left upper lobe nodule as it is more dense and not cavitary. 2. Follow-up: Patient to return to clinic in 2-4 weeks.

## 2015-12-17 LAB — SJOGRENS SYNDROME-A EXTRACTABLE NUCLEAR ANTIBODY: SSA (Ro) (ENA) Antibody, IgG: <1

## 2015-12-17 LAB — CYCLIC CITRUL PEPTIDE ANTIBODY, IGG: Cyclic Citrullin Peptide Ab: <16 Units

## 2015-12-17 LAB — SJOGRENS SYNDROME-B EXTRACTABLE NUCLEAR ANTIBODY: SSB (La) (ENA) Antibody, IgG: <1

## 2015-12-18 ENCOUNTER — Encounter: Payer: Self-pay | Admitting: Internal Medicine

## 2015-12-18 ENCOUNTER — Telehealth: Payer: Self-pay | Admitting: Internal Medicine

## 2015-12-18 LAB — AFB CULTURE WITH SMEAR (NOT AT ARMC)
Acid Fast Smear: NONE SEEN
Special Requests: NORMAL

## 2015-12-18 NOTE — Telephone Encounter (Signed)
I received a phone call from [facility] this morning. Ms. Echeverria was hospitalized in January. CT scan showed bilateral pulmonary nodules with a large right upper lobe cavity. Sputum culture is now growing Mycobacterium avium. We were not consulted when she was in the hospital but I will arrange her to be seen in our clinic as soon as possible.

## 2015-12-19 ENCOUNTER — Other Ambulatory Visit: Payer: Self-pay | Admitting: Radiology

## 2015-12-20 ENCOUNTER — Ambulatory Visit (HOSPITAL_COMMUNITY)
Admission: RE | Admit: 2015-12-20 | Discharge: 2015-12-20 | Disposition: A | Discharge status: Home / Self Care | Payer: Medicaid Other | Source: Ambulatory Visit | Attending: Pulmonary Disease | Admitting: Pulmonary Disease

## 2015-12-20 DIAGNOSIS — R918 Other nonspecific abnormal finding of lung field: Secondary | ICD-10-CM

## 2015-12-20 NOTE — Progress Notes (Signed)
Pt scheduled for CT lung biopsy Chart reviewed. New information from Lab/ID showing (+)growth of Mycobacterium Avium from previous AFB sputum culture. Pt has appointment next week with Dr. [name] Dr. Ashok Cordia not available to discuss.  In light of these findings, will not proceed with CT guided lung mass biopsy. Advised pt and daughter of this information and gave them reminder of appt with Dr. [name].  Ascencion Dike PA-C Interventional Radiology 12/20/2015 8:38 AM'

## 2015-12-21 ENCOUNTER — Ambulatory Visit (HOSPITAL_COMMUNITY)
Admission: RE | Admit: 2015-12-21 | Discharge: 2015-12-21 | Disposition: A | Discharge status: Home / Self Care | Payer: Medicaid Other | Source: Ambulatory Visit | Attending: Pulmonary Disease | Admitting: Pulmonary Disease

## 2015-12-21 DIAGNOSIS — R918 Other nonspecific abnormal finding of lung field: Secondary | ICD-10-CM | POA: Diagnosis present

## 2015-12-21 LAB — PULMONARY FUNCTION TEST
FEF 25-75 Post: 1.92 L/sec
FEF 25-75 Pre: 1.63 L/sec
FEF2575-%Change-Post: 17 %
FEF2575-%Pred-Post: 154 %
FEF2575-%Pred-Pre: 131 %
FEV1-%Change-Post: 4 %
FEV1-%Pred-Post: 98 %
FEV1-%Pred-Pre: 94 %
FEV1-Post: 1.86 L
FEV1-Pre: 1.78 L
FEV1FVC-%Change-Post: 3 %
FEV1FVC-%Pred-Pre: 105 %
FEV6-%Change-Post: 1 %
FEV6-%Pred-Post: 98 %
FEV6-%Pred-Pre: 96 %
FEV6-Post: 2.34 L
FEV6-Pre: 2.31 L
FEV6FVC-%Change-Post: 0 %
FEV6FVC-%Pred-Post: 106 %
FEV6FVC-%Pred-Pre: 106 %
FVC-%Change-Post: 1 %
FVC-%Pred-Post: 92 %
FVC-%Pred-Pre: 91 %
FVC-Post: 2.34 L
FVC-Pre: 2.31 L
Post FEV1/FVC ratio: 79 %
Post FEV6/FVC ratio: 100 %
Pre FEV1/FVC ratio: 77 %
Pre FEV6/FVC Ratio: 100 %
RV % pred: 329 %
RV: 8.34 L
TLC % pred: 204 %
TLC: 10.62 L

## 2015-12-21 MED ORDER — ALBUTEROL SULFATE (2.5 MG/3ML) 0.083% IN NEBU
2.5000 mg | INHALATION_SOLUTION | Freq: Once | RESPIRATORY_TRACT | Status: AC
  Administered 2015-12-21: 2.5 mg via RESPIRATORY_TRACT

## 2015-12-24 ENCOUNTER — Encounter: Payer: Self-pay | Admitting: Internal Medicine

## 2015-12-24 DIAGNOSIS — A310 Pulmonary mycobacterial infection: Secondary | ICD-10-CM | POA: Insufficient documentation

## 2015-12-24 DIAGNOSIS — N200 Calculus of kidney: Secondary | ICD-10-CM | POA: Insufficient documentation

## 2015-12-24 DIAGNOSIS — E785 Hyperlipidemia, unspecified: Secondary | ICD-10-CM | POA: Insufficient documentation

## 2015-12-24 DIAGNOSIS — K219 Gastro-esophageal reflux disease without esophagitis: Secondary | ICD-10-CM | POA: Insufficient documentation

## 2015-12-24 DIAGNOSIS — I10 Essential (primary) hypertension: Secondary | ICD-10-CM | POA: Insufficient documentation

## 2015-12-24 DIAGNOSIS — R739 Hyperglycemia, unspecified: Secondary | ICD-10-CM | POA: Insufficient documentation

## 2015-12-24 DIAGNOSIS — J441 Chronic obstructive pulmonary disease with (acute) exacerbation: Secondary | ICD-10-CM | POA: Insufficient documentation

## 2015-12-24 DIAGNOSIS — Z87891 Personal history of nicotine dependence: Secondary | ICD-10-CM | POA: Insufficient documentation

## 2015-12-25 ENCOUNTER — Ambulatory Visit (INDEPENDENT_AMBULATORY_CARE_PROVIDER_SITE_OTHER): Payer: Medicaid Other | Admitting: Pulmonary Disease

## 2015-12-25 ENCOUNTER — Ambulatory Visit
Admission: RE | Admit: 2015-12-25 | Discharge: 2015-12-25 | Disposition: A | Discharge status: Home / Self Care | Payer: Medicaid Other | Source: Ambulatory Visit | Attending: Internal Medicine | Admitting: Internal Medicine

## 2015-12-25 ENCOUNTER — Encounter: Payer: Self-pay | Admitting: Pulmonary Disease

## 2015-12-25 ENCOUNTER — Ambulatory Visit (INDEPENDENT_AMBULATORY_CARE_PROVIDER_SITE_OTHER): Payer: Medicaid Other | Admitting: Internal Medicine

## 2015-12-25 ENCOUNTER — Encounter: Payer: Self-pay | Admitting: Internal Medicine

## 2015-12-25 VITALS — BP 139/71 | HR 69 | Temp 98.0°F | Ht 66.0 in | Wt 183.5 lb

## 2015-12-25 VITALS — BP 124/62 | HR 75 | Ht 66.0 in | Wt 184.6 lb

## 2015-12-25 DIAGNOSIS — A319 Mycobacterial infection, unspecified: Secondary | ICD-10-CM

## 2015-12-25 DIAGNOSIS — K219 Gastro-esophageal reflux disease without esophagitis: Secondary | ICD-10-CM

## 2015-12-25 DIAGNOSIS — J984 Other disorders of lung: Secondary | ICD-10-CM

## 2015-12-25 DIAGNOSIS — Z87891 Personal history of nicotine dependence: Secondary | ICD-10-CM | POA: Diagnosis not present

## 2015-12-25 DIAGNOSIS — R911 Solitary pulmonary nodule: Secondary | ICD-10-CM | POA: Diagnosis not present

## 2015-12-25 DIAGNOSIS — E785 Hyperlipidemia, unspecified: Secondary | ICD-10-CM

## 2015-12-25 DIAGNOSIS — I10 Essential (primary) hypertension: Secondary | ICD-10-CM | POA: Diagnosis not present

## 2015-12-25 DIAGNOSIS — A310 Pulmonary mycobacterial infection: Secondary | ICD-10-CM

## 2015-12-25 DIAGNOSIS — N200 Calculus of kidney: Secondary | ICD-10-CM

## 2015-12-25 DIAGNOSIS — IMO0001 Reserved for inherently not codable concepts without codable children: Secondary | ICD-10-CM

## 2015-12-25 DIAGNOSIS — R739 Hyperglycemia, unspecified: Secondary | ICD-10-CM

## 2015-12-25 DIAGNOSIS — J441 Chronic obstructive pulmonary disease with (acute) exacerbation: Secondary | ICD-10-CM

## 2015-12-25 LAB — AFB CULTURE WITH SMEAR (NOT AT ARMC): Acid Fast Smear: NONE SEEN

## 2015-12-25 MED ORDER — AZITHROMYCIN 250 MG PO TABS: 250.0000 mg | ORAL_TABLET | Freq: Every day | ORAL | Status: DC

## 2015-12-25 MED ORDER — RIFAMPIN 300 MG PO CAPS: 600.0000 mg | ORAL_CAPSULE | Freq: Every day | ORAL | Status: DC

## 2015-12-25 MED ORDER — ETHAMBUTOL HCL 400 MG PO TABS: 1200.0000 mg | ORAL_TABLET | Freq: Every day | ORAL | Status: DC

## 2015-12-25 NOTE — Progress Notes (Signed)
[facility] for Infectious Disease  Reason for Consult: Mycobacterium avium pneumonia Referring Physician: Dr. Tera Partridge  Patient Active Problem List   Diagnosis Date Noted  . 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 Cavitary lesion of lung 11/01/2015    Priority: High  . HTN (hypertension) 12/24/2015  . GERD (gastroesophageal reflux disease) 12/24/2015  . Nephrolithiasis 12/24/2015  . COPD exacerbation ([place]) 12/24/2015  . Former cigarette smoker 12/24/2015  . Hyperglycemia 12/24/2015  . Dyslipidemia 12/24/2015  . SVT (supraventricular tachycardia) ([place]) 11/01/2015  . Incisional hernia, without obstruction or gangrene, incarcerated 06/12/2014    Patient's Medications  New Prescriptions   AZITHROMYCIN (ZITHROMAX) 250 MG TABLET    Take 1 tablet (250 mg total) by mouth daily.   ETHAMBUTOL (MYAMBUTOL) 400 MG TABLET    Take 3 tablets (1,200 mg total) by mouth daily.   RIFAMPIN (RIFADIN) 300 MG CAPSULE    Take 2 capsules (600 mg total) by mouth daily.  Previous Medications   ATORVASTATIN (LIPITOR) 80 MG TABLET    Take 1 tablet (80 mg total) by mouth daily at 6 PM.   METOPROLOL TARTRATE (LOPRESSOR) 25 MG TABLET    Take 0.5 tablets (12.5 mg total) by mouth 2 (two) times daily.   NITROGLYCERIN (NITROSTAT) 0.4 MG SL TABLET    Place 1 tablet (0.4 mg total) under the tongue every 5 (five) minutes x 3 doses as needed for chest pain.  Modified Medications   No medications on file  Discontinued Medications   No medications on file    Recommendations: 1. Repeat chest x-ray 2. Start 3 drug therapy with azithromycin 250 mg daily, rifampin 600 mg daily and ethambutol 1200 mg daily 3. Follow-up in one month   Assessment: I suspect that she has smoldering Mycobacterium avium pneumonia causing her recent infiltrates and large right upper lobe cavity. Even though she is feeling back to her baseline I suggested starting her on 3  drug therapy. I talked to her and her granddaughter about potential side effects of her antibiotics and ask them to call me if she has any problems tolerating them. I will see her back in one month.   HPI: Brooke Mora is a 82 y.o. female who is a former smoker. In early January she developed a dry cough. She then developed palpitations leading to admission. She was noted to have SVT. A routine chest x-ray revealed some left-sided infiltrate and a large right upper lobe cavity that had not been present on a chest x-ray in June 2015. Sputum was collected on 11/02/2015 which has grown Mycobacterium avium. She underwent bronchoscopy and specimens obtained at that time were AFB stain and culture negative. She has been feeling back to normal since discharge. She only rarely has a little cough. She has some shortness of breath when climbing stairs but this is chronic and stable. She has not had any fever, chills or sweats. Her appetite is normal and she has actually gained a few pounds recently. She recalls having pneumonia when she was about 82 years old. She is a former cigarette smoker. Her QuantiFERON gold TB assay was negative. She underwent a PET scan recently and there was no abnormal hypermetabolism noted. She is here today with her granddaughter and an interpreter.  Review of Systems: Review of Systems  Constitutional: Negative for fever, chills, weight loss, malaise/fatigue and diaphoresis.  HENT: Positive for hearing loss. Negative for sore throat.  Respiratory: Positive for cough and shortness of breath. Negative for hemoptysis, sputum production and wheezing.   Cardiovascular: Positive for palpitations. Negative for chest pain.  Gastrointestinal: Negative for nausea, vomiting, abdominal pain and diarrhea.  Genitourinary: Negative for dysuria.  Musculoskeletal: Negative for myalgias and joint pain.  Skin: Negative for rash.  Neurological: Negative for dizziness and headaches.      Past  Medical History  Diagnosis Date  . Kidney stones   . GERD (gastroesophageal reflux disease)   . High cholesterol   . COPD (chronic obstructive pulmonary disease) (HCC)     slight   . Hypertension     patient states b/p up and down not on meds    Social History  Substance Use Topics  . Smoking status: Former Smoker -- 0.50 packs/day for 60 years    Types: Cigarettes    Quit date: 10/27/2006  . Smokeless tobacco: Never Used  . Alcohol Use: No    History reviewed. No pertinent family history. Allergies  Allergen Reactions  . Nabumetone Itching and Swelling    OBJECTIVE: Filed Vitals:   12/25/15 1008  BP: 139/71  Pulse: 69  Temp: 98 F (36.7 C)  TempSrc: Oral  Height: '5\' 6"'$  (1.676 m)  Weight: 183 lb 8 oz (83.235 kg)   Body mass index is 29.63 kg/(m^2).   Physical Exam  Constitutional: She is oriented to person, place, and time.  She is non-English-speaking. She is in no distress.  HENT:  Mouth/Throat: No oropharyngeal exudate.  Eyes: Conjunctivae are normal.  Cardiovascular: Normal rate and regular rhythm.   No murmur heard. Pulmonary/Chest: Breath sounds normal. She has no wheezes. She has no rales.  Diminished breath sounds in right upper lung.  Abdominal: Soft. There is no tenderness.  Musculoskeletal: Normal range of motion.  Neurological: She is alert and oriented to person, place, and time. Gait normal.  Skin: No rash noted.  Psychiatric: Mood and affect normal.    Microbiology: No results found for this or any previous visit (from the past 240 hour(s)).  Michel Bickers, MD [facility] [phone] pager   [phone] cell 12/25/2015, 10:40 AM

## 2015-12-25 NOTE — Progress Notes (Signed)
Subjective:    Patient ID: Brooke Mora, female    DOB: 09/28/1929, 82 y.o.   MRN: [id number]  [facility].:  Follow-up for Cavitary RUL Lung Opacity & LUL Nodular Opacity.  HPI  History and physical performed with the aid of her granddaughter.  Cavitary RUL Opacity & Nodular LUL Opacity:  Sputum culture resulted with Mycobacterium avium intracellulare last week. Biopsy was subsequently discontinued and patient had evaluation by infectious diseases today. They have started the patient on triple drug therapy. Patient denies any cough. No fever, chills, or sweats.  Review of Systems No chest pain or pressure. No nausea, emesis, or abdominal pain. No headaches or focal weakness.   Allergies  Allergen Reactions  . Nabumetone Itching and Swelling    Current Outpatient Prescriptions on File Prior to Visit  Medication Sig Dispense Refill  . atorvastatin (LIPITOR) 80 MG tablet Take 1 tablet (80 mg total) by mouth daily at 6 PM. 30 tablet 6  . azithromycin (ZITHROMAX) 250 MG tablet Take 1 tablet (250 mg total) by mouth daily. 30 tablet 11  . ethambutol (MYAMBUTOL) 400 MG tablet Take 3 tablets (1,200 mg total) by mouth daily. 90 tablet 11  . metoprolol tartrate (LOPRESSOR) 25 MG tablet Take 0.5 tablets (12.5 mg total) by mouth 2 (two) times daily. 60 tablet 6  . nitroGLYCERIN (NITROSTAT) 0.4 MG SL tablet Place 1 tablet (0.4 mg total) under the tongue every 5 (five) minutes x 3 doses as needed for chest pain. 25 tablet 3  . rifampin (RIFADIN) 300 MG capsule Take 2 capsules (600 mg total) by mouth daily. 60 capsule 11   No current facility-administered medications on file prior to visit.    Past Medical History  Diagnosis Date  . Kidney stones   . GERD (gastroesophageal reflux disease)   . High cholesterol   . COPD (chronic obstructive pulmonary disease) (HCC)     slight   . Hypertension     patient states b/p up and down not on meds    Past Surgical History  Procedure Laterality Date  .  Hernia repair    . Knee surgery    . Abdominal hysterectomy    . Cataract extraction    . Incisional hernia repair N/A 07/18/2014    Procedure: REPAIR OF INACERATED INCISIONAL HERNIA WITH MESH;  Surgeon: Armandina Gemma, MD;  Location: [facility];  Service: General;  Laterality: N/A;  . Insertion of mesh N/A 07/18/2014    Procedure: INSERTION OF MESH;  Surgeon: Armandina Gemma, MD;  Location: [facility];  Service: General;  Laterality: N/A;  . Video bronchoscopy Bilateral 11/05/2015    Procedure: VIDEO BRONCHOSCOPY WITHOUT FLUORO;  Surgeon: Javier Glazier, MD;  Location: [place];  Service: Cardiopulmonary;  Laterality: Bilateral;    No family history on file.  Social History   Social History  . Marital Status: Widowed    Spouse Name: N/A  . Number of Children: N/A  . Years of Education: N/A   Social History Main Topics  . Smoking status: Former Smoker -- 0.50 packs/day for 60 years    Types: Cigarettes    Quit date: 10/27/2006  . Smokeless tobacco: Never Used  . Alcohol Use: No  . Drug Use: No  . Sexual Activity: Not Asked   Other Topics Concern  . None   Social History Narrative      Objective:   Physical Exam BP 124/62 mmHg  Pulse 75  Ht '5\' 6"'$  (1.676 m)  Wt 184 lb  9.6 oz (83.734 kg)  BMI 29.81 kg/m2  SpO2 95% General:  Awake. Alert. No acute distress.  Accompanied by granddaughter today. Integument:  Warm & dry. No bruising. HEENT:  Moist mucus membranes. No oral ulcers. No scleral injection. Cardiovascular:  Regular rate. No edema. Normal S1 & S2.  Pulmonary:  Clear bilaterally to auscultation. Symmetric chest wall expansion. Normal work of breathing on room air. Abdomen: Soft. Normal bowel sounds. Nontender. Musculoskeletal:  Normal bulk and tone. No joint deformity or effusion appreciated.  PFT 12/21/15:  FVC 2.31 L (91%) FEV1 1.78 L (94%) FEV1/FVC 0.77 FEF 25-75 1.63 L (131%) no bronchodilator response TLC 10.16 L (204%) RV 329% ERV 131% (patient unable to perform  DLCO)  IMAGING CT Chest W/O 11/02/15 (previously reviewed):  Thick walled cavitary apico-posterior RUL with subpleural opacity in posterior LUL. No pathologic mediastinal adenopathy. Mild centralobular & paraseptal emphysema. Dilated main pulmonary artery. Hepatic steatosis noted. PET CT 11/28/15 (personally reviewed by me with the patient today): Cavitary right upper lobe mass measuring 5.9 x 6.8 cm with max SUV 6.0. Posterior left upper lobe nodularity including 2.6 cm component with max SUV 5.9. No other suspicious areas of hypermetabolism. Infrarenal abdominal aortic aneurysm 2.8 cm. Colonic diverticulosis noted. Fat-containing right inguinal hernia.  PATHOLOGY RUL BAL (11/05/15): No malignancy.  MICROBIOLOGY: RUL BAL (1/9) AFB: Smear Negative / Ctx negative Fungal: Smear Negative / Ctx negative Routine: Normal oral flora  Sputum AFB (11/02/15): Smear Negative / MAC Quantiferon-TB (11/02/15): Negative Blood Ctx x2 (11/02/15): Negative    LABS 12/14/15 RF:  <10 Anti-CCP:  <16 SSA:  <1 SSB:  <1  11/07/15 [place]: 1:320 (positive) C-ANCA:  <1:20 P-ANCA:  <1:20 Atypical ANCA:  <1:20 PR-3: <3.5 MPO:  <9    Assessment & Plan:  64 -year-old female with bilateral upper lobe nodules with right upper lobe being predominantly cavitary. Patient's sputum culture resulted with nontuberculous mycobacteria (Mycobacterium avium intracellulare). She has been started on triple drug therapy by Dr. [name]. I am still worried about the potential for malignancy as the patient's bronchoscopy and lavage was negative while the sputum culture was positive. I feel that close follow-up imaging is reasonable and if there are any further signs of progression I would consider repeat bronchoscopy and culture versus biopsy at that time. I instructed the patient to contact my office if she develops any new infectious symptoms or breathing problems before her next appointment. Dr. [name] has already reviewed the  potential side effects from her antibiotic regimen.  1. Bilateral Upper Lobe Nodules: Currently on treatment for nontuberculous mycobacteria. Plan for repeat CT scan of the chest without contrast in April 2017. Further bronchoscopy and/or biopsy depending upon this result. 2. Nontuberculous mycobacterial infection: Following with Dr. [name]. Currently on triple agent therapy. 3. Follow-up: Return to clinic after her CT scan is complete in April.  Sonia Baller Ashok Cordia, M.D. [facility] Pager:  [phone] After 3pm or if no response, call [phone] 4:20 PM 12/25/2015

## 2015-12-25 NOTE — Patient Instructions (Signed)
   Please call me if you have any new breathing problems or chest discomfort before your next appointment.  Please call me if you develop any productive cough, fever, chills, or sweats before your next appointment.  I will see you back after your CT scan of the chest in April.  TESTS ORDERED: 1. CT chest without contrast week 02/11/16

## 2015-12-25 NOTE — Assessment & Plan Note (Signed)
I suspect that she has smoldering Mycobacterium avium pneumonia causing her recent infiltrates and large right upper lobe cavity. Even though she is feeling back to her baseline I suggested starting her on 3 drug therapy. I talked to her and her granddaughter about potential side effects of her antibiotics and ask them to call me if she has any problems tolerating them. I will see her back in one month.

## 2016-01-04 ENCOUNTER — Other Ambulatory Visit (INDEPENDENT_AMBULATORY_CARE_PROVIDER_SITE_OTHER): Payer: Medicaid Other | Admitting: *Deleted

## 2016-01-04 DIAGNOSIS — E785 Hyperlipidemia, unspecified: Secondary | ICD-10-CM | POA: Diagnosis not present

## 2016-01-04 LAB — HEPATIC FUNCTION PANEL
ALT: 10 U/L (ref 6–29)
AST: 12 U/L (ref 10–35)
Albumin: 3.9 g/dL (ref 3.6–5.1)
Alkaline Phosphatase: 80 U/L (ref 33–130)
Bilirubin, Direct: 0.1 mg/dL (ref <=0.2)
Indirect Bilirubin: 0.3 mg/dL (ref 0.2–1.2)
Total Bilirubin: 0.4 mg/dL (ref 0.2–1.2)
Total Protein: 6.8 g/dL (ref 6.1–8.1)

## 2016-01-04 LAB — LIPID PANEL
Cholesterol: 247 mg/dL — ABNORMAL HIGH (ref 125–200)
HDL: 42 mg/dL — ABNORMAL LOW (ref >=46)
LDL Cholesterol: 173 mg/dL — ABNORMAL HIGH (ref <130)
Total CHOL/HDL Ratio: 5.9 Ratio — ABNORMAL HIGH (ref <=5.0)
Triglycerides: 158 mg/dL — ABNORMAL HIGH (ref <150)
VLDL: 32 mg/dL — ABNORMAL HIGH (ref <30)

## 2016-01-04 NOTE — Addendum Note (Signed)
Addended by: Eulis Foster on: 01/04/2016 10:29 AM   Modules accepted: Orders

## 2016-01-07 ENCOUNTER — Encounter: Payer: Self-pay | Admitting: Internal Medicine

## 2016-01-23 ENCOUNTER — Ambulatory Visit: Payer: Medicaid Other | Admitting: Internal Medicine

## 2016-01-30 ENCOUNTER — Encounter: Payer: Self-pay | Admitting: Internal Medicine

## 2016-01-30 ENCOUNTER — Ambulatory Visit (INDEPENDENT_AMBULATORY_CARE_PROVIDER_SITE_OTHER): Payer: Medicaid Other | Admitting: Internal Medicine

## 2016-01-30 VITALS — BP 95/58 | HR 66 | Temp 98.3°F | Wt 182.0 lb

## 2016-01-30 DIAGNOSIS — A310 Pulmonary mycobacterial infection: Secondary | ICD-10-CM | POA: Diagnosis not present

## 2016-01-30 MED ORDER — ASPIRIN 81 MG PO TBEC: 81.0000 mg | DELAYED_RELEASE_TABLET | Freq: Every day | ORAL | Status: DC

## 2016-01-30 NOTE — Assessment & Plan Note (Signed)
I have instructed her to go back to her pharmacy and obtain refills of her azithromycin, ethambutol and rifampin. I told her again that she will probably need to be on therapy for at least one year. She will follow-up in 2 months.

## 2016-01-30 NOTE — Progress Notes (Signed)
[facility] for [facility]ious [facility]  Patient Active Problem List   Diagnosis Date Noted  . 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 Cavitary lesion of lung 11/01/2015    Priority: High  . HTN (hypertension) 12/24/2015  . GERD (gastroesophageal reflux disease) 12/24/2015  . Nephrolithiasis 12/24/2015  . COPD exacerbation ([place]) 12/24/2015  . Former cigarette smoker 12/24/2015  . Hyperglycemia 12/24/2015  . Dyslipidemia 12/24/2015  . SVT (supraventricular tachycardia) ([place]) 11/01/2015  . Incisional hernia, without obstruction or gangrene, incarcerated 06/12/2014    Patient's Medications  New Prescriptions   No medications on file  Previous Medications   ATORVASTATIN (LIPITOR) 80 MG TABLET    Take 1 tablet (80 mg total) by mouth daily at 6 PM.   AZITHROMYCIN (ZITHROMAX) 250 MG TABLET    Take 1 tablet (250 mg total) by mouth daily.   ETHAMBUTOL (MYAMBUTOL) 400 MG TABLET    Take 3 tablets (1,200 mg total) by mouth daily.   METOPROLOL TARTRATE (LOPRESSOR) 25 MG TABLET    Take 0.5 tablets (12.5 mg total) by mouth 2 (two) times daily.   NITROGLYCERIN (NITROSTAT) 0.4 MG SL TABLET    Place 1 tablet (0.4 mg total) under the tongue every 5 (five) minutes x 3 doses as needed for chest pain.   RIFAMPIN (RIFADIN) 300 MG CAPSULE    Take 2 capsules (600 mg total) by mouth daily.  Modified Medications   Modified Medication Previous Medication   ASPIRIN 81 MG EC TABLET aspirin EC 81 MG EC tablet      Take 1 tablet (81 mg total) by mouth daily.    Take 1 tablet (81 mg total) by mouth daily.  Discontinued Medications   No medications on file    Subjective:  Brooke Mora is in for her routine follow-up visit. She is accompanied by her daughter. She took azithromycin, ethambutol and rifampin for one month and then stopped because she did not realize she was supposed to stay on it and had refills. She tolerated the medication well with the  exception of some very mild intermittent nausea that would occur about once a week. She's not had any fever, chills or sweats. She has had minimal cough and rarely produces any sputum.  Review of Systems: Review of Systems  Constitutional: Negative for fever, chills, weight loss, malaise/fatigue and diaphoresis.  HENT: Negative for sore throat.   Respiratory: Negative for cough, sputum production and shortness of breath.         As noted in history of present illness  Cardiovascular: Negative for chest pain.  Gastrointestinal: Positive for nausea. Negative for heartburn, vomiting, abdominal pain and diarrhea.  Genitourinary: Negative for dysuria.  Musculoskeletal: Negative for myalgias and joint pain.  Skin: Negative for rash.  Neurological: Negative for dizziness and headaches.    Past Medical History  Diagnosis Date  . Kidney stones   . GERD (gastroesophageal reflux disease)   . High cholesterol   . COPD (chronic obstructive pulmonary disease) (HCC)     slight   . Hypertension     patient states b/p up and down not on meds    Social History  Substance Use Topics  . Smoking status: Former Smoker -- 0.50 packs/day for 60 years    Types: Cigarettes    Quit date: 10/27/2006  . Smokeless tobacco: Never Used  . Alcohol Use: No    No family history on file.  Allergies  Allergen  Reactions  . Nabumetone Itching and Swelling    Objective: Filed Vitals:   01/30/16 1539  BP: 95/58  Pulse: 66  Temp: 98.3 F (36.8 C)  TempSrc: Oral  Weight: 182 lb (82.555 kg)   Body mass index is 29.39 kg/(m^2).  Physical Exam  Constitutional: She is oriented to person, place, and time.   She is smiling and in good spirits.  HENT:  Mouth/Throat: No oropharyngeal exudate.  Eyes: Conjunctivae are normal.  Cardiovascular: Normal rate and regular rhythm.   No murmur heard. Pulmonary/Chest: Breath sounds normal. She has no wheezes. She has no rales.  Abdominal: Soft. There is no  tenderness.  Neurological: She is alert and oriented to person, place, and time.  Skin: No rash noted.  Psychiatric: Mood and affect normal.    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 I told her again that she will probably need to be on therapy for at least one year. She will follow-up in 2 months.          Brooke Bickers, MD [facility] [phone] pager   [phone] cell 01/30/2016, 4:22 PM

## 2016-02-15 ENCOUNTER — Ambulatory Visit (INDEPENDENT_AMBULATORY_CARE_PROVIDER_SITE_OTHER)
Admission: RE | Admit: 2016-02-15 | Discharge: 2016-02-15 | Disposition: A | Discharge status: Home / Self Care | Payer: Medicaid Other | Source: Ambulatory Visit | Attending: Pulmonary Disease | Admitting: Pulmonary Disease

## 2016-02-15 DIAGNOSIS — J984 Other disorders of lung: Secondary | ICD-10-CM

## 2016-02-15 DIAGNOSIS — R911 Solitary pulmonary nodule: Secondary | ICD-10-CM | POA: Diagnosis not present

## 2016-02-15 DIAGNOSIS — IMO0001 Reserved for inherently not codable concepts without codable children: Secondary | ICD-10-CM

## 2016-02-15 DIAGNOSIS — A319 Mycobacterial infection, unspecified: Secondary | ICD-10-CM | POA: Diagnosis not present

## 2016-02-22 ENCOUNTER — Encounter: Payer: Self-pay | Admitting: Pulmonary Disease

## 2016-02-22 ENCOUNTER — Ambulatory Visit (INDEPENDENT_AMBULATORY_CARE_PROVIDER_SITE_OTHER): Payer: Medicaid Other | Admitting: Pulmonary Disease

## 2016-02-22 VITALS — BP 118/70 | HR 59 | Ht 66.0 in | Wt 177.2 lb

## 2016-02-22 DIAGNOSIS — J984 Other disorders of lung: Secondary | ICD-10-CM | POA: Diagnosis not present

## 2016-02-22 DIAGNOSIS — A310 Pulmonary mycobacterial infection: Secondary | ICD-10-CM | POA: Diagnosis not present

## 2016-02-22 NOTE — Patient Instructions (Signed)
   Please call my office if you have any new breathing problems, fever, chills, sweats, or continued weight loss.   Please call my office if you develop any new chest pain or discomfort.   I will see you back in July after your CT scan. Please call my office if you have any questions.   TESTS ORDERED: 1. CT chest without contrast July 2017

## 2016-02-22 NOTE — Progress Notes (Signed)
Subjective:    Patient ID: Brooke Mora, female    DOB: [birth year], 82 y.o.   MRN: [id number]  C.C.:  Follow-up for MAI Infection & Cavitary RUL Lung Opacity & LUL Nodular Opacity.  HPI  MAI Infection:  Followed by Dr. [name]. Currently on azithromycin, rifampin, & ethambutol. She does report some sleepiness with the ethambutol. Denies any nausea, emesis or abdominal pain. No fever, chills, or sweats. She has lost 6 pounds but has been eating healthier. She reports yesterday she noticed a brief cough productive of a clear mucus.   Cavitary RUL Opacity & Nodular LUL Opacity:  Previously started on treatment for MAI by ID. Denies any chest pain or pressure.    Review of Systems No adenopathy in her neck, groin, or axilla. No headaches, numbness, or tingling.   Allergies  Allergen Reactions  . Nabumetone Itching and Swelling    Current Outpatient Prescriptions on File Prior to Visit  Medication Sig Dispense Refill  . aspirin 81 MG EC tablet Take 1 tablet (81 mg total) by mouth daily. 30 tablet 11  . atorvastatin (LIPITOR) 80 MG tablet Take 1 tablet (80 mg total) by mouth daily at 6 PM. 30 tablet 6  . azithromycin (ZITHROMAX) 250 MG tablet Take 1 tablet (250 mg total) by mouth daily. 30 tablet 11  . ethambutol (MYAMBUTOL) 400 MG tablet Take 3 tablets (1,200 mg total) by mouth daily. 90 tablet 11  . metoprolol tartrate (LOPRESSOR) 25 MG tablet Take 0.5 tablets (12.5 mg total) by mouth 2 (two) times daily. 60 tablet 6  . nitroGLYCERIN (NITROSTAT) 0.4 MG SL tablet Place 1 tablet (0.4 mg total) under the tongue every 5 (five) minutes x 3 doses as needed for chest pain. 25 tablet 3  . rifampin (RIFADIN) 300 MG capsule Take 2 capsules (600 mg total) by mouth daily. 60 capsule 11   No current facility-administered medications on file prior to visit.    Past Medical History  Diagnosis Date  . Kidney stones   . GERD (gastroesophageal reflux disease)   . High cholesterol   . COPD (chronic  obstructive pulmonary disease) (HCC)     slight   . Hypertension     patient states b/p up and down not on meds    Past Surgical History  Procedure Laterality Date  . Hernia repair    . Knee surgery    . Abdominal hysterectomy    . Cataract extraction    . Incisional hernia repair N/A 07/18/2014    Procedure: REPAIR OF INACERATED INCISIONAL HERNIA WITH MESH;  Surgeon: Armandina Gemma, MD;  Location: [facility];  Service: General;  Laterality: N/A;  . Insertion of mesh N/A 07/18/2014    Procedure: INSERTION OF MESH;  Surgeon: Armandina Gemma, MD;  Location: [facility];  Service: General;  Laterality: N/A;  . Video bronchoscopy Bilateral 11/05/2015    Procedure: VIDEO BRONCHOSCOPY WITHOUT FLUORO;  Surgeon: Javier Glazier, MD;  Location: [place];  Service: Cardiopulmonary;  Laterality: Bilateral;    No family history on file.  Social History   Social History  . Marital Status: Widowed    Spouse Name: N/A  . Number of Children: N/A  . Years of Education: N/A   Social History Main Topics  . Smoking status: Former Smoker -- 0.50 packs/day for 60 years    Types: Cigarettes    Quit date: 10/27/2006  . Smokeless tobacco: Never Used  . Alcohol Use: No  . Drug Use: No  .  Sexual Activity: Not Asked   Other Topics Concern  . None   Social History Narrative      Objective:   Physical Exam BP 118/70 mmHg  Pulse 59  Ht '5\' 6"'$  (1.676 m)  Wt 177 lb 3.2 oz (80.377 kg)  BMI 28.61 kg/m2  SpO2 97% General:  Awake. No acute distress.  Accompanied by granddaughter today. Mild central obesity. Integument:  Warm & dry. No bruising. No rash on exposed skin. HEENT:  Moist mucus membranes. No oral ulcers. No scleral injection. Cardiovascular:  Regular rate. No edema. Normal S1 & S2.  Pulmonary:  Clear to auscultation bilaterally. Normal work of breathing on room air. Abdomen: Soft. Normal bowel sounds. Nontender. Musculoskeletal:  Normal bulk and tone. No joint deformity or effusion  appreciated.  PFT 12/21/15:  FVC 2.31 L (91%) FEV1 1.78 L (94%) FEV1/FVC 0.77 FEF 25-75 1.63 L (131%) no bronchodilator response TLC 10.16 L (204%) RV 329% ERV 131% (patient unable to perform DLCO)  IMAGING CT CHEST W/O 02/15/16 (personally reviewed by me): 1.3 cm right paratracheal lymph node unchanged. No pericardial or pleural effusion. Upper lobe predominant emphysematous changes. 5.7 x 5.7 cm right upper lobe cavitary mass with a small amount of fluid within the cavity now. 2.1 x 2.6 left upper lobe nodular opacity unchanged. No new or enlarging nodules.  CT Chest W/O 11/02/15 (previously reviewed by me):  Thick walled cavitary apico-posterior RUL with subpleural opacity in posterior LUL. No pathologic mediastinal adenopathy. Mild centralobular & paraseptal emphysema. Dilated main pulmonary artery. Hepatic steatosis noted.  PET CT 11/28/15 (previously reviewed by me): Cavitary right upper lobe mass measuring 5.9 x 6.8 cm with max SUV 6.0. Posterior left upper lobe nodularity including 2.6 cm component with max SUV 5.9. No other suspicious areas of hypermetabolism. Infrarenal abdominal aortic aneurysm 2.8 cm. Colonic diverticulosis noted. Fat-containing right inguinal hernia.  PATHOLOGY RUL BAL (11/05/15): No malignancy.  MICROBIOLOGY: RUL BAL (1/9) AFB: Smear Negative / Ctx negative Fungal: Smear Negative / Ctx negative Routine: Normal oral flora  Sputum AFB (11/02/15): Smear Negative / MAC Quantiferon-TB (11/02/15): Negative Blood Ctx x2 (11/02/15): Negative    LABS 12/14/15 RF:  <10 Anti-CCP:  <16 SSA:  <1 SSB:  <1  11/07/15 [place]: 1:320 (positive) C-ANCA:  <1:20 P-ANCA:  <1:20 Atypical ANCA:  <1:20 PR-3: <3.5 MPO:  <9    Assessment & Plan:  82 -year-old female with bilateral upper lobe nodules with right upper lobe being predominantly cavitary. Patient's sputum culture resulted with nontuberculous mycobacteria (Mycobacterium avium intracellulare). Patient seems to be  tolerating her triple drug therapy well. I reviewed her chest CT scan with both she and her granddaughter who was present today. There is no obvious progression of the upper lobe lesions or significantly enlarging mediastinal adenopathy that would suggest malignancy. Even so, I feel that continued close monitoring is reasonable. I instructed the patient and her granddaughter contact our office if she developed any new symptoms before her next appointment.  1. Bilateral Upper Lobe Nodules: Currently on treatment for nontuberculous mycobacteria. Repeat CT chest without contrast July 2017. 2. Nontuberculous mycobacterial infection: Following with Dr. [name]. Currently on triple agent therapy. 3. Follow-up: Return to clinic in 3 months after CT scan is complete.  Sonia Baller Ashok Cordia, M.D. [facility] Pager:  [phone] After 3pm or if no response, call [phone] 11:38 AM 02/22/2016

## 2016-04-01 ENCOUNTER — Ambulatory Visit (INDEPENDENT_AMBULATORY_CARE_PROVIDER_SITE_OTHER): Payer: Medicaid Other | Admitting: Internal Medicine

## 2016-04-01 ENCOUNTER — Encounter: Payer: Self-pay | Admitting: Internal Medicine

## 2016-04-01 VITALS — BP 118/72 | HR 58 | Temp 98.2°F | Wt 177.0 lb

## 2016-04-01 DIAGNOSIS — A310 Pulmonary mycobacterial infection: Secondary | ICD-10-CM

## 2016-04-01 NOTE — Progress Notes (Signed)
   Subjective:    Patient ID: Brooke Mora, female    DOB: 1928-11-10, 82 y.o.   MRN: [id number]  HPI [name] is a 82 year old female diagnosed with Mycobacterium avium.  Patient is accompanied by her granddaughter for today's routine follow-up.  She was last seen 01/30/16 in which patient reported she had not been taking her medications as she was confused about the length of treatment.  However, today, she reports that she is taking Azythromycin, Ethambutol, and Rifampin daily.  Although patient dislikes the number of pills she has to take daily she denies any missed doses.  Additionally, she is tolerating the medications well (no longer having nausea).  Review of Systems  Constitutional: Positive for appetite change and unexpected weight change. Negative for fever and chills.  Eyes: Negative for photophobia and visual disturbance.  Respiratory: Positive for cough (reports no different than previously and infrequent). Negative for chest tightness and shortness of breath.   Cardiovascular: Negative for chest pain.  Gastrointestinal: Positive for diarrhea (reports infrequent and likely related to foods she eats). Negative for nausea, abdominal pain, constipation and blood in stool.  Genitourinary: Negative for dysuria (Patient does report orange colored urine, but denies burning with urination) and difficulty urinating.  Skin: Negative for rash.      Objective:   Physical Exam  Constitutional: She is oriented to person, place, and time. She appears well-developed and well-nourished.  HENT:  Mouth/Throat: No oropharyngeal exudate.  Eyes: Pupils are equal, round, and reactive to light.  Cardiovascular: Normal rate, regular rhythm and normal heart sounds.   No murmur heard. Pulmonary/Chest: Effort normal and breath sounds normal. She has no wheezes. She has no rales.  Abdominal: Soft. Bowel sounds are normal. She exhibits no distension. There is no tenderness.  Lymphadenopathy:    She  has no cervical adenopathy.  Neurological: She is alert and oriented to person, place, and time.      Assessment & Plan:

## 2016-04-01 NOTE — Assessment & Plan Note (Addendum)
Patient seen today for routine follow-up.  Patient tolerating medications well with the exception of decreased appetite.  Patient advised to eat small meals even if she doesn't feel hungry.  Patient verbalized understanding.  Additionally, patient's weight will be monitored.  Results of CT scan (January vs April) discussed with patient and granddaughter.  Patient informed that improvement seen even though she had only been taking medications x 1 month when repeat CT scan was performed.  Therefore, it is important that she continue with treatment.  Patient to return to clinic in 3 months.  Addendum: I have seen and examined Brooke Mora with Harrold Donath, RN. Brooke Mora has had some anorexia that is probably a side effect of her 3 drug regimen for Mycobacterium avium. She is having some pill fatigue as well but agrees that she can continue to take the medication for now. She's had no recent change in her chronic cough. A repeat CT scan in late April showed very slight reduction in the size of the right upper lobe cavity and smaller left upper lobe nodule. No new lesions were noted. She is in agreement with continuing azithromycin, ethambutol and rifampin following up here in 3 months.

## 2016-05-05 ENCOUNTER — Ambulatory Visit (HOSPITAL_BASED_OUTPATIENT_CLINIC_OR_DEPARTMENT_OTHER)
Admission: RE | Admit: 2016-05-05 | Discharge: 2016-05-05 | Disposition: A | Discharge status: Home / Self Care | Payer: Medicaid Other | Source: Ambulatory Visit | Attending: Pulmonary Disease | Admitting: Pulmonary Disease

## 2016-05-05 DIAGNOSIS — A310 Pulmonary mycobacterial infection: Secondary | ICD-10-CM | POA: Insufficient documentation

## 2016-05-05 DIAGNOSIS — J9811 Atelectasis: Secondary | ICD-10-CM | POA: Diagnosis not present

## 2016-05-05 DIAGNOSIS — J984 Other disorders of lung: Secondary | ICD-10-CM | POA: Insufficient documentation

## 2016-05-05 DIAGNOSIS — R918 Other nonspecific abnormal finding of lung field: Secondary | ICD-10-CM | POA: Diagnosis not present

## 2016-05-08 ENCOUNTER — Telehealth: Payer: Self-pay | Admitting: Pulmonary Disease

## 2016-05-08 NOTE — Telephone Encounter (Signed)
CT CHEST W/O 05/05/16 (personally reviewed by me): No axillary adenopathy. No pathologic mediastinal adenopathy. Slight decrease in size in right apical cavitary lesion. Minimal amount of fluid within right upper lobe cavity. Left upper lobe nodule relatively unchanged in size. No pleural effusion or thickening. 6.1 cm left hepatic lobe cyst. No pericardial effusion.

## 2016-05-09 ENCOUNTER — Encounter: Payer: Self-pay | Admitting: Pulmonary Disease

## 2016-05-09 ENCOUNTER — Ambulatory Visit (INDEPENDENT_AMBULATORY_CARE_PROVIDER_SITE_OTHER): Payer: Medicaid Other | Admitting: Pulmonary Disease

## 2016-05-09 VITALS — BP 130/78 | HR 61 | Ht 66.0 in | Wt 174.0 lb

## 2016-05-09 DIAGNOSIS — R918 Other nonspecific abnormal finding of lung field: Secondary | ICD-10-CM

## 2016-05-09 DIAGNOSIS — A310 Pulmonary mycobacterial infection: Secondary | ICD-10-CM | POA: Diagnosis not present

## 2016-05-09 NOTE — Patient Instructions (Signed)
   Continue taking your medications as prescribed.   Call me if you have any new trouble breathing or you start to cough up any yellow, green, or bloody mucus.   I will see you back in 6 months or sooner if needed. We will do a CT scan of your chest prior to your appointment.    TESTS ORDERED: 1. CT Chest w/o contrast in 6 months

## 2016-05-09 NOTE — Progress Notes (Signed)
Subjective:    Patient ID: Brooke Mora, female    DOB: 11-03-1928, 82 y.o.   MRN: [id number]  C.C.:  Follow-up for MAI Infection & Cavitary RUL Lung Opacity & LUL Nodular Opacity.  HPI  MAI Infection:  Followed by Dr. [name]. Currently on azithromycin, rifampin, & ethambutol. Denies any fever, chills, or sweats. Denies any coughing or hemoptysis.   Cavitary RUL Opacity & Nodular LUL Opacity:  Stable to mildly improved in right upper lung. She reports 15 days ago she began to notice a constant a right-sided constant pain approximately 15 days ago. She has been using over-the-counter pain patches to help.    Review of Systems No nausea, emesis, or abdominal pain. She has noticed a "numbness" in her toes that doesn't seem progressive. No other weakness or tingling. She reports she has had things tingling before and reports it does resolve. No rashes or bruising.   Allergies  Allergen Reactions  . Nabumetone Itching and Swelling    Current Outpatient Prescriptions on File Prior to Visit  Medication Sig Dispense Refill  . aspirin 81 MG EC tablet Take 1 tablet (81 mg total) by mouth daily. 30 tablet 11  . atorvastatin (LIPITOR) 80 MG tablet Take 1 tablet (80 mg total) by mouth daily at 6 PM. 30 tablet 6  . azithromycin (ZITHROMAX) 250 MG tablet Take 1 tablet (250 mg total) by mouth daily. 30 tablet 11  . ethambutol (MYAMBUTOL) 400 MG tablet Take 3 tablets (1,200 mg total) by mouth daily. 90 tablet 11  . metoprolol succinate (TOPROL-XL) 25 MG 24 hr tablet Take 1 tablet by mouth 2 (two) times daily.  2  . nitroGLYCERIN (NITROSTAT) 0.4 MG SL tablet Place 1 tablet (0.4 mg total) under the tongue every 5 (five) minutes x 3 doses as needed for chest pain. 25 tablet 3  . rifampin (RIFADIN) 300 MG capsule Take 2 capsules (600 mg total) by mouth daily. 60 capsule 11   No current facility-administered medications on file prior to visit.    Past Medical History  Diagnosis Date  . Kidney stones    . GERD (gastroesophageal reflux disease)   . High cholesterol   . COPD (chronic obstructive pulmonary disease) (HCC)     slight   . Hypertension     patient states b/p up and down not on meds    Past Surgical History  Procedure Laterality Date  . Hernia repair    . Knee surgery    . Abdominal hysterectomy    . Cataract extraction    . Incisional hernia repair N/A 07/18/2014    Procedure: REPAIR OF INACERATED INCISIONAL HERNIA WITH MESH;  Surgeon: Armandina Gemma, MD;  Location: [facility];  Service: General;  Laterality: N/A;  . Insertion of mesh N/A 07/18/2014    Procedure: INSERTION OF MESH;  Surgeon: Armandina Gemma, MD;  Location: [facility];  Service: General;  Laterality: N/A;  . Video bronchoscopy Bilateral 11/05/2015    Procedure: VIDEO BRONCHOSCOPY WITHOUT FLUORO;  Surgeon: Javier Glazier, MD;  Location: [place];  Service: Cardiopulmonary;  Laterality: Bilateral;    No family history on file.  Social History   Social History  . Marital Status: Widowed    Spouse Name: N/A  . Number of Children: N/A  . Years of Education: N/A   Social History Main Topics  . Smoking status: Former Smoker -- 0.50 packs/day for 60 years    Types: Cigarettes    Quit date: 10/27/2006  . Smokeless  tobacco: Never Used  . Alcohol Use: No  . Drug Use: No  . Sexual Activity: Not Asked   Other Topics Concern  . None   Social History Narrative      Objective:   Physical Exam BP 130/78 mmHg  Pulse 61  Ht '5\' 6"'$  (1.676 m)  Wt 174 lb (78.926 kg)  BMI 28.10 kg/m2  SpO2 96% General:  Awake. No distress.  Accompanied by Translator today. Integument:  Warm & dry. No bruising. No rash on exposed skin. HEENT:  Moist mucus membranes. No oral ulcers. No scleral injection. No nasal turbinate swelling. Cardiovascular:  Regular rate. No edema. Normal S1 & S2.  Pulmonary:  Clear on auscultation. No chest wall deformity. Symmetric chest wall expansion. Normal work of breathing on room air Abdomen: Soft.  Normal bowel sounds. Mildly protuberant. Musculoskeletal:  Normal bulk and tone. No joint deformity or effusion appreciated.  PFT 12/21/15:  FVC 2.31 L (91%) FEV1 1.78 L (94%) FEV1/FVC 0.77 FEF 25-75 1.63 L (131%) no bronchodilator response TLC 10.16 L (204%) RV 329% ERV 131% (patient unable to perform DLCO)  IMAGING CT CHEST W/O 05/05/16 (personally reviewed by me): No axillary adenopathy. No pathologic mediastinal adenopathy. Slight decrease in size in right apical cavitary lesion. Minimal amount of fluid within right upper lobe cavity. Left upper lobe nodule relatively unchanged in size. No pleural effusion or thickening. 6.1 cm left hepatic lobe cyst. No pericardial effusion.  CT CHEST W/O 02/15/16 (previously reviewed by me): 1.3 cm right paratracheal lymph node unchanged. No pericardial or pleural effusion. Upper lobe predominant emphysematous changes. 5.7 x 5.7 cm right upper lobe cavitary mass with a small amount of fluid within the cavity now. 2.1 x 2.6 left upper lobe nodular opacity unchanged. No new or enlarging nodules.  CT Chest W/O 11/02/15 (previously reviewed by me):  Thick walled cavitary apico-posterior RUL with subpleural opacity in posterior LUL. No pathologic mediastinal adenopathy. Mild centralobular & paraseptal emphysema. Dilated main pulmonary artery. Hepatic steatosis noted.  PET CT 11/28/15 (previously reviewed by me): Cavitary right upper lobe mass measuring 5.9 x 6.8 cm with max SUV 6.0. Posterior left upper lobe nodularity including 2.6 cm component with max SUV 5.9. No other suspicious areas of hypermetabolism. Infrarenal abdominal aortic aneurysm 2.8 cm. Colonic diverticulosis noted. Fat-containing right inguinal hernia.  PATHOLOGY RUL BAL (11/05/15): No malignancy.  MICROBIOLOGY: RUL BAL (1/9) AFB: Smear Negative / Ctx negative Fungal: Smear Negative / Ctx negative Routine: Normal oral flora  Sputum AFB (11/02/15): Smear Negative / MAC Quantiferon-TB (11/02/15):  Negative Blood Ctx x2 (11/02/15): Negative    LABS 12/14/15 RF:  <10 Anti-CCP:  <16 SSA:  <1 SSB:  <1  11/07/15 [place]: 1:320 (positive) C-ANCA:  <1:20 P-ANCA:  <1:20 Atypical ANCA:  <1:20 PR-3: <3.5 MPO:  <9    Assessment & Plan:  85 -year-old female with bilateral upper lobe nodules with right upper lobe being predominantly cavitary. Patient's sputum culture resulted with nontuberculous mycobacteria (Mycobacterium avium intracellulare). Patient continuing to tolerate triple antibiotic treatment well. Chest imaging reviewed with patient with the help of translator today showing some mild improvement in right upper lobe cavitary opacity. No obvious signs of progression that would suggest underlying malignancy. I would prefer to continue to follow her chest imaging to ensure complete resolution and continued clinical improvement with antibiotic therapy. I instructed the patient contact my office if she had any new breathing problems or questions before her next appointment.  1. Bilateral Upper Lobe Nodules: Currently on treatment  for nontuberculous mycobacteria. Repeat CT chest without contrast in 6 months. 2. Nontuberculous mycobacterial infection: Following with Dr. [name]. Continuing on triple agent therapy. 3. Follow-up: Return to clinic in 6 months after CT scan is complete.  Sonia Baller Ashok Cordia, M.D. [facility] Pulmonary [facility] Critical Care Pager:  [phone] After 3pm or if no response, call [phone] 9:23 AM 05/09/2016

## 2016-07-08 ENCOUNTER — Ambulatory Visit: Payer: Medicaid Other | Admitting: Internal Medicine

## 2016-11-17 ENCOUNTER — Ambulatory Visit (HOSPITAL_BASED_OUTPATIENT_CLINIC_OR_DEPARTMENT_OTHER)
Admission: RE | Admit: 2016-11-17 | Discharge: 2016-11-17 | Disposition: A | Discharge status: Home / Self Care | Payer: Medicaid Other | Source: Ambulatory Visit | Attending: Pulmonary Disease | Admitting: Pulmonary Disease

## 2016-11-17 DIAGNOSIS — R918 Other nonspecific abnormal finding of lung field: Secondary | ICD-10-CM | POA: Diagnosis present

## 2016-11-17 DIAGNOSIS — J984 Other disorders of lung: Secondary | ICD-10-CM | POA: Diagnosis not present

## 2016-12-01 ENCOUNTER — Ambulatory Visit (INDEPENDENT_AMBULATORY_CARE_PROVIDER_SITE_OTHER): Payer: Medicaid Other | Admitting: Pulmonary Disease

## 2016-12-01 ENCOUNTER — Encounter: Payer: Self-pay | Admitting: Pulmonary Disease

## 2016-12-01 VITALS — BP 120/72 | HR 72 | Ht 66.0 in | Wt 181.0 lb

## 2016-12-01 DIAGNOSIS — Z23 Encounter for immunization: Secondary | ICD-10-CM | POA: Diagnosis not present

## 2016-12-01 DIAGNOSIS — R918 Other nonspecific abnormal finding of lung field: Secondary | ICD-10-CM

## 2016-12-01 DIAGNOSIS — A310 Pulmonary mycobacterial infection: Secondary | ICD-10-CM | POA: Diagnosis not present

## 2016-12-01 NOTE — Patient Instructions (Signed)
   You should get a call from Dr. Cammie Sickle office to discuss your medicines for the infection this week.  Call me if you develop any new breathing problems or your cough gets worse.  I will see you back in 6 months or sooner if needed.

## 2016-12-01 NOTE — Progress Notes (Signed)
Subjective:    Patient ID: Brooke Mora, female    DOB: 1929/04/10, 83 y.o.   MRN: [id number]  C.C.:  Follow-up for MAI Infection & Bilateral Upper Lobe Opacities.  HPI  MAI Infection:  Following with infectious diseases. Patient stopped taking her medications of her own accord. She stopped the medication 2 months ago. She has not yet notified her ID specialist. She continues to have an intermittent cough that is rarely productive of a clear mucus. Denies any hemoptysis.   Bilateral Upper Lobe Opacities:  Patient has a cavitary right upper lobe opacity and more nodule left upper lobe opacity. 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    Review of Systems Denies any fever, chills, or sweats. No chest tightness, pain, or pressure. No abdominal pain, nausea, or emesis.   Allergies  Allergen Reactions  . Nabumetone Itching and Swelling    Current Outpatient Prescriptions on File Prior to Visit  Medication Sig Dispense Refill  . aspirin 81 MG EC tablet Take 1 tablet (81 mg total) by mouth daily. (Patient not taking: Reported on 12/01/2016) 30 tablet 11  . atorvastatin (LIPITOR) 80 MG tablet Take 1 tablet (80 mg total) by mouth daily at 6 PM. (Patient not taking: Reported on 12/01/2016) 30 tablet 6  . azithromycin (ZITHROMAX) 250 MG tablet Take 1 tablet (250 mg total) by mouth daily. (Patient not taking: Reported on 12/01/2016) 30 tablet 11  . ethambutol (MYAMBUTOL) 400 MG tablet Take 3 tablets (1,200 mg total) by mouth daily. (Patient not taking: Reported on 12/01/2016) 90 tablet 11  . metoprolol succinate (TOPROL-XL) 25 MG 24 hr tablet Take 1 tablet by mouth 2 (two) times daily.  2  . nitroGLYCERIN (NITROSTAT) 0.4 MG SL tablet Place 1 tablet (0.4 mg total) under the tongue every 5 (five) minutes x 3 doses as needed for chest pain. (Patient not taking: Reported on 12/01/2016) 25 tablet 3  . rifampin (RIFADIN) 300 MG capsule Take 2 capsules (600 mg total) by mouth daily. (Patient  not taking: Reported on 12/01/2016) 60 capsule 11   No current facility-administered medications on file prior to visit.     Past Medical History:  Diagnosis Date  . COPD (chronic obstructive pulmonary disease) (HCC)    slight   . GERD (gastroesophageal reflux disease)   . High cholesterol   . Hypertension    patient states b/p up and down not on meds  . Kidney stones     Past Surgical History:  Procedure Laterality Date  . ABDOMINAL HYSTERECTOMY    . CATARACT EXTRACTION    . HERNIA REPAIR    . INCISIONAL HERNIA REPAIR N/A 07/18/2014   Procedure: REPAIR OF INACERATED INCISIONAL HERNIA WITH MESH;  Surgeon: Armandina Gemma, MD;  Location: [facility];  Service: General;  Laterality: N/A;  . INSERTION OF MESH N/A 07/18/2014   Procedure: INSERTION OF MESH;  Surgeon: Armandina Gemma, MD;  Location: [facility];  Service: General;  Laterality: N/A;  . KNEE SURGERY    . VIDEO BRONCHOSCOPY Bilateral 11/05/2015   Procedure: VIDEO BRONCHOSCOPY WITHOUT FLUORO;  Surgeon: Javier Glazier, MD;  Location: [facility];  Service: Cardiopulmonary;  Laterality: Bilateral;    No family history on file.  Social History   Social History  . Marital status: Widowed    Spouse name: N/A  . Number of children: N/A  . Years of education: N/A   Social History Main Topics  . Smoking status: Former Smoker    Packs/day: 0.50  Years: 60.00    Types: Cigarettes    Quit date: 10/27/2006  . Smokeless tobacco: Never Used  . Alcohol use No  . Drug use: No  . Sexual activity: Not Asked   Other Topics Concern  . None   Social History Narrative  . None      Objective:   Physical Exam BP 120/72 (BP Location: Right Arm, Patient Position: Sitting, Cuff Size: Normal)   Pulse 72   Ht _0  (1.676 m)   Wt 181 lb (82.1 kg) Comment: per pt  SpO2 94%   BMI 29.21 kg/m   General:  Comfortable. Well-dressed. Granddaughter with her today. Integument:  Warm & dry. No rash on exposed skin. No bruising on exposed  skin. Extremities:  No cyanosis or clubbing.  Lymphatics: No appreciated cervical or supraclavicular lymphadenopathy. HEENT:  Moist mucus membranes. No oral ulcers. No scleral injection or icterus.  Cardiovascular:  Regular rate and rhythm. No edema.  Pulmonary:  Clear with auscultation. No accessory muscle use on room air. Speaking in complete sentences. Abdomen: Soft. Normal bowel sounds. Nondistended. Nontender. Musculoskeletal:  Normal bulk and tone. No joint deformity or effusion appreciated.  PFT 12/21/15:  FVC 2.31 L (91%) FEV1 1.78 L (94%) FEV1/FVC 0.77 FEF 25-75 1.63 L (131%) no bronchodilator response TLC 10.16 L (204%) RV 329% ERV 131% (patient unable to perform DLCO)  IMAGING CT CHEST W/O 11/17/16 (personally reviewed by me): Priest size of cavitary right upper lobe lesion with continued air-fluid level. No significant change with nodularity in the left upper lobe. No pathologic mediastinal adenopathy. Mild apical predominant emphysema Appreciated. No pleural effusion or thickening. No pericardial effusion.  CT CHEST W/O 05/05/16 (previously reviewed by me): No axillary adenopathy. No pathologic mediastinal adenopathy. Slight decrease in size in right apical cavitary lesion. Minimal amount of fluid within right upper lobe cavity. Left upper lobe nodule relatively unchanged in size. No pleural effusion or thickening. 6.1 cm left hepatic lobe cyst. No pericardial effusion.  CT CHEST W/O 02/15/16 (previously reviewed by me): 1.3 cm right paratracheal lymph node unchanged. No pericardial or pleural effusion. Upper lobe predominant emphysematous changes. 5.7 x 5.7 cm right upper lobe cavitary mass with a small amount of fluid within the cavity now. 2.1 x 2.6 left upper lobe nodular opacity unchanged. No new or enlarging nodules.  CT Chest W/O 11/02/15 (previously reviewed by me):  Thick walled cavitary apico-posterior RUL with subpleural opacity in posterior LUL. No pathologic mediastinal  adenopathy. Mild centralobular & paraseptal emphysema. Dilated main pulmonary artery. Hepatic steatosis noted.  PET CT 11/28/15 (previously reviewed by me): Cavitary right upper lobe mass measuring 5.9 x 6.8 cm with max SUV 6.0. Posterior left upper lobe nodularity including 2.6 cm component with max SUV 5.9. No other suspicious areas of hypermetabolism. Infrarenal abdominal aortic aneurysm 2.8 cm. Colonic diverticulosis noted. Fat-containing right inguinal hernia.  PATHOLOGY RUL BAL (11/05/15): No malignancy.  MICROBIOLOGY: RUL BAL (1/9) AFB: Smear Negative / Ctx negative Fungal: Smear Negative / Ctx negative Routine: Normal oral flora  Sputum AFB (11/02/15): Smear Negative / MAC Quantiferon-TB (11/02/15): Negative Blood Ctx x2 (11/02/15): Negative    LABS 12/14/15 RF:  <10 Anti-CCP:  <16 SSA:  <1 SSB:  <1  11/07/15 [place]: 1:320 (positive) C-ANCA:  <1:20 P-ANCA:  <1:20 Atypical ANCA:  <1:20 PR-3: <3.5 MPO:  <9    Assessment & Plan:  83 y.o. female with bilateral upper lobe nodules with right upper lobe being predominantly cavitary. Previous sputum culture with Mycobacterium avium  intracellulare. I had a lengthy discussion with the patient and her granddaughter today regarding the need for medication therapy for her underlying nontuberculous mycobacterial infection. I reviewed her CT imaging which did show some improvement within her right upper lobe cavitary opacity and likely evolving scar formation. The patient was not having any adverse effects from her medication and was simply fatigued due to the frequency of dosing. I urged the patient to notify her infectious diseases specialist which she has not done at this point. She seems to be relatively symptomatic with regards to her underlying emphysema which appears to be mild and shows no signs of airway obstruction on previous pulmonary function testing. I instructed the patient contact my office if she had any new breathing problems or  questions before her next appointment.  1. Bilateral Upper Lobe Nodules: Likely due to nontuberculous mycobacterial infection. Holding off on further CT imaging at this time. Plan to reassess the need for imaging or pulmonary function testing depending upon symptoms at next appointment. 2. Nontuberculous mycobacterial infection: I personally messaged electronically message to Dr. [name] to make him aware of her nonadherence to her medication regimen. 3. Pulmonary Emphysema: Holding off on further testing at this time. 4. Health Maintenance: Administering high-dose influenza vaccine today. 5. Follow-up: Return to clinic in 6 months or sooner if needed.  Sonia Baller Ashok Cordia, M.D. [facility] Pager:  [phone] After 3pm or if no response, call [phone] 9:22 AM 12/01/16

## 2017-06-22 ENCOUNTER — Ambulatory Visit (INDEPENDENT_AMBULATORY_CARE_PROVIDER_SITE_OTHER): Payer: Medicaid Other | Admitting: Pulmonary Disease

## 2017-06-22 ENCOUNTER — Encounter: Payer: Self-pay | Admitting: Pulmonary Disease

## 2017-06-22 VITALS — BP 118/76 | HR 57 | Ht 65.0 in | Wt 181.6 lb

## 2017-06-22 DIAGNOSIS — R918 Other nonspecific abnormal finding of lung field: Secondary | ICD-10-CM

## 2017-06-22 DIAGNOSIS — A310 Pulmonary mycobacterial infection: Secondary | ICD-10-CM | POA: Diagnosis not present

## 2017-06-22 NOTE — Patient Instructions (Signed)
   Please call me or Dr. [name] (ID Specialist) if you have any new breathing problems, cough, or fever/chills/sweats.  I have sent a message to Dr. [name] to have his office reach out at your new number to schedule a follow-up.  We will repeat a CT of your chest to see if the nodules in your lungs are growing or regressing.  TESTS ORDERED: 1. CT CHEST W/O ASAP

## 2017-06-22 NOTE — Progress Notes (Signed)
Subjective:    Patient ID: Brooke Mora, female    DOB: May 25, 1929, 83 y.o.   MRN: [id number]  C.C.:  Follow-up for NTM Infection, Bilateral Upper Lobe Opacities, & Pulmonary Emphysema.  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 Follows with ILD. Previously had stopped taking medications 2 months prior last appointment. Does not appear to have seen ID since last appointment per the EMR.  They report they haven't been scheduled a follow-up.   Bilateral upper lobe nodular opacities: Cavitary right upper lobe nodule as well as left upper lobe nodular opacity. 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 No chest pain or pressure.   Pulmonary emphysema: Not currently on treatment. No coughing or wheezing. No wheezing.    Review of Systems No fever, chills or sweats. No abdominal pain, nausea or emesis. No rashes or abnormal bruising.   Allergies  Allergen Reactions  . Nabumetone Itching and Swelling    Current Outpatient Prescriptions on File Prior to Visit  Medication Sig Dispense Refill  . aspirin 81 MG EC tablet Take 1 tablet (81 mg total) by mouth daily. (Patient not taking: Reported on 06/22/2017) 30 tablet 11  . atorvastatin (LIPITOR) 80 MG tablet Take 1 tablet (80 mg total) by mouth daily at 6 PM. (Patient not taking: Reported on 06/22/2017) 30 tablet 6  . azithromycin (ZITHROMAX) 250 MG tablet Take 1 tablet (250 mg total) by mouth daily. (Patient not taking: Reported on 06/22/2017) 30 tablet 11  . ethambutol (MYAMBUTOL) 400 MG tablet Take 3 tablets (1,200 mg total) by mouth daily. (Patient not taking: Reported on 06/22/2017) 90 tablet 11  . metoprolol succinate (TOPROL-XL) 25 MG 24 hr tablet Take 1 tablet by mouth 2 (two) times daily.  2  . nitroGLYCERIN (NITROSTAT) 0.4 MG SL tablet Place 1 tablet (0.4 mg total) under the tongue every 5 (five) minutes x 3 doses as needed for chest pain. (Patient not taking: Reported on 06/22/2017) 25 tablet 3  . rifampin  (RIFADIN) 300 MG capsule Take 2 capsules (600 mg total) by mouth daily. (Patient not taking: Reported on 06/22/2017) 60 capsule 11   No current facility-administered medications on file prior to visit.     Past Medical History:  Diagnosis Date  . COPD (chronic obstructive pulmonary disease) (HCC)    slight   . GERD (gastroesophageal reflux disease)   . High cholesterol   . Hypertension    patient states b/p up and down not on meds  . Kidney stones     Past Surgical History:  Procedure Laterality Date  . ABDOMINAL HYSTERECTOMY    . CATARACT EXTRACTION    . HERNIA REPAIR    . INCISIONAL HERNIA REPAIR N/A 07/18/2014   Procedure: REPAIR OF INACERATED INCISIONAL HERNIA WITH MESH;  Surgeon: Armandina Gemma, MD;  Location: [facility];  Service: General;  Laterality: N/A;  . INSERTION OF MESH N/A 07/18/2014   Procedure: INSERTION OF MESH;  Surgeon: Armandina Gemma, MD;  Location: [facility];  Service: General;  Laterality: N/A;  . KNEE SURGERY    . VIDEO BRONCHOSCOPY Bilateral 11/05/2015   Procedure: VIDEO BRONCHOSCOPY WITHOUT FLUORO;  Surgeon: Javier Glazier, MD;  Location: [facility];  Service: Cardiopulmonary;  Laterality: Bilateral;    History reviewed. No pertinent family history.  Social History   Social History  . Marital status: Widowed    Spouse name: N/A  . Number of children: N/A  . Years of education: N/A   Social History Main Topics  . Smoking  status: Former Smoker    Packs/day: 0.50    Years: 60.00    Types: Cigarettes    Quit date: 10/27/2006  . Smokeless tobacco: Never Used  . Alcohol use No  . Drug use: No  . Sexual activity: Not Asked   Other Topics Concern  . None   Social History Narrative  . None      Objective:   Physical Exam BP 118/76 (BP Location: Left Arm, Cuff Size: Normal)   Pulse (!) 57   Ht _0  (1.651 m)   Wt 181 lb 9.6 oz (82.4 kg)   SpO2 95%   BMI 30.22 kg/m   General:  Awake. Alert. Accompanied by granddaughter today. No  distress. Integument:  Warm & dry. No rash on exposed skin. No bruising on exposed skin. Extremities:  No cyanosis or clubbing.  Lymphatics: No appreciated cervical or supraclavicular lymphadenopathy. HEENT:  Moist mucus membranes. No scleral injection. No oral ulcers Cardiovascular:  Regular rate. No edema. Regular rhythm.  Pulmonary:  Good aeration. Clear to auscultation bilaterally. Normal work of breathing on room air.. Abdomen: Soft. Normal bowel sounds. Mildly protuberant. Musculoskeletal:  Normal bulk and tone. No joint deformity or effusion appreciated.  PFT 12/21/15:  FVC 2.31 L (91%) FEV1 1.78 L (94%) FEV1/FVC 0.77 FEF 25-75 1.63 L (131%) no bronchodilator response TLC 10.16 L (204%) RV 329% ERV 131% (patient unable to perform DLCO)  IMAGING CT CHEST W/O 11/17/16 (previously reviewed by me): Priest size of cavitary right upper lobe lesion with continued air-fluid level. No significant change with nodularity in the left upper lobe. No pathologic mediastinal adenopathy. Mild apical predominant emphysema Appreciated. No pleural effusion or thickening. No pericardial effusion.  CT CHEST W/O 05/05/16 (previously reviewed by me): No axillary adenopathy. No pathologic mediastinal adenopathy. Slight decrease in size in right apical cavitary lesion. Minimal amount of fluid within right upper lobe cavity. Left upper lobe nodule relatively unchanged in size. No pleural effusion or thickening. 6.1 cm left hepatic lobe cyst. No pericardial effusion.  CT CHEST W/O 02/15/16 (previously reviewed by me): 1.3 cm right paratracheal lymph node unchanged. No pericardial or pleural effusion. Upper lobe predominant emphysematous changes. 5.7 x 5.7 cm right upper lobe cavitary mass with a small amount of fluid within the cavity now. 2.1 x 2.6 left upper lobe nodular opacity unchanged. No new or enlarging nodules.  CT Chest W/O 11/02/15 (previously reviewed by me):  Thick walled cavitary apico-posterior RUL with  subpleural opacity in posterior LUL. No pathologic mediastinal adenopathy. Mild centralobular & paraseptal emphysema. Dilated main pulmonary artery. Hepatic steatosis noted.  PET CT 11/28/15 (previously reviewed by me): Cavitary right upper lobe mass measuring 5.9 x 6.8 cm with max SUV 6.0. Posterior left upper lobe nodularity including 2.6 cm component with max SUV 5.9. No other suspicious areas of hypermetabolism. Infrarenal abdominal aortic aneurysm 2.8 cm. Colonic diverticulosis noted. Fat-containing right inguinal hernia.  PATHOLOGY RUL BAL (11/05/15): No malignancy.  MICROBIOLOGY: RUL BAL (1/9) AFB: Smear Negative / Ctx negative Fungal: Smear Negative / Ctx negative Routine: Normal oral flora  Sputum AFB (11/02/15): Smear Negative / MAC Quantiferon-TB (11/02/15): Negative Blood Ctx x2 (11/02/15): Negative    LABS 12/14/15 RF:  <10 Anti-CCP:  <16 SSA:  <1 SSB:  <1  11/07/15 [place]: 1:320 (positive) C-ANCA:  <1:20 P-ANCA:  <1:20 Atypical ANCA:  <1:20 PR-3: <3.5 MPO:  <9    Assessment & Plan:  83 y.o. female with bilateral upper lobe nodules and nontuberculous mycobacterial infection. Patient also  with underlying pulmonary emphysema. I explained to the patient and her granddaughter that she could have progression of disease that she is not appreciating and thus we will need to perform imaging. Patient remains asymptomatic from both her underlying emphysema as well as her nontuberculous mycobacterial infection. I instructed the patient and her granddaughter contact me if she had any new breathing problems or questions before her next appointment.  1. NTM Infection: Currently off treatment. I have sent an electronic message to Dr. [name] with ID to notify him. 2. Bilateral upper lobe nodules: Checking CT chest without contrast. 3. Pulmonary emphysema: Remains asymptomatic off medication. 4. Health maintenance: Holding on immunizations at this time. 5. Follow-up: Return to clinic in  3 months or sooner if needed.  Sonia Baller Ashok Cordia, M.D. [facility] Pager:  [phone] After 3pm or if no response, call [phone] 9:49 AM 06/22/17

## 2017-07-02 ENCOUNTER — Inpatient Hospital Stay: Admission: RE | Admit: 2017-07-02 | Payer: Medicaid Other | Source: Ambulatory Visit

## 2017-07-08 ENCOUNTER — Ambulatory Visit (INDEPENDENT_AMBULATORY_CARE_PROVIDER_SITE_OTHER)
Admission: RE | Admit: 2017-07-08 | Discharge: 2017-07-08 | Disposition: A | Discharge status: Home / Self Care | Payer: Medicaid Other | Source: Ambulatory Visit | Attending: Pulmonary Disease | Admitting: Pulmonary Disease

## 2017-07-08 DIAGNOSIS — R918 Other nonspecific abnormal finding of lung field: Secondary | ICD-10-CM

## 2017-07-21 ENCOUNTER — Ambulatory Visit: Payer: Self-pay | Admitting: Internal Medicine

## 2017-12-07 ENCOUNTER — Ambulatory Visit (INDEPENDENT_AMBULATORY_CARE_PROVIDER_SITE_OTHER)
Admission: RE | Admit: 2017-12-07 | Discharge: 2017-12-07 | Disposition: A | Discharge status: Home / Self Care | Payer: Medicaid Other | Source: Ambulatory Visit | Attending: Internal Medicine | Admitting: Internal Medicine

## 2017-12-07 ENCOUNTER — Encounter: Payer: Self-pay | Admitting: Internal Medicine

## 2017-12-07 ENCOUNTER — Ambulatory Visit (INDEPENDENT_AMBULATORY_CARE_PROVIDER_SITE_OTHER): Payer: Medicaid Other | Admitting: Internal Medicine

## 2017-12-07 VITALS — BP 104/62 | HR 73 | Ht 65.0 in | Wt 177.4 lb

## 2017-12-07 DIAGNOSIS — Z23 Encounter for immunization: Secondary | ICD-10-CM | POA: Diagnosis not present

## 2017-12-07 DIAGNOSIS — A310 Pulmonary mycobacterial infection: Secondary | ICD-10-CM

## 2017-12-07 NOTE — Patient Instructions (Addendum)
For cough > mucinex dm 600 mg up to 2 every 12 hours as needed  Please remember to go to the  x-ray department downstairs in the basement  for your tests - we will call you with the results when they are available.      Please schedule a follow up visit in  6  months but call sooner if needed

## 2017-12-07 NOTE — Progress Notes (Signed)
Subjective:    Patient ID: Brooke Mora, female    DOB: 1928-12-13 .   MRN: [id number]     Brief patient profile: 84 yo latino female quit smoking 2008 previously followed by Dr Ashok Cordia for MPN's/ MAI with last entry 06/22/17:  1. NTM Infection: Currently off treatment.since early 2018 2. Bilateral upper lobe nodules: Checking CT chest without contrast. 3. Pulmonary emphysema: Remains asymptomatic off medication.   rec  Please call me or Dr. [name] (ID Specialist) if you have any new breathing problems, cough, or fever/chills/sweats.  I have sent a message to Dr. [name] to have his office reach out at your new number to schedule a follow-up.  We will repeat a CT of your chest to see if the nodules in your lungs are growing or regressing.     12/07/2017  f/u ov/Jernie Schutt re: transition of care re MPNs  / MAI prior rx stopped around 10/2016 Chief Complaint  Patient presents with  . Follow-up    Getting over a cold and has occ non prod cough. Breathing is overall doing well.    Since stopped TB rx > 1  Year prior to OV   Dyspnea:  MMRC2 = can't walk a nl pace on a flat grade s sob but does fine slow and flat slows down more due to knee R Cough: sporadic/ daytime/ dry no better on rx for MAI as off  Sleep: fine Cp R flank x 6-7 years / hurts to sleep on side/ "not really pain" / not pleuritic / no change with eating/ urine   No obvious day to day or daytime variability or assoc excess/ purulent sputum or mucus plugs or hemoptysis or   chest tightness, subjective wheeze or overt sinus or hb symptoms. No unusual exposure hx or h/o childhood pna/ asthma or knowledge of premature birth.  Sleeping ok flat without nocturnal  or early am exacerbation  of respiratory  c/o's or need for noct saba. Also denies any obvious fluctuation of symptoms with weather or environmental changes or other aggravating or alleviating factors except as outlined above   Current Allergies, Complete Past Medical  History, Past Surgical History, Family History, and Social History were reviewed in [employer] record.  ROS  The following are not active complaints unless bolded Hoarseness, sore throat, dysphagia, dental problems, itching, sneezing,  nasal congestion or discharge of excess mucus or purulent secretions, ear ache,   fever, chills, sweats, unintended wt loss or wt gain, classically pleuritic or exertional cp,  orthopnea pnd or leg swelling, presyncope, palpitations, abdominal pain, anorexia, nausea, vomiting, diarrhea  or change in bowel habits or change in bladder habits, change in stools or change in urine, dysuria, hematuria,  rash, arthralgias, visual complaints, headache, numbness, weakness or ataxia or problems with walking or coordination,  change in mood/affect or memory.        Current Meds  Medication Sig  . aspirin 81 MG EC tablet Take 1 tablet (81 mg total) by mouth daily.                   Objective:   Physical Exam   amb latino    Wt Readings from Last 3 Encounters:  12/07/17 177 lb 6.4 oz (80.5 kg)  06/22/17 181 lb 9.6 oz (82.4 kg)  12/01/16 181 lb (82.1 kg)     Vital signs reviewed - Note on arrival 02 sats  93% on RA     HEENT: nl dentition,  turbinates bilaterally, and oropharynx. Nl external ear canals without cough reflex   NECK :  without JVD/Nodes/TM/ nl carotid upstrokes bilaterally   LUNGS: no acc muscle use,  Nl contour chest which is clear to A and P bilaterally without cough on insp or exp maneuvers   CV:  RRR  no s3 or murmur or increase in P2, and no edema   ABD:  soft and nontender with nl inspiratory excursion in the supine position. No bruits or organomegaly appreciated, bowel sounds nl  MS:  Nl gait/ ext warm without deformities, calf tenderness, cyanosis or clubbing No obvious joint restrictions   SKIN: warm and dry without lesions    NEURO:  alert, approp, nl sensorium with  no motor or cerebellar deficits  apparent.       Studies reviewed 12/07/2017     PFT 12/21/15:  FVC 2.31 L (91%) FEV1 1.78 L (94%) FEV1/FVC 0.77 FEF 25-75 1.63 L (131%) no bronchodilator response TLC 10.16 L (204%) RV 329% ERV 131% (patient unable to perform DLCO)   PATHOLOGY RUL BAL (11/05/15): No malignancy.  MICROBIOLOGY: RUL BAL (1/9) AFB: Smear Negative / Ctx negative Fungal: Smear Negative / Ctx negative Routine: Normal oral flora  Sputum AFB (11/02/15): Smear Negative / MAC on culture  Quantiferon-TB (11/02/15): Negative Blood Ctx x2 (11/02/15): Negative         I personally reviewed images and agree with radiology impression as follows:   Chest CT  07/09/17  Stable appearance of soft tissue opacities at the apices bilaterally, with finding at RIGHT apex again demonstrating central necrosis/fluid. Underlying emphysematous and bronchitic changes. Biapical findings are most likely postinflammatory in origin. Hepatic cysts. No new intrathoracic abnormalities.     CXR PA and Lateral:   12/07/2017 :    I personally reviewed images and agree with radiology impression as follows:    The appearance of the chest has not significantly changed since the previous studies. There remain abnormalities in the apices. Soft tissue density at the left lung base persists as well.    Assessment & Plan:

## 2017-12-07 NOTE — Assessment & Plan Note (Addendum)
This is an extremely common benign condition in the elderly and does not warrant aggressive eval/ rx at this point unless there is a clinical correlation suggesting unaddressed pulmonary infection (purulent sputum, night sweats, unintended wt loss, doe) or evolution of  obvious changes on plain cxr (as opposed to serial CT, which is way over sensitive to make clinical decisions re intervention and treatment in the elderly, who tend to tolerate both dx and treatment poorly) .     I had an extended discussion with the patient reviewing all relevant studies completed to date and  lasting 15 to 20 minutes of a 25 minute visit on the following ongoing concerns:   Discussed in detail all the  indications, usual  risks and alternatives  relative to the benefits with patient/daughter who agree  to proceed with conservative f/u as outlined     Each maintenance medication was reviewed in detail including most importantly the difference between maintenance and prns and under what circumstances the prns are to be triggered using an action plan format that is not reflected in the computer generated alphabetically organized AVS.    Please see AVS for specific instructions unique to this visit that I personally wrote and verbalized to the the pt in detail and then reviewed with pt  by my nurse highlighting any  changes in therapy recommended at today's visit to their plan of care.     F/u in 6 m , sooner prn

## 2017-12-07 NOTE — Progress Notes (Signed)
LMTCB for [place], pt's granddaughter

## 2017-12-08 ENCOUNTER — Encounter: Payer: Self-pay | Admitting: Internal Medicine

## 2017-12-08 NOTE — Progress Notes (Signed)
LMTCB for patient's granddaughter [place]

## 2017-12-09 NOTE — Progress Notes (Signed)
Called spoke with patient's granddaughter Selbe, advised of cxr results / recs as stated by MW.  [name] verbalized her understanding and denied any questions.

## 2018-03-26 IMAGING — CT CT CHEST W/O CM
2 of 3 series · 15 of 36 positions shown, 18 images · non-contrast
Comparison: CT 11/02/2015.  PET-CT 11/28/2015

CLINICAL DATA: Follow up pulmonary nodule. History of atypical
mycobacterial infection. No malignant cells demonstrated on
bronchial lavage on 11/05/2015.

EXAM:
CT CHEST WITHOUT CONTRAST
TECHNIQUE: Multidetector CT imaging of the chest was performed following the
standard protocol without IV contrast.

[Series 2: thorax · axial · 0.73mm/px · z∈[-266,-4]mm · 12 of 155 slices shown, 15 images]
[im 12/155  mediastinal]
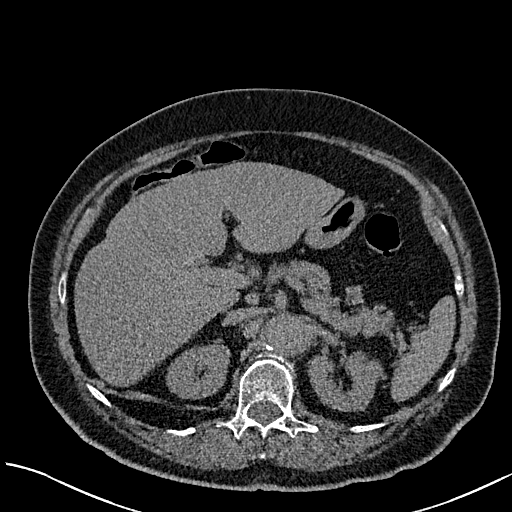
[im 12/155  lung]
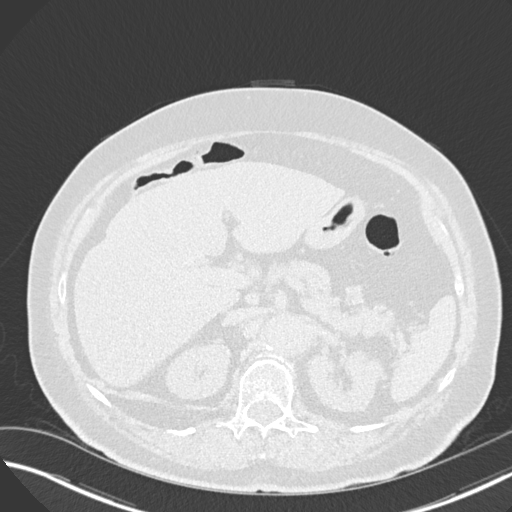
[im 23/155  lung]
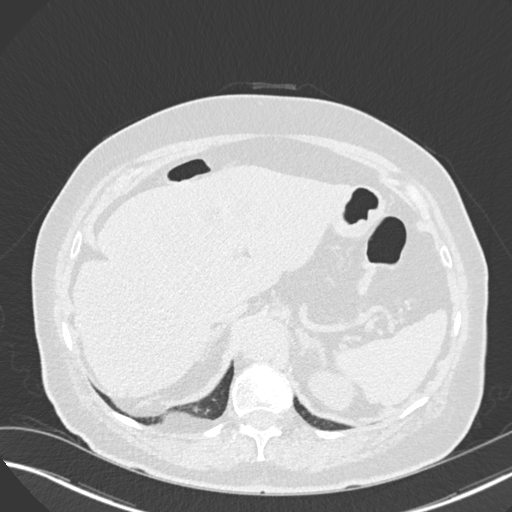
[im 35/155  lung]
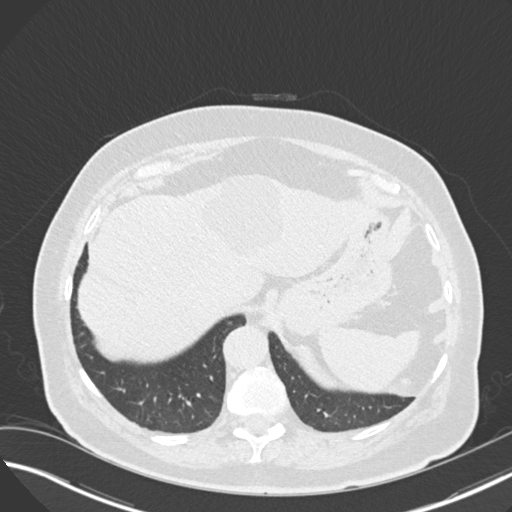
[im 46/155  lung]
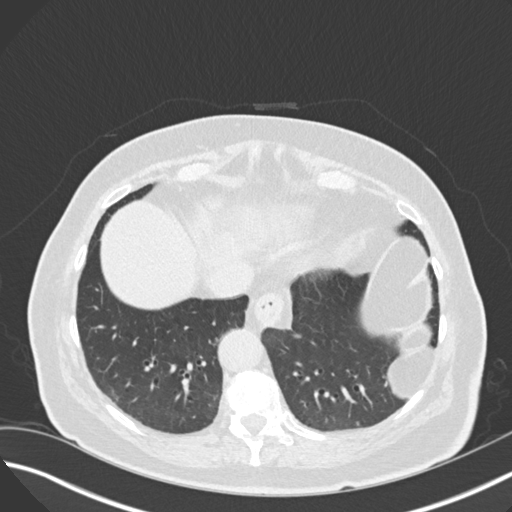
[im 58/155  mediastinal]
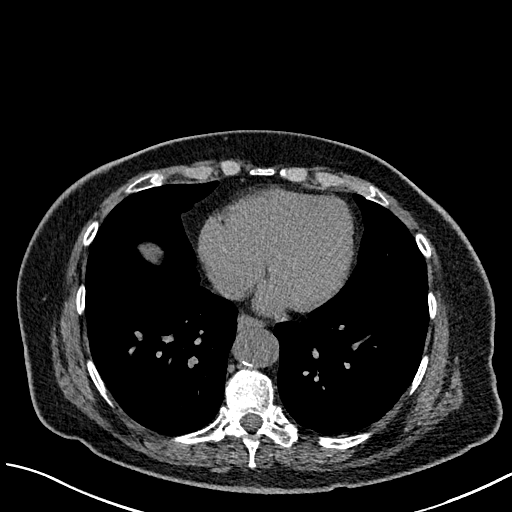
[im 58/155  lung]
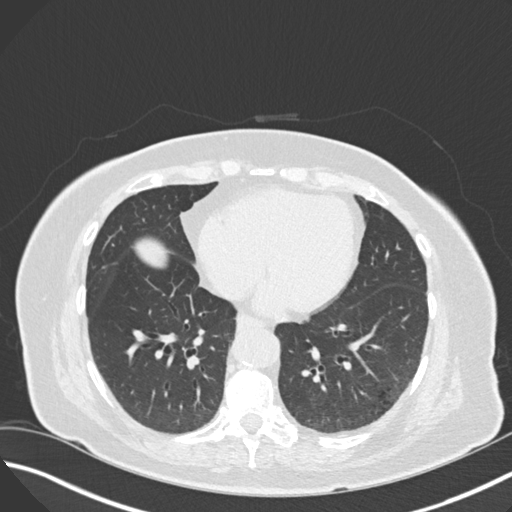
[im 69/155  lung]
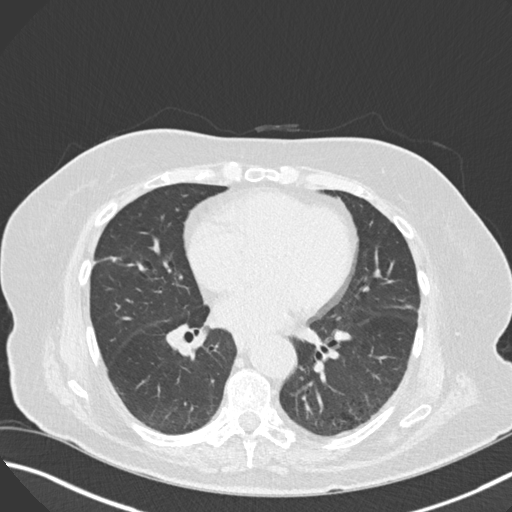
[im 86/155  lung]
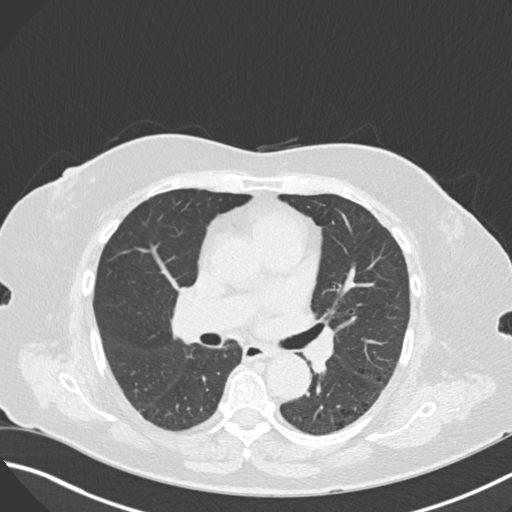
[im 97/155  lung]
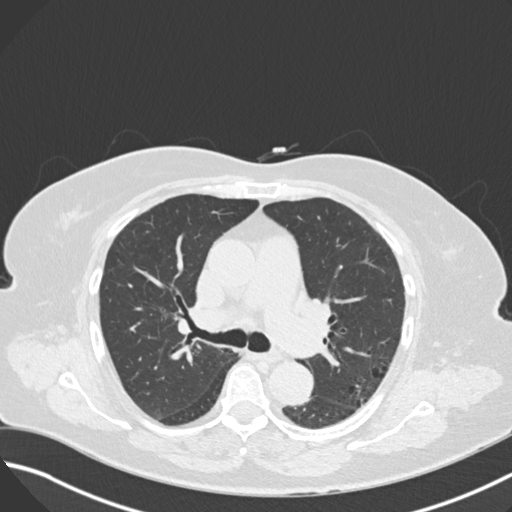
[im 109/155  mediastinal]
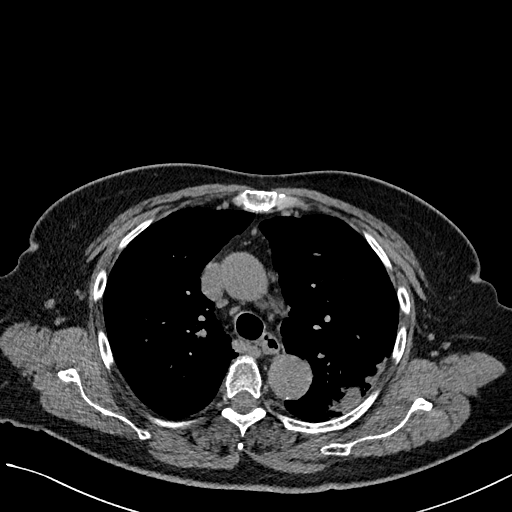
[im 109/155  lung]
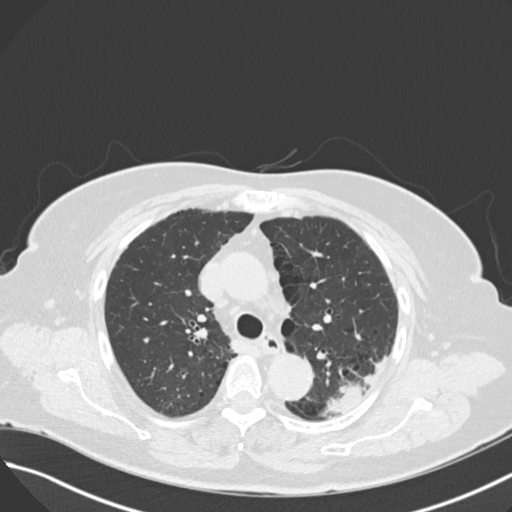
[im 120/155  lung]
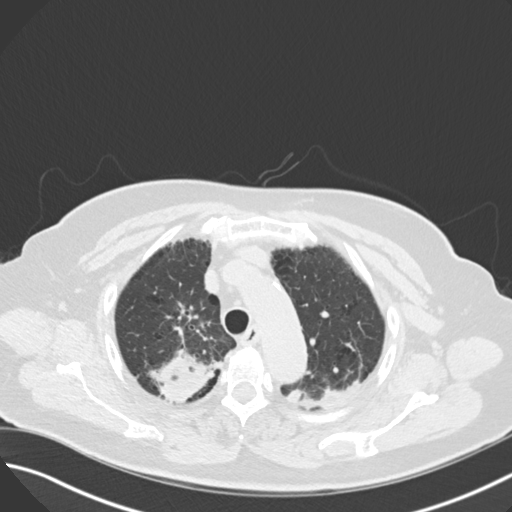
[im 132/155  lung]
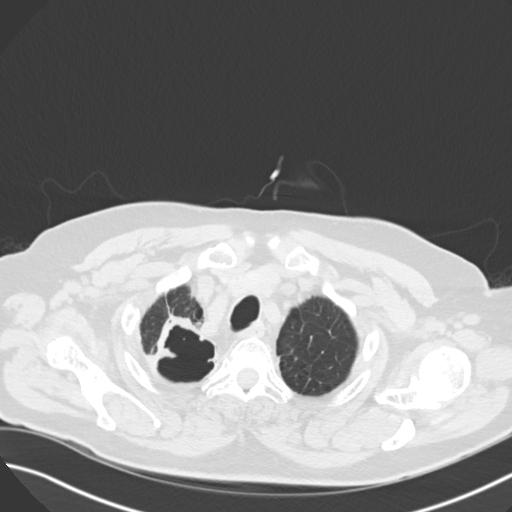
[im 143/155  lung]
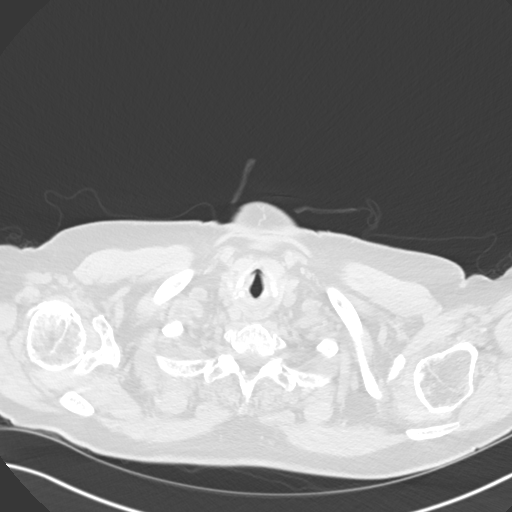

[Series 5: coronal · coronal · 0.60mm/px · 3 of 116 slices shown]
[im 24/116  lung]
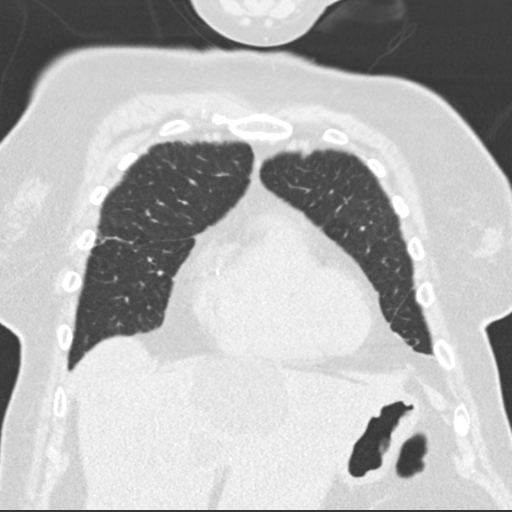
[im 47/116  lung]
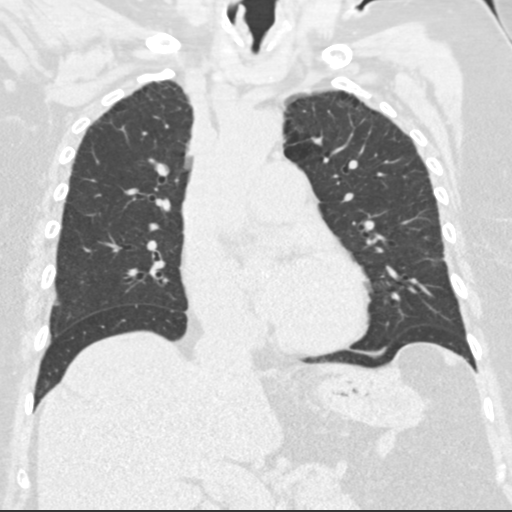
[im 70/116  lung]
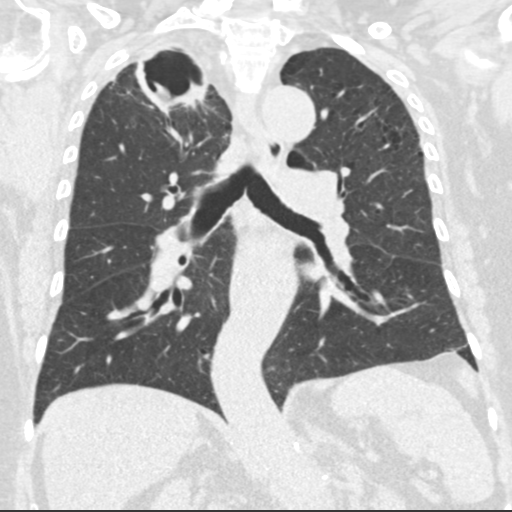

[15 of 36 positions shown; findings below may reference images not displayed]

FINDINGS: Mediastinum/Nodes: There is a stable 13 mm low right paratracheal
node on image 57. No other enlarged mediastinal, hilar or axillary
lymph nodes are identified. Hilar assessment is limited by the lack
of intravenous contrast, although the hilar contours appear
unchanged. The thyroid gland appears normal. There is a stable small
hiatal hernia. The heart size is normal. There is no pericardial
effusion. Mild atherosclerosis of the aorta, great vessels and
coronary arteries.

Lungs/Pleura: There is no pleural effusion. Stable changes of upper
lobe predominant centrilobular and paraseptal emphysema with
bronchial wall thickening and superior hilar retraction.
Thick-walled right upper lobe cavitary mass is unchanged in overall
size, measuring 5.7 x 5.7 cm on image 28. There is now a small
amount of fluid in the cavitary component. The irregular subpleural
nodular densities posteriorly in the left upper lobe also appear
unchanged, measuring up to 2.1 x 2.6 cm on axial image 46. The
stability is best seen on the sagittal images. No new or enlarging
pulmonary nodules identified.

Upper abdomen: The upper abdomen has a stable appearance with
hepatic steatosis and cysts. No adrenal mass.

Musculoskeletal/Chest wall: There is no chest wall mass or
suspicious osseous finding.
IMPRESSION: 1. Stable overall size of large cavitary right upper lobe lesion,
now containing a small amount of fluid.
2. The irregular confluent subpleural nodularity posteriorly in the
left upper lobe is also stable.
3. No acute findings or progressive adenopathy.
4. Stable appearance the visualized upper abdomen.
5. Continued CT follow-up in 3-6 months recommended.

## 2018-06-07 ENCOUNTER — Ambulatory Visit: Payer: Medicaid Other | Admitting: Internal Medicine

## 2018-10-11 ENCOUNTER — Ambulatory Visit: Payer: Medicaid Other | Admitting: Cardiovascular Disease

## 2018-10-11 NOTE — Progress Notes (Deleted)
No chief complaint on file.   History of Present Illness:84 yo female with history of COPD, GERD, hyperlipidemia, HTN and SVT who is here today for cardiac follow up. She had been followed in the past by Dr. Mare Ferrari. She had SVT in 2017 treated with adenosine. Echo in 2017 with normal LV systolic function. She was last seen in our office in February 2017 by Cecilie Kicks, NP. I am meeting her for the first time today. She has been followed by Dr. 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   She is here today for follow up. The patient denies any chest pain, dyspnea, palpitations, lower extremity edema, orthopnea, PND, dizziness, near syncope or syncope.    Primary Care Physician: Benito Mccreedy, MD  Past Medical History:  Diagnosis Date  . COPD (chronic obstructive pulmonary disease) (HCC)    slight   . GERD (gastroesophageal reflux disease)   . High cholesterol   . Hypertension    patient states b/p up and down not on meds  . Kidney stones     Past Surgical History:  Procedure Laterality Date  . ABDOMINAL HYSTERECTOMY    . CATARACT EXTRACTION    . HERNIA REPAIR    . INCISIONAL HERNIA REPAIR N/A 07/18/2014   Procedure: REPAIR OF INACERATED INCISIONAL HERNIA WITH MESH;  Surgeon: Armandina Gemma, MD;  Location: [facility];  Service: General;  Laterality: N/A;  . INSERTION OF MESH N/A 07/18/2014   Procedure: INSERTION OF MESH;  Surgeon: Armandina Gemma, MD;  Location: [facility];  Service: General;  Laterality: N/A;  . KNEE SURGERY    . VIDEO BRONCHOSCOPY Bilateral 11/05/2015   Procedure: VIDEO BRONCHOSCOPY WITHOUT FLUORO;  Surgeon: Javier Glazier, MD;  Location: [facility];  Service: Cardiopulmonary;  Laterality: Bilateral;    Current Outpatient Medications  Medication Sig Dispense Refill  . aspirin 81 MG EC tablet Take 1 tablet (81 mg total) by mouth daily. 30 tablet 11   No current facility-administered medications for this  visit.     Allergies  Allergen Reactions  . Nabumetone Itching and Swelling    Social History   Socioeconomic History  . Marital status: Widowed    Spouse name: Not on file  . Number of children: Not on file  . Years of education: Not on file  . Highest education level: Not on file  Occupational History  . Not on file  Social Needs  . Financial resource strain: Not on file  . Food insecurity:    Worry: Not on file    Inability: Not on file  . Transportation needs:    Medical: Not on file    Non-medical: Not on file  Tobacco Use  . Smoking status: Former Smoker    Packs/day: 0.50    Years: 60.00    Pack years: 30.00    Types: Cigarettes    Last attempt to quit: 10/27/2006    Years since quitting: 11.9  . Smokeless tobacco: Never Used  Substance and Sexual Activity  . Alcohol use: No    Alcohol/week: 0.0 standard drinks  . Drug use: No  . Sexual activity: Not on file  Lifestyle  . Physical activity:    Days per week: Not on file    Minutes per session: Not on file  . Stress: Not on file  Relationships  . Social connections:    Talks on phone: Not on file    Gets together: Not on file    Attends  religious service: Not on file    Active member of club or organization: Not on file    Attends meetings of clubs or organizations: Not on file    Relationship status: Not on file  . Intimate partner violence:    Fear of current or ex partner: Not on file    Emotionally abused: Not on file    Physically abused: Not on file    Forced sexual activity: Not on file  Other Topics Concern  . Not on file  Social History Narrative  . Not on file    No family history on file.  Review of Systems:  As stated in the HPI and otherwise negative.   There were no vitals taken for this visit.  Physical Examination: General: Well developed, well nourished, NAD  HEENT: OP clear, mucus membranes moist  SKIN: warm, dry. No rashes. Neuro: No focal deficits  Musculoskeletal:  Muscle strength 5/5 all ext  Psychiatric: Mood and affect normal  Neck: No JVD, no carotid bruits, no thyromegaly, no lymphadenopathy.  Lungs:Clear bilaterally, no wheezes, rhonci, crackles Cardiovascular: Regular rate and rhythm. No murmurs, gallops or rubs. Abdomen:Soft. Bowel sounds present. Non-tender.  Extremities: No lower extremity edema. Pulses are 2 + in the bilateral DP/PT.  EKG:  EKG {ACTION; IS/IS [id number]} ordered today. The ekg ordered today demonstrates ***  Recent Labs: No results found for requested labs within last 8760 hours.   Lipid Panel    Component Value Date/Time   CHOL 247 (H) 01/04/2016 1032   TRIG 158 (H) 01/04/2016 1032   HDL 42 (L) 01/04/2016 1032   CHOLHDL 5.9 (H) 01/04/2016 1032   VLDL 32 (H) 01/04/2016 1032   LDLCALC 173 (H) 01/04/2016 1032     Wt Readings from Last 3 Encounters:  12/07/17 177 lb 6.4 oz (80.5 kg)  06/22/17 181 lb 9.6 oz (82.4 kg)  12/01/16 181 lb (82.1 kg)     Other studies Reviewed: Additional studies/ records that were reviewed today include: ***. Review of the above records demonstrates: ***   Assessment and Plan:   1. SVT: ***  Current medicines are reviewed at length with the patient today.  The patient {ACTIONS; HAS/DOES NOT HAVE:19233} concerns regarding medicines.  The following changes have been made:  {PLAN; NO CHANGE:13088:s}  Labs/ tests ordered today include: *** No orders of the defined types were placed in this encounter.    Disposition:   FU with *** in {gen number [id number]} {TIME; UNITS DAY/WEEK/MONTH:19136}   Signed, Lauree Chandler, MD 10/11/2018 1:28 PM    [facility] [address], [place], [place]  [address] Phone: [phone]; Fax: [phone]

## 2018-10-13 ENCOUNTER — Encounter: Payer: Self-pay | Admitting: Cardiovascular Disease

## 2019-04-21 ENCOUNTER — Telehealth: Payer: Self-pay | Admitting: Cardiovascular Disease

## 2019-04-21 ENCOUNTER — Telehealth: Payer: Self-pay

## 2019-04-21 NOTE — Telephone Encounter (Signed)
I spoke with the patient's daughter who called because the patient has been experiencing SOB, dizziness and palpitations.  I scheduled an OV 6/26 with Dayna.      COVID-19 Pre-Screening Questions:  . In the past 7 to 10 days have you had a cough,  shortness of breath, headache, congestion, fever (100 or greater) body aches, chills, sore throat, or sudden loss of taste or sense of smell?  no . Have you been around anyone with known Covid 19 no . Have you been around anyone who is awaiting Covid 19 test results in the past 7 to 10 days?  no . Have you been around anyone who has been exposed to Covid 19, or has mentioned symptoms of Covid 19 within the past 7 to 10 days?  no  If you have any concerns/questions about symptoms patients report during screening (either on the phone or at threshold). Contact the provider seeing the patient or DOD for further guidance.  If neither are available contact a member of the leadership team.

## 2019-04-21 NOTE — Telephone Encounter (Signed)
I spoke to the patient's grand daughter and informed her that we were cancelling the patient's appointment on 6/26 with Dayna, because the patient hasn't been seen in over 2 years and is now considered a new patient and needs to be seen by MD.  I told her that I would have Dr Camillia Herter nurse reach out to her tomorrow to advise.

## 2019-04-21 NOTE — Telephone Encounter (Signed)
New message   Patient c/o Palpitations:  High priority if patient c/o lightheadedness, shortness of breath, or chest pain  How long have you had palpitations/irregular HR/ Afib? Are you having the symptoms now? Per Anderson Malta has been having palpitations for about a month, no  1) Are you currently experiencing lightheadedness, SOB or CP? SOB, lightheaded   2) Do you have a history of afib (atrial fibrillation) or irregular heart rhythm?yes   3) Have you checked your BP or HR? (document readings if available): no   4) Are you experiencing any other symptoms? No

## 2019-04-22 ENCOUNTER — Ambulatory Visit: Payer: Medicaid Other | Admitting: Physician Assistant

## 2019-04-22 NOTE — Telephone Encounter (Signed)
I spoke with pt's grand daughter. She is at another appointment and will call us back

## 2019-04-25 NOTE — Telephone Encounter (Signed)
    COVID-19 Pre-Screening Questions:  . In the past 7 to 10 days have you had a cough,  shortness of breath, headache, congestion, fever (100 or greater) body aches, chills, sore throat, or sudden loss of taste or sense of smell?  Short of breath when having palpitations.  No to other questions.  . Have you been around anyone with known Covid 19.-no . Have you been around anyone who is awaiting Covid 19 test results in the past 7 to 10 days? no . Have you been around anyone who has been exposed to Covid 19, or has mentioned symptoms of Covid 19 within the past 7 to 10 days? no  If you have any concerns/questions about symptoms patients report during screening (either on the phone or at threshold). Contact the provider seeing the patient or DOD for further guidance.  If neither are available contact a member of the leadership team.      I spoke with pt's daughter who answered above questions.  Daughter aware pt should arrive 15 minutes prior to appointment and wear mask to appointment.  Daughter aware no one can accompany pt to appointment.  I told daughter office could contact her by phone during appointment.

## 2019-04-25 NOTE — Telephone Encounter (Signed)
Appointment time is July 2,2020 at 3:00 with Dr. Angelena Form

## 2019-04-28 ENCOUNTER — Telehealth: Payer: Self-pay | Admitting: Radiology

## 2019-04-28 ENCOUNTER — Ambulatory Visit (INDEPENDENT_AMBULATORY_CARE_PROVIDER_SITE_OTHER): Payer: Medicaid Other | Admitting: Cardiovascular Disease

## 2019-04-28 ENCOUNTER — Encounter: Payer: Self-pay | Admitting: Cardiovascular Disease

## 2019-04-28 ENCOUNTER — Other Ambulatory Visit: Payer: Self-pay

## 2019-04-28 VITALS — BP 120/70 | HR 85 | Ht 65.0 in | Wt 187.2 lb

## 2019-04-28 DIAGNOSIS — I471 Supraventricular tachycardia: Secondary | ICD-10-CM | POA: Diagnosis not present

## 2019-04-28 MED ORDER — METOPROLOL SUCCINATE ER 50 MG PO TB24: 50.0000 mg | ORAL_TABLET | Freq: Every day | ORAL | 11 refills | Status: DC

## 2019-04-28 NOTE — Progress Notes (Signed)
Chief Complaint  Patient presents with  . New Patient (Initial Visit)    palpitations   History of Present Illness:85 yo female with history of SVT, sleep apnea, GERD, HLD, HTN and kidney stones here today as a new patient for evaluation of palpitations. She had been seen by Dr. Mare Ferrari while admitted to [facility] in January 2017. She has only been seen in our office once in February 2017 by Cecilie Kicks, NP. She was admitted to [facility] in January 2017 with SVT treated with adenosine. Echo January 2017 with normal LV size and function. She was started on a beta blocker.   She tells me today that she has had dizziness with feelings of palpitations. The palpitations occur once per month. The episodes last up to an hour. She feels nauseous and dizzy when this happens. No chest pain or dyspnea. No LE edema. Echo in primary care office per granddaughter within last 6 months.   Primary Care Physician: Benito Mccreedy, MD   Past Medical History:  Diagnosis Date  . COPD (chronic obstructive pulmonary disease) (HCC)    slight   . GERD (gastroesophageal reflux disease)   . High cholesterol   . Hypertension    patient states b/p up and down not on meds  . Kidney stones   . SVT (supraventricular tachycardia) (HCC)     Past Surgical History:  Procedure Laterality Date  . ABDOMINAL HYSTERECTOMY    . CATARACT EXTRACTION    . HERNIA REPAIR    . INCISIONAL HERNIA REPAIR N/A 07/18/2014   Procedure: REPAIR OF INACERATED INCISIONAL HERNIA WITH MESH;  Surgeon: Armandina Gemma, MD;  Location: [facility];  Service: General;  Laterality: N/A;  . INSERTION OF MESH N/A 07/18/2014   Procedure: INSERTION OF MESH;  Surgeon: Armandina Gemma, MD;  Location: [facility];  Service: General;  Laterality: N/A;  . KNEE SURGERY    . VIDEO BRONCHOSCOPY Bilateral 11/05/2015   Procedure: VIDEO BRONCHOSCOPY WITHOUT FLUORO;  Surgeon: Javier Glazier, MD;  Location: [facility];  Service: Cardiopulmonary;  Laterality: Bilateral;     Current Outpatient Medications  Medication Sig Dispense Refill  . aspirin 81 MG EC tablet Take 1 tablet (81 mg total) by mouth daily. 30 tablet 11  . gabapentin (NEURONTIN) 300 MG capsule Take 1 mg by mouth daily.    . metoprolol succinate (TOPROL-XL) 50 MG 24 hr tablet Take 1 tablet (50 mg total) by mouth daily. 30 tablet 11  . omeprazole (PRILOSEC) 20 MG capsule Take 1 mg by mouth daily.     No current facility-administered medications for this visit.     Allergies  Allergen Reactions  . Nabumetone Itching and Swelling    Social History   Socioeconomic History  . Marital status: Widowed    Spouse name: Not on file  . Number of children: Not on file  . Years of education: Not on file  . Highest education level: Not on file  Occupational History  . Not on file  Social Needs  . Financial resource strain: Not on file  . Food insecurity    Worry: Not on file    Inability: Not on file  . Transportation needs    Medical: Not on file    Non-medical: Not on file  Tobacco Use  . Smoking status: Former Smoker    Packs/day: 0.50    Years: 60.00    Pack years: 30.00    Types: Cigarettes    Quit date: 10/27/2006    Years since  quitting: 12.5  . Smokeless tobacco: Never Used  Substance and Sexual Activity  . Alcohol use: No    Alcohol/week: 0.0 standard drinks  . Drug use: No  . Sexual activity: Not on file  Lifestyle  . Physical activity    Days per week: Not on file    Minutes per session: Not on file  . Stress: Not on file  Relationships  . Social Herbalist on phone: Not on file    Gets together: Not on file    Attends religious service: Not on file    Active member of club or organization: Not on file    Attends meetings of clubs or organizations: Not on file    Relationship status: Not on file  . Intimate partner violence    Fear of current or ex partner: Not on file    Emotionally abused: Not on file    Physically abused: Not on file    Forced sexual  activity: Not on file  Other Topics Concern  . Not on file  Social History Narrative  . Not on file    Family History  Problem Relation Age of Onset  . Diabetes Mother     Review of Systems:  As stated in the HPI and otherwise negative.   BP 120/70   Pulse 85   Ht 5\' 5"  (1.651 m)   Wt 187 lb 3.2 oz (84.9 kg)   SpO2 95%   BMI 31.15 kg/m   Physical Examination: General: Well developed, well nourished, NAD  HEENT: OP clear, mucus membranes moist  SKIN: warm, dry. No rashes. Neuro: No focal deficits  Musculoskeletal: Muscle strength 5/5 all ext  Psychiatric: Mood and affect normal  Neck: No JVD, no carotid bruits, no thyromegaly, no lymphadenopathy.  Lungs:Clear bilaterally, no wheezes, rhonci, crackles Cardiovascular: Regular rate and rhythm. No murmurs, gallops or rubs. Abdomen:Soft. Bowel sounds present. Non-tender.  Extremities: No lower extremity edema. Pulses are 2 + in the bilateral DP/PT.  EKG:  EKG is ordered today. The ekg ordered today demonstrates NSR, rate 85 bpm. Non-specific T wave abn  Recent Labs: No results found for requested labs within last 8760 hours.   Lipid Panel    Component Value Date/Time   CHOL 247 (H) 01/04/2016 1032   TRIG 158 (H) 01/04/2016 1032   HDL 42 (L) 01/04/2016 1032   CHOLHDL 5.9 (H) 01/04/2016 1032   VLDL 32 (H) 01/04/2016 1032   LDLCALC 173 (H) 01/04/2016 1032     Wt Readings from Last 3 Encounters:  04/28/19 187 lb 3.2 oz (84.9 kg)  12/07/17 177 lb 6.4 oz (80.5 kg)  06/22/17 181 lb 9.6 oz (82.4 kg)     Other studies Reviewed: Additional studies/ records that were reviewed today include: old records, EKG Review of the above records demonstrates:    Assessment and Plan:   1. Palpitations, history of SVT: Sinus today. She has a history of SVT. Her palpitations are concerning for recurrent arrhythmia. Will check TSH and BMET. Increase Toprol from 25 mg dialy to 50 mg daily. Will arrange 30 day event monitor. Her  granddaughter will fax over recent echo report from the primary care office. She works for Dr. Vista Lawman  Current medicines are reviewed at length with the patient today.  The patient does not have concerns regarding medicines.  The following changes have been made:  no change  Labs/ tests ordered today include:   Orders Placed This Encounter  Procedures  .  Basic metabolic panel  . TSH  . CARDIAC EVENT MONITOR  . EKG 12-Lead     Disposition:   FU with care team APP in  2-3 months.    Signed, Lauree Chandler, MD 04/28/2019 3:29 PM    [facility] [address] [place], [place], [place]  [address] Phone: [phone]; Fax: [phone]

## 2019-04-28 NOTE — Patient Instructions (Signed)
Medication Instructions:  1) INCREASE TOPROL to 50 mg daily If you need a refill on your cardiac medications before your next appointment, please call your pharmacy.   Lab work: TODAY! BMET, TSH If you have labs (blood work) drawn today and your tests are completely normal, you will receive your results only by: Marland Kitchen MyChart Message (if you have MyChart) OR . A paper copy in the mail If you have any lab test that is abnormal or we need to change your treatment, we will call you to review the results.  Testing/Procedures: Your physician has recommended that you wear an event monitor. Event monitors are medical devices that record the heart's electrical activity. Doctors most often [place] these monitors to diagnose arrhythmias. Arrhythmias are problems with the speed or rhythm of the heartbeat. The monitor is a small, portable device. You can wear one while you do your normal daily activities. This is usually used to diagnose what is causing palpitations/syncope (passing out).    Follow-Up: At [facility], you and your health needs are our priority.  As part of our continuing mission to provide you with exceptional heart care, we have created designated Provider Care Teams.  These Care Teams include your primary Cardiologist (physician) and Advanced Practice Providers (APPs -  Physician Assistants and Nurse Practitioners) who all work together to provide you with the care you need, when you need it. You will need a follow up appointment in 2 months.  Please call our office 2 months in advance to schedule this appointment.  You may see Lauree Chandler, MD or one of the following Advanced Practice Providers on your designated Care Team:   Kin[place], PA-C Melina Copa, PA-C . Ermalinda Barrios, PA-C

## 2019-04-28 NOTE — Telephone Encounter (Signed)
Enrolled patient for a 30 day [employer] Event Monitor to be mailed. Brief instructions were gone over with the patient and her grand  Daughter. They know to expect the monitor to arrive in 3-4 days

## 2019-04-29 LAB — BASIC METABOLIC PANEL
BUN/Creatinine Ratio: 22 (ref 12–28)
BUN: 19 mg/dL (ref 8–27)
CO2: 20 mmol/L (ref 20–29)
Calcium: 9.8 mg/dL (ref 8.7–10.3)
Chloride: 105 mmol/L (ref 96–106)
Creatinine, Ser: 0.86 mg/dL (ref 0.57–1.00)
GFR calc Af Amer: 70 mL/min/{1.73_m2} (ref >59)
GFR calc non Af Amer: 61 mL/min/{1.73_m2} (ref >59)
Glucose: 108 mg/dL — ABNORMAL HIGH (ref 65–99)
Potassium: 4.1 mmol/L (ref 3.5–5.2)
Sodium: 143 mmol/L (ref 134–144)

## 2019-04-29 LAB — TSH: TSH: 0.928 u[IU]/mL (ref 0.450–4.500)

## 2019-05-02 ENCOUNTER — Telehealth: Payer: Self-pay | Admitting: *Deleted

## 2019-05-02 NOTE — Telephone Encounter (Signed)
I placed call to pt and left message to call office.  

## 2019-05-02 NOTE — Telephone Encounter (Signed)
-----   Message from Burnell Blanks, MD sent at 04/29/2019  7:46 AM EDT ----- TSH and electrolytes ok. Can we let her know? Thanks, chris

## 2019-05-03 NOTE — Telephone Encounter (Signed)
New Message    Granddaughter [place] returning your call states it's ok to leave the results on her voicemail and she will relay the message to her grandmother when she get's off work.

## 2019-05-04 ENCOUNTER — Encounter (INDEPENDENT_AMBULATORY_CARE_PROVIDER_SITE_OTHER): Payer: Medicaid Other

## 2019-05-04 DIAGNOSIS — I471 Supraventricular tachycardia: Secondary | ICD-10-CM

## 2019-05-04 NOTE — Telephone Encounter (Signed)
I reviewed results with pt's grand daughter.

## 2019-05-16 ENCOUNTER — Telehealth: Payer: Self-pay | Admitting: Physician Assistant

## 2019-05-16 ENCOUNTER — Encounter: Payer: Self-pay | Admitting: Cardiovascular Disease

## 2019-05-16 NOTE — Telephone Encounter (Signed)
Paged from preventive service. Patient had episode of atrial flutter at 3:30pm central time for about 1 minutes. Asymptomatic per representative. Will fax strip for review.

## 2019-05-17 NOTE — Telephone Encounter (Signed)
I reviewed the strips. Agree that it looks like atrial flutter. Can we check with the patient and make sure she feels ok? I will let her finish the monitor before making any changes. Gerald Stabs

## 2019-05-17 NOTE — Telephone Encounter (Signed)
Strip reviewed by DOD. Will have strips scanned in for review by ordering.

## 2019-05-17 NOTE — Telephone Encounter (Signed)
Thanks

## 2019-06-19 ENCOUNTER — Ambulatory Visit (HOSPITAL_COMMUNITY)
Admission: EM | Admit: 2019-06-19 | Discharge: 2019-06-19 | Disposition: A | Discharge status: Home / Self Care | Payer: Medicaid Other | Attending: Family Medicine | Admitting: Family Medicine

## 2019-06-19 ENCOUNTER — Other Ambulatory Visit: Payer: Self-pay

## 2019-06-19 ENCOUNTER — Encounter (HOSPITAL_COMMUNITY): Payer: Self-pay

## 2019-06-19 DIAGNOSIS — R109 Unspecified abdominal pain: Secondary | ICD-10-CM

## 2019-06-19 LAB — POCT URINALYSIS DIP (DEVICE)
Bilirubin Urine: NEGATIVE
Glucose, UA: NEGATIVE mg/dL
Ketones, ur: NEGATIVE mg/dL
Nitrite: NEGATIVE
Protein, ur: NEGATIVE mg/dL
Specific Gravity, Urine: 1.02 (ref 1.005–1.030)
Urobilinogen, UA: 0.2 mg/dL (ref 0.0–1.0)
pH: 6.5 (ref 5.0–8.0)

## 2019-06-19 MED ORDER — METHYLPREDNISOLONE ACETATE 40 MG/ML IJ SUSP
INTRAMUSCULAR | Status: AC
  Filled 2019-06-19: qty 1

## 2019-06-19 MED ORDER — ACYCLOVIR 800 MG PO TABS: 800.0000 mg | ORAL_TABLET | Freq: Every day | ORAL | 0 refills | Status: DC

## 2019-06-19 MED ORDER — METHYLPREDNISOLONE ACETATE 80 MG/ML IJ SUSP
40.0000 mg | Freq: Once | INTRAMUSCULAR | Status: AC
  Administered 2019-06-19: 40 mg via INTRAMUSCULAR

## 2019-06-19 NOTE — ED Triage Notes (Signed)
Pt states she has flank pain x 3 days.

## 2019-06-19 NOTE — Discharge Instructions (Signed)
°  Estoy enviando aciclovir. Pruebe con tylenol. Haga un seguimiento con su mdico de atencin primaria si sus sntomas no mejoran.

## 2019-06-19 NOTE — ED Provider Notes (Signed)
[facility]    CSN: [id number] Arrival date & time: 06/19/19  1343      History   Chief Complaint Chief Complaint  Patient presents with  . Flank Pain    HPI Brooke Mora is a 85 y.o. female.   She is presenting with left-sided flank pain.  The pain is been ongoing for a few days.  She denies a history of nephrolithiasis.  Denies any fevers or chills.  Unsure if she is ever had a shingles vaccine.  The pain is becoming more constant in nature.  Has tried Tylenol with limited no improvement.  Is localized to the left-sided flank region.  Follows along the dermatome on the left side.  Pain is severe in nature.   A Spanish video interpreter was used during the entirety of this visit.  HPI  Past Medical History:  Diagnosis Date  . COPD (chronic obstructive pulmonary disease) (HCC)    slight   . GERD (gastroesophageal reflux disease)   . High cholesterol   . Hypertension    patient states b/p up and down not on meds  . Kidney stones   . SVT (supraventricular tachycardia) [facility])     Patient Active Problem List   Diagnosis Date Noted  . 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 HTN (hypertension) 12/24/2015  . GERD (gastroesophageal reflux disease) 12/24/2015  . Nephrolithiasis 12/24/2015  . COPD exacerbation ([place]) 12/24/2015  . Former cigarette smoker 12/24/2015  . Hyperglycemia 12/24/2015  . Dyslipidemia 12/24/2015  . SVT (supraventricular tachycardia) ([place]) 11/01/2015  . Cavitary lesion of lung 11/01/2015  . Incisional hernia, without obstruction or gangrene, incarcerated 06/12/2014    Past Surgical History:  Procedure Laterality Date  . ABDOMINAL HYSTERECTOMY    . CATARACT EXTRACTION    . HERNIA REPAIR    . INCISIONAL HERNIA REPAIR N/A 07/18/2014   Procedure: REPAIR OF INACERATED INCISIONAL HERNIA WITH MESH;  Surgeon: Armandina Gemma, MD;  Location: [facility];  Service: General;  Laterality: N/A;  . INSERTION OF MESH N/A 07/18/2014   Procedure: INSERTION OF MESH;  Surgeon: Armandina Gemma, MD;  Location: [facility];  Service: General;  Laterality: N/A;  . KNEE SURGERY    . VIDEO BRONCHOSCOPY Bilateral 11/05/2015   Procedure: VIDEO BRONCHOSCOPY WITHOUT FLUORO;  Surgeon: Javier Glazier, MD;  Location: [facility];  Service: Cardiopulmonary;  Laterality: Bilateral;    OB History   No obstetric history on file.      Home Medications    Prior to Admission medications   Medication Sig Start Date End Date Taking? Authorizing Provider  acyclovir (ZOVIRAX) 800 MG tablet Take 1 tablet (800 mg total) by mouth 5 (five) times daily. 06/19/19   Rosemarie Ax, MD  aspirin 81 MG EC tablet Take 1 tablet (81 mg total) by mouth daily. 01/30/16   Michel Bickers, MD  gabapentin (NEURONTIN) 300 MG capsule Take 1 mg by mouth daily. 05/11/18   [provider]  metoprolol succinate (TOPROL-XL) 50 MG 24 hr tablet Take 1 tablet (50 mg total) by mouth daily. 04/28/19 04/22/20  Burnell Blanks, MD  omeprazole (PRILOSEC) 20 MG capsule Take 1 mg by mouth daily. 03/01/19   [provider]    Family History Family History  Problem Relation Age of Onset  . Diabetes Mother     Social History Social History   Tobacco Use  . Smoking status: Former Smoker    Packs/day: 0.50    Years: 60.00    Pack years:  30.00    Types: Cigarettes    Quit date: 10/27/2006    Years since quitting: 12.6  . Smokeless tobacco: Never Used  Substance Use Topics  . Alcohol use: No    Alcohol/week: 0.0 standard drinks  . Drug use: No     Allergies   Nabumetone   Review of Systems Review of Systems  Constitutional: Negative for fever.  HENT: Negative for congestion.   Respiratory: Negative for cough.   Cardiovascular: Negative for chest pain.  Gastrointestinal: Negative for abdominal distention.  Genitourinary: Positive for flank pain.  Musculoskeletal: Positive for back pain.  Skin: Negative for color change.  Hematological: Negative  for adenopathy.  Psychiatric/Behavioral: Negative for agitation.     Physical Exam Triage Vital Signs ED Triage Vitals  Enc Vitals Group     BP 06/19/19 1456 (!) 147/79     Pulse Rate 06/19/19 1456 70     Resp 06/19/19 1456 18     Temp 06/19/19 1456 98.2 F (36.8 C)     Temp Source 06/19/19 1456 Oral     SpO2 06/19/19 1456 98 %     Weight 06/19/19 1459 170 lb (77.1 kg)     Height --      Head Circumference --      Peak Flow --      Pain Score 06/19/19 1459 10     Pain Loc --      Pain Edu? --      Excl. in [place]? --    No data found.  Updated Vital Signs BP (!) 147/79 (BP Location: Right Arm)   Pulse 70   Temp 98.2 F (36.8 C) (Oral)   Resp 18   Wt 77.1 kg   SpO2 98%   BMI 28.29 kg/m   Visual Acuity Right Eye Distance:   Left Eye Distance:   Bilateral Distance:    Right Eye Near:   Left Eye Near:    Bilateral Near:     Physical Exam Gen: NAD, alert, cooperative with exam, well-appearing ENT: normal lips, normal nasal mucosa,  Eye: normal EOM, normal conjunctiva and lids CV:  no edema, +2 pedal pulses   Resp: no accessory muscle use, non-labored,  Skin: no rashes, no areas of induration  Neuro: normal tone, normal sensation to touch Psych:  normal insight, alert and oriented MSK: mild TTP along the left sided flank, some redness in this area. No specific rash  UC Treatments / Results  Labs (all labs ordered are listed, but only abnormal results are displayed) Labs Reviewed  POCT URINALYSIS DIP (DEVICE) - Abnormal; Notable for the following components:      Result Value   Hgb urine dipstick TRACE (*)    Leukocytes,Ua TRACE (*)    All other components within normal limits    EKG   Radiology No results found.  Procedures Procedures (including critical care time)  Medications Ordered in UC Medications  methylPREDNISolone acetate (DEPO-MEDROL) injection 40 mg (40 mg Intramuscular Given 06/19/19 1618)  methylPREDNISolone acetate (DEPO-MEDROL) 40  MG/ML injection (has no administration in time range)    Initial Impression / Assessment and Plan / UC Course  I have reviewed the triage vital signs and the nursing notes.  Pertinent labs & imaging results that were available during my care of the patient were reviewed by me and considered in my medical decision making (see chart for details).     Jamira is an 85 year old female that is presenting with left flank pain.  Most likely symptoms are associated with shingles.  Less likely for nephrolithiasis.  Does not appear to be pyelonephritis.  Urinalysis was showing suggesting an infection or kidney stone. Provided IM depo. Will send in acyclovir. Counseled on supportive care. Given indications to return.   Final Clinical Impressions(s) / UC Diagnoses   Final diagnoses:  Left flank pain     Discharge Instructions      Estoy enviando aciclovir. Pruebe con tylenol. Haga un seguimiento con su mdico de atencin primaria si sus sntomas no mejoran.    ED Prescriptions    Medication Sig Dispense Auth. Provider   acyclovir (ZOVIRAX) 800 MG tablet Take 1 tablet (800 mg total) by mouth 5 (five) times daily. 35 tablet Rosemarie Ax, MD     Controlled Substance Prescriptions [place] Controlled Substance Registry consulted? Not Applicable   Rosemarie Ax, MD 06/19/19 [id number]

## 2019-06-20 ENCOUNTER — Emergency Department (HOSPITAL_BASED_OUTPATIENT_CLINIC_OR_DEPARTMENT_OTHER): Payer: Medicaid Other

## 2019-06-20 ENCOUNTER — Encounter (HOSPITAL_BASED_OUTPATIENT_CLINIC_OR_DEPARTMENT_OTHER): Payer: Self-pay | Admitting: *Deleted

## 2019-06-20 ENCOUNTER — Emergency Department (HOSPITAL_BASED_OUTPATIENT_CLINIC_OR_DEPARTMENT_OTHER)
Admission: EM | Admit: 2019-06-20 | Discharge: 2019-06-20 | Disposition: A | Discharge status: Home / Self Care | Payer: Medicaid Other | Attending: Emergency Medicine | Admitting: Emergency Medicine

## 2019-06-20 ENCOUNTER — Other Ambulatory Visit: Payer: Self-pay

## 2019-06-20 ENCOUNTER — Telehealth: Payer: Self-pay | Admitting: Vascular Surgery

## 2019-06-20 DIAGNOSIS — Z7982 Long term (current) use of aspirin: Secondary | ICD-10-CM | POA: Insufficient documentation

## 2019-06-20 DIAGNOSIS — N3090 Cystitis, unspecified without hematuria: Secondary | ICD-10-CM | POA: Insufficient documentation

## 2019-06-20 DIAGNOSIS — I714 Abdominal aortic aneurysm, without rupture, unspecified: Secondary | ICD-10-CM

## 2019-06-20 DIAGNOSIS — J449 Chronic obstructive pulmonary disease, unspecified: Secondary | ICD-10-CM | POA: Insufficient documentation

## 2019-06-20 DIAGNOSIS — Z87891 Personal history of nicotine dependence: Secondary | ICD-10-CM | POA: Insufficient documentation

## 2019-06-20 DIAGNOSIS — I10 Essential (primary) hypertension: Secondary | ICD-10-CM | POA: Insufficient documentation

## 2019-06-20 DIAGNOSIS — N3000 Acute cystitis without hematuria: Secondary | ICD-10-CM

## 2019-06-20 DIAGNOSIS — Z79899 Other long term (current) drug therapy: Secondary | ICD-10-CM | POA: Diagnosis not present

## 2019-06-20 DIAGNOSIS — R109 Unspecified abdominal pain: Secondary | ICD-10-CM

## 2019-06-20 DIAGNOSIS — R1032 Left lower quadrant pain: Secondary | ICD-10-CM | POA: Diagnosis present

## 2019-06-20 LAB — COMPREHENSIVE METABOLIC PANEL
ALT: 17 U/L (ref 0–44)
AST: 20 U/L (ref 15–41)
Albumin: 4.4 g/dL (ref 3.5–5.0)
Alkaline Phosphatase: 74 U/L (ref 38–126)
Anion gap: 11 (ref 5–15)
BUN: 13 mg/dL (ref 8–23)
CO2: 25 mmol/L (ref 22–32)
Calcium: 8.8 mg/dL — ABNORMAL LOW (ref 8.9–10.3)
Chloride: 103 mmol/L (ref 98–111)
Creatinine, Ser: 0.81 mg/dL (ref 0.44–1.00)
GFR calc Af Amer: >60 mL/min (ref >60)
GFR calc non Af Amer: >60 mL/min (ref >60)
Glucose, Bld: 112 mg/dL — ABNORMAL HIGH (ref 70–99)
Potassium: 3.8 mmol/L (ref 3.5–5.1)
Sodium: 139 mmol/L (ref 135–145)
Total Bilirubin: 0.9 mg/dL (ref 0.3–1.2)
Total Protein: 7.6 g/dL (ref 6.5–8.1)

## 2019-06-20 LAB — CBC
HCT: 47.3 % — ABNORMAL HIGH (ref 36.0–46.0)
Hemoglobin: 15.4 g/dL — ABNORMAL HIGH (ref 12.0–15.0)
MCH: 28.4 pg (ref 26.0–34.0)
MCHC: 32.6 g/dL (ref 30.0–36.0)
MCV: 87.1 fL (ref 80.0–100.0)
Platelets: 225 10*3/uL (ref 150–400)
RBC: 5.43 MIL/uL — ABNORMAL HIGH (ref 3.87–5.11)
RDW: 12.6 % (ref 11.5–15.5)
WBC: 7.5 10*3/uL (ref 4.0–10.5)
nRBC: 0 % (ref 0.0–0.2)

## 2019-06-20 LAB — URINALYSIS, ROUTINE W REFLEX MICROSCOPIC
Bilirubin Urine: NEGATIVE
Glucose, UA: NEGATIVE mg/dL
Ketones, ur: 15 mg/dL — AB
Nitrite: POSITIVE — AB
Protein, ur: 30 mg/dL — AB
Specific Gravity, Urine: 1.015 (ref 1.005–1.030)
pH: 6 (ref 5.0–8.0)

## 2019-06-20 LAB — URINALYSIS, MICROSCOPIC (REFLEX)

## 2019-06-20 LAB — LIPASE, BLOOD: Lipase: 30 U/L (ref 11–51)

## 2019-06-20 MED ORDER — HYDROCODONE-ACETAMINOPHEN 5-325 MG PO TABS: 1.0000 | ORAL_TABLET | Freq: Four times a day (QID) | ORAL | 0 refills | Status: DC | PRN

## 2019-06-20 MED ORDER — SODIUM CHLORIDE 0.9 % IV SOLN
1.0000 g | Freq: Once | INTRAVENOUS | Status: AC
  Administered 2019-06-20: 1 g via INTRAVENOUS
  Filled 2019-06-20: qty 10

## 2019-06-20 MED ORDER — IOHEXOL 300 MG/ML  SOLN
100.0000 mL | Freq: Once | INTRAMUSCULAR | Status: AC | PRN
  Administered 2019-06-20: 17:00:00 100 mL via INTRAVENOUS

## 2019-06-20 MED ORDER — CEPHALEXIN 500 MG PO CAPS: 1000.0000 mg | ORAL_CAPSULE | Freq: Two times a day (BID) | ORAL | 0 refills | Status: DC

## 2019-06-20 MED ORDER — KETOROLAC TROMETHAMINE 30 MG/ML IJ SOLN
15.0000 mg | Freq: Once | INTRAMUSCULAR | Status: AC
  Administered 2019-06-20: 16:00:00 15 mg via INTRAVENOUS
  Filled 2019-06-20: qty 1

## 2019-06-20 MED ORDER — IBUPROFEN 400 MG PO TABS: 400.0000 mg | ORAL_TABLET | Freq: Three times a day (TID) | ORAL | 0 refills | Status: DC | PRN

## 2019-06-20 NOTE — ED Notes (Signed)
After being seen by EDP Pt will be worked up for [employer]. Kidney stone.

## 2019-06-20 NOTE — ED Triage Notes (Signed)
She was seen at [facility] yesterday for pain in her left scapula. She was started on Valtrex yesterday for shingles. She has what looks like a lesion on the area of pain.

## 2019-06-20 NOTE — ED Notes (Signed)
Pt. Has small blister noted on L rib cage area and has been seen by her PMD for same ... given meds for this.

## 2019-06-20 NOTE — ED Provider Notes (Signed)
[facility] EMERGENCY DEPARTMENT Provider Note   CSN: [id number] Arrival date & time: 06/20/19  1415     History   Chief Complaint Chief Complaint  Patient presents with  . Rash    HPI Brooke Mora is a 85 y.o. female.     HPI Patient has left flank pain that developed Friday, 4 days ago.  She indicates her flank area and left lateral side.  Is very painful and aching.  She reports she has had kidney stones in the past and thought this might be a kidney stone.  No pain or burning with urination.  She has had loss of appetite and nausea.  She denies fever cough.  No shortness of breath.  No lower extremity swelling or calf pain.  Last kidney stone several years ago.  Patient seen in urgent care yesterday.  Diagnosed empirically with shingles and started on acyclovir.  No rash was noted at that time.  Treatment initiated on clinical impression. patient was given a IM Depomedrol.  Patient reports he has not improved.  Pain continues to be severe. Past Medical History:  Diagnosis Date  . COPD (chronic obstructive pulmonary disease) (HCC)    slight   . GERD (gastroesophageal reflux disease)   . High cholesterol   . Hypertension    patient states b/p up and down not on meds  . Kidney stones   . SVT (supraventricular tachycardia) [facility])     Patient Active Problem List   Diagnosis Date Noted  . 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 HTN (hypertension) 12/24/2015  . GERD (gastroesophageal reflux disease) 12/24/2015  . Nephrolithiasis 12/24/2015  . COPD exacerbation ([place]) 12/24/2015  . Former cigarette smoker 12/24/2015  . Hyperglycemia 12/24/2015  . Dyslipidemia 12/24/2015  . SVT (supraventricular tachycardia) ([place]) 11/01/2015  . Cavitary lesion of lung 11/01/2015  . Incisional hernia, without obstruction or gangrene, incarcerated 06/12/2014    Past Surgical History:  Procedure Laterality Date  . ABDOMINAL HYSTERECTOMY    . CATARACT  EXTRACTION    . HERNIA REPAIR    . INCISIONAL HERNIA REPAIR N/A 07/18/2014   Procedure: REPAIR OF INACERATED INCISIONAL HERNIA WITH MESH;  Surgeon: Armandina Gemma, MD;  [facility];  Service: General;  Laterality: N/A;  . INSERTION OF MESH N/A 07/18/2014   Procedure: INSERTION OF MESH;  Surgeon: Armandina Gemma, MD;  [facility];  Service: General;  Laterality: N/A;  . KNEE SURGERY    . VIDEO BRONCHOSCOPY Bilateral 11/05/2015   Procedure: VIDEO BRONCHOSCOPY WITHOUT FLUORO;  Surgeon: Javier Glazier, MD;  Location: [place];  Service: Cardiopulmonary;  Laterality: Bilateral;     OB History   No obstetric history on file.      Home Medications    Prior to Admission medications   Medication Sig Start Date End Date Taking? Authorizing Provider  acyclovir (ZOVIRAX) 800 MG tablet Take 1 tablet (800 mg total) by mouth 5 (five) times daily. 06/19/19   Rosemarie Ax, MD  aspirin 81 MG EC tablet Take 1 tablet (81 mg total) by mouth daily. 01/30/16   Michel Bickers, MD  cephALEXin (KEFLEX) 500 MG capsule Take 2 capsules (1,000 mg total) by mouth 2 (two) times daily. 06/20/19   Charlesetta Shanks, MD  gabapentin (NEURONTIN) 300 MG capsule Take 1 mg by mouth daily. 05/11/18   [provider]  HYDROcodone-acetaminophen (NORCO/VICODIN) 5-325 MG tablet Take 1-2 tablets by mouth every 6 (six) hours as needed for moderate pain or severe pain. 06/20/19  Charlesetta Shanks, MD  ibuprofen (ADVIL) 400 MG tablet Take 1 tablet (400 mg total) by mouth every 8 (eight) hours as needed. 06/20/19   Charlesetta Shanks, MD  metoprolol succinate (TOPROL-XL) 50 MG 24 hr tablet Take 1 tablet (50 mg total) by mouth daily. 04/28/19 04/22/20  Burnell Blanks, MD  omeprazole (PRILOSEC) 20 MG capsule Take 1 mg by mouth daily. 03/01/19   [provider]    Family History Family History  Problem Relation Age of Onset  . Diabetes Mother     Social History Social History   Tobacco Use  . Smoking status:  Former Smoker    Packs/day: 0.50    Years: 60.00    Pack years: 30.00    Types: Cigarettes    Quit date: 10/27/2006    Years since quitting: 12.6  . Smokeless tobacco: Never Used  Substance Use Topics  . Alcohol use: No    Alcohol/week: 0.0 standard drinks  . Drug use: No     Allergies   Nabumetone   Review of Systems Review of Systems 10 Systems reviewed and are negative for acute change except as noted in the HPI.  Physical Exam Updated Vital Signs BP (!) 178/74   Pulse 70   Temp 98.8 F (37.1 C) (Oral)   Resp 18   Ht 5\' 6"  (1.676 m)   Wt 81.6 kg   SpO2 93%   BMI 29.05 kg/m   Physical Exam Constitutional:      Appearance: Normal appearance.  HENT:     Head: Normocephalic and atraumatic.  Eyes:     Extraocular Movements: Extraocular movements intact.     Conjunctiva/sclera: Conjunctivae normal.  Neck:     Musculoskeletal: Neck supple.  Cardiovascular:     Rate and Rhythm: Normal rate and regular rhythm.     Pulses: Normal pulses.     Heart sounds: Normal heart sounds.  Pulmonary:     Effort: Pulmonary effort is normal.     Breath sounds: Normal breath sounds.  Abdominal:     General: There is no distension.     Palpations: Abdomen is soft.     Tenderness: There is abdominal tenderness. There is no guarding.     Comments: Patient has pain to palpation and percussion in the left CVA area.  No rash present.  Skin and soft tissues normal in appearance.  Musculoskeletal: Normal range of motion.        General: No swelling or tenderness.     Right lower leg: No edema.     Left lower leg: No edema.     Comments: Many varicosities but no peripheral edema.  Calves are soft nontender.  Skin:    General: Skin is warm and dry.  Neurological:     General: No focal deficit present.     Mental Status: She is alert and oriented to person, place, and time.     Coordination: Coordination normal.  Psychiatric:        Mood and Affect: Mood normal.      ED  Treatments / Results  Labs (all labs ordered are listed, but only abnormal results are displayed) Labs Reviewed  CBC - Abnormal; Notable for the following components:      Result Value   RBC 5.43 (*)    Hemoglobin 15.4 (*)    HCT 47.3 (*)    All other components within normal limits  COMPREHENSIVE METABOLIC PANEL - Abnormal; Notable for the following components:   Glucose, Bld  112 (*)    Calcium 8.8 (*)    All other components within normal limits  URINALYSIS, ROUTINE W REFLEX MICROSCOPIC - Abnormal; Notable for the following components:   APPearance CLOUDY (*)    Hgb urine dipstick SMALL (*)    Ketones, ur 15 (*)    Protein, ur 30 (*)    Nitrite POSITIVE (*)    Leukocytes,Ua SMALL (*)    All other components within normal limits  URINALYSIS, MICROSCOPIC (REFLEX) - Abnormal; Notable for the following components:   Bacteria, UA MANY (*)    All other components within normal limits  URINE CULTURE  LIPASE, BLOOD    EKG None  Radiology Ct Abdomen Pelvis W Contrast  Result Date: 06/20/2019 CLINICAL DATA:  LEFT flank pain off and on since Friday, question stone disease, history of kidney stones, COPD, hypertension, GERD, former smoker EXAM: CT ABDOMEN AND PELVIS WITH CONTRAST TECHNIQUE: Multidetector CT imaging of the abdomen and pelvis was performed using the standard protocol following bolus administration of intravenous contrast. Sagittal and coronal MPR images reconstructed from axial data set. No oral contrast administered. CONTRAST:  161mL OMNIPAQUE IOHEXOL 300 MG/ML  SOLN COMPARISON:  10/25/2014 Correlation: CT chest 07/08/2017 FINDINGS: Lower chest: Eventration at the lateral LEFT diaphragm unchanged. Small posterior RIGHT diaphragmatic defect with herniation of fat into the inferior RIGHT hemithorax. Lung bases clear. Hepatobiliary: Calcified gallstones in gallbladder. No gallbladder wall thickening by CT. Fatty infiltration of liver. Two hepatic cysts, larger at lateral segment  LEFT lobe liver 8.1 x 7.0 x 6.3 cm. No biliary dilatation. Pancreas: Partial fatty replacement at pancreatic head and uncinate. Otherwise normal appearance Spleen: Nonspecific 6 mm low-attenuation lesion image 27. Adrenals/Urinary Tract: Adrenal glands and RIGHT kidney normal appearance. Tiny nonobstructing calculus LEFT kidney image 37. No renal mass, hydronephrosis, or ureteral dilatation. Bladder decompressed. Stomach/Bowel: Pancolonic diverticulosis without evidence of diverticulitis. Normal appendix. Stomach decompressed. Stomach and bowel loops otherwise normal appearance. Vascular/Lymphatic: Atherosclerotic calcifications of aorta, coronary arteries, iliac arteries. Tortuous thoracoabdominal aorta with aneurysmal dilatation of the proximal abdominal aorta measuring 3.6 cm AP image 35, previously 2.8 cm AP at same level in 2015. Mid abdominal aorta measures 3.0 x 2.9 cm image 46. Reproductive: Uterus surgically absent. Nonvisualization of ovaries. Other: No free air or free fluid. Supraumbilical ventral fascial defect without hernia. No acute inflammatory process. BILATERAL inguinal hernias containing fat, larger on RIGHT. Musculoskeletal: No acute osseous findings. Bones appear demineralized. IMPRESSION: Cholelithiasis. Fatty liver with 2 hepatic cysts, larger measuring 8.1 cm greatest size. Pancolonic diverticulosis without evidence of diverticulitis. BILATERAL hernias containing fat. 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 Recommend followup by ultrasound in 2 years. This recommendation follows ACR consensus guidelines: White Paper of the ACR Incidental Findings Committee II on Vascular Findings. J Am Coll Radiol 2013; 10:789-794. Aortic aneurysm NOS (ICD10-I71.9) Electronically Signed   By: Lavonia Dana M.D.   On: 06/20/2019 17:50    Procedures Procedures (including critical care time)  Medications Ordered in ED Medications  ketorolac (TORADOL) 30  MG/ML injection 15 mg (15 mg Intravenous Given 06/20/19 1623)  cefTRIAXone (ROCEPHIN) 1 g in sodium chloride 0.9 % 100 mL IVPB ( Intravenous Stopped 06/20/19 1817)  iohexol (OMNIPAQUE) 300 MG/ML solution 100 mL (100 mLs Intravenous Contrast Given 06/20/19 1719)     Initial Impression / Assessment and Plan / ED Course  I have reviewed the triage vital signs and the nursing notes.  Pertinent labs & imaging results that were available  during my care of the patient were reviewed by me and considered in my medical decision making (see chart for details).       Consult: CT scan results of abdominal aneurysm reviewed with Dr. Oneida Alar.  Patient is not a surgical candidate at this time.  Appropriate for continued observation and monitoring.  Patient presents for significant flank pain.  No appreciable rash present.  At this time I cannot diagnose zoster without positive findings.  Patient has reproducible pain in the flank area.  It is reproduced with movement.  She does have positive urinalysis.  Will treat for UTI.  At this time there is no stranding to suggest severe pyelonephritis.  Patient is otherwise clinically well.  I feel she is stable for outpatient management.  Patient's granddaughter is present.  We reviewed all the CT findings including the aneurysm and the management.  She was already aware of its presence.  We reviewed pain control with 400 mg ibuprofen 3 times daily if needed.  Vicodin 1 tablet every 6 hours for additional pain control if needed.  Return precautions reviewed.  Patient will follow-up with PCP.  They will continue to observe for any development of rash to confirm zoster but otherwise discontinue acyclovir at this time.  Final Clinical Impressions(s) / ED Diagnoses   Final diagnoses:  Acute cystitis without hematuria  Flank pain  Abdominal aneurysm [facility])    ED Discharge Orders         Ordered    cephALEXin (KEFLEX) 500 MG capsule  2 times daily     06/20/19 1923     HYDROcodone-acetaminophen (NORCO/VICODIN) 5-325 MG tablet  Every 6 hours PRN     06/20/19 1923    ibuprofen (ADVIL) 400 MG tablet  Every 8 hours PRN     06/20/19 1923           Charlesetta Shanks, MD 06/20/19 1928

## 2019-06-20 NOTE — ED Notes (Signed)
To start meds as soon as Pt. Return to room from CT scan,

## 2019-06-20 NOTE — Telephone Encounter (Signed)
Called by Dr. Vallery Ridge to review patient's CT scan of the abdomen and pelvis.  Apparently some flank pain earlier today.  I reviewed the patient's CT Angie of the abdomen and pelvis.  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  There is no iliac aneurysm.  Mesenteric vessels fill and opacify with no evidence of obstruction.  Would only consider repair of this aneurysm if it were greater than 6 cm in diameter.  However, in light of her age most likely she would not be a candidate for repair.  I agree with radiology recommendation that she needs repeat CT angios abdomen and pelvis in 2 years.  She can certainly follow-up with [place] or her primary care physician regarding that.  Ruta Hinds, MD [facility] Office: [phone] Pager: [phone]

## 2019-06-20 NOTE — ED Notes (Signed)
Pt. Granddaughter at bedside who interprets for Pt.  Pt. Has off and on pain in the L flank area.  This started on Friday.  Pt. Has had no trouble with urination.  Pt. Crys when pain is present but no pain when it stops.

## 2019-06-22 LAB — URINE CULTURE: Culture: >=100000 — AB

## 2019-06-23 ENCOUNTER — Telehealth: Payer: Self-pay | Admitting: *Deleted

## 2019-06-23 ENCOUNTER — Telehealth: Payer: Self-pay | Admitting: Cardiovascular Disease

## 2019-06-23 NOTE — Telephone Encounter (Signed)
  Granddaughter is calling because patient was seen in the ED on 06/20/19 and would like Dr Angelena Form to look over the notes and give recommendation. ED Dr said her medications may need to be changed.

## 2019-06-23 NOTE — Telephone Encounter (Signed)
Post ED Visit - Positive Culture Follow-up  Culture report reviewed by antimicrobial stewardship pharmacist: [place] Team []  Elenor Quinones, Pharm.D. []  Heide Guile, Pharm.D., BCPS AQ-ID []  Parks Neptune, Pharm.D., BCPS []  Alycia Rossetti, Pharm.D., BCPS []  [place], Pharm.D., BCPS, AAHIVP []  Legrand Como, Pharm.D., BCPS, AAHIVP []  Salome Arnt, PharmD, BCPS []  Johnnette Gourd, PharmD, BCPS []  Hughes Better, PharmD, BCPS []  Leeroy Cha, PharmD []  Laqueta Linden, PharmD, BCPS []  Albertina Parr, PharmD  [place] Team []  Leodis Sias, PharmD []  Lindell Spar, PharmD []  Royetta Asal, PharmD []  Graylin Shiver, Rph []  Rema Fendt) Glennon Mac, PharmD []  Arlyn Dunning, PharmD []  Netta Cedars, PharmD []  Dia Sitter, PharmD []  Leone Haven, PharmD []  Gretta Arab, PharmD []  Theodis Shove, PharmD []  Peggyann Juba, PharmD []  Reuel Boom, PharmD Nicoletta Dress, PharmD  Positive urine culture Treated with Cephalexin, organism sensitive to the same and no further patient follow-up is required at this time.  Harlon Flor [facility] 06/23/2019, 8:55 AM

## 2019-06-27 NOTE — Telephone Encounter (Signed)
I reviewed the ED notes. She was treated for a UTI with flank pain. Incidental finding of a AAA. I cannot see any mention of any medication changes by the ED physician. Can we follow up with her granddaughter today to see what she is referencing? I see Brooke Mora in the office Thursday for a planned f/u appt. Thanks, [employer], routing message to [place], KK, JB and MW. Not sure who is working today.

## 2019-06-27 NOTE — Telephone Encounter (Signed)
Called to obtain BP and HR reading. Left message to call back.

## 2019-06-27 NOTE — Telephone Encounter (Signed)
Fonseca (the patient's granddaughter) states the ED physician states multiple times her BP medication needs to be increased because her BP was elevated. She reports her BP was 180s/80s the whole time they were in the ED. Confirmed with her the only medication taken with BP effect is Toprol 50 mg qd. She states the patient is feeling better since her UTI is being treated.  She has not checked her BP since discharge. She will see her around 1300 today and check her vital signs. She understands she will be called later to get readings.  She was grateful for assistance.

## 2019-06-28 NOTE — Telephone Encounter (Signed)
Called and left message for granddaughter to call back with BP and HR reading.

## 2019-06-29 NOTE — Telephone Encounter (Signed)
Pt seeing Dr. Angelena Form tomorrow.  Will plan to discuss at that time.

## 2019-06-29 NOTE — Progress Notes (Deleted)
No chief complaint on file.  History of Present Illness:85 yo female with history of SVT, sleep apnea, GERD, HLD, HTN and kidney stones who is here today for follow up. I saw her as a new patient 04/28/19 for evaluation of palpitations. She had been seen by Dr. Mare Ferrari while admitted to [facility] in January 2017. She has only been seen in our office once in February 2017 by Cecilie Kicks, NP. She was admitted to [facility] in January 2017 with SVT treated with adenosine. Echo January 2017 with normal LV size and function. She was started on a beta blocker. When I met her in July 2020, she c/o episodes of palpitations with associated dizziness occurring once per month. No chest pain or dyspnea. I arrange a cardiac event monitor which showed one episode of atrial flutter. She was seen in the ED 06/20/19 with flank pain and found to have a UTI. Incidental finding of 3.5 cm AAA on CT abdomen. Dr. Oneida Alar with [facility] was consulted and recommended repeat CTA abdomen in 2 years.   She is here today for follow up. The patient denies any chest pain, dyspnea, palpitations, lower extremity edema, orthopnea, PND, dizziness, near syncope or syncope.   Primary Care Physician: Benito Mccreedy, MD   Past Medical History:  Diagnosis Date  . COPD (chronic obstructive pulmonary disease) (HCC)    slight   . GERD (gastroesophageal reflux disease)   . High cholesterol   . Hypertension    patient states b/p up and down not on meds  . Kidney stones   . SVT (supraventricular tachycardia) (HCC)     Past Surgical History:  Procedure Laterality Date  . ABDOMINAL HYSTERECTOMY    . CATARACT EXTRACTION    . HERNIA REPAIR    . INCISIONAL HERNIA REPAIR N/A 07/18/2014   Procedure: REPAIR OF INACERATED INCISIONAL HERNIA WITH MESH;  Surgeon: Armandina Gemma, MD;  Location: [facility];  Service: General;  Laterality: N/A;  . INSERTION OF MESH N/A 07/18/2014   Procedure: INSERTION OF MESH;  Surgeon: Armandina Gemma, MD;  Location: [facility];  Service:  General;  Laterality: N/A;  . KNEE SURGERY    . VIDEO BRONCHOSCOPY Bilateral 11/05/2015   Procedure: VIDEO BRONCHOSCOPY WITHOUT FLUORO;  Surgeon: Javier Glazier, MD;  Location: [place];  Service: Cardiopulmonary;  Laterality: Bilateral;    Current Outpatient Medications  Medication Sig Dispense Refill  . acyclovir (ZOVIRAX) 800 MG tablet Take 1 tablet (800 mg total) by mouth 5 (five) times daily. 35 tablet 0  . aspirin 81 MG EC tablet Take 1 tablet (81 mg total) by mouth daily. 30 tablet 11  . cephALEXin (KEFLEX) 500 MG capsule Take 2 capsules (1,000 mg total) by mouth 2 (two) times daily. 28 capsule 0  . gabapentin (NEURONTIN) 300 MG capsule Take 1 mg by mouth daily.    Marland Kitchen HYDROcodone-acetaminophen (NORCO/VICODIN) 5-325 MG tablet Take 1-2 tablets by mouth every 6 (six) hours as needed for moderate pain or severe pain. 20 tablet 0  . ibuprofen (ADVIL) 400 MG tablet Take 1 tablet (400 mg total) by mouth every 8 (eight) hours as needed. 30 tablet 0  . metoprolol succinate (TOPROL-XL) 50 MG 24 hr tablet Take 1 tablet (50 mg total) by mouth daily. 30 tablet 11  . omeprazole (PRILOSEC) 20 MG capsule Take 1 mg by mouth daily.     No current facility-administered medications for this visit.     Allergies  Allergen Reactions  . Nabumetone Itching and Swelling  Social History   Socioeconomic History  . Marital status: Widowed    Spouse name: Not on file  . Number of children: Not on file  . Years of education: Not on file  . Highest education level: Not on file  Occupational History  . Not on file  Social Needs  . Financial resource strain: Not on file  . Food insecurity    Worry: Not on file    Inability: Not on file  . Transportation needs    Medical: Not on file    Non-medical: Not on file  Tobacco Use  . Smoking status: Former Smoker    Packs/day: 0.50    Years: 60.00    Pack years: 30.00    Types: Cigarettes    Quit date: 10/27/2006    Years since quitting: 12.6  .  Smokeless tobacco: Never Used  Substance and Sexual Activity  . Alcohol use: No    Alcohol/week: 0.0 standard drinks  . Drug use: No  . Sexual activity: Not on file  Lifestyle  . Physical activity    Days per week: Not on file    Minutes per session: Not on file  . Stress: Not on file  Relationships  . Social Herbalist on phone: Not on file    Gets together: Not on file    Attends religious service: Not on file    Active member of club or organization: Not on file    Attends meetings of clubs or organizations: Not on file    Relationship status: Not on file  . Intimate partner violence    Fear of current or ex partner: Not on file    Emotionally abused: Not on file    Physically abused: Not on file    Forced sexual activity: Not on file  Other Topics Concern  . Not on file  Social History Narrative  . Not on file    Family History  Problem Relation Age of Onset  . Diabetes Mother     Review of Systems:  As stated in the HPI and otherwise negative.   There were no vitals taken for this visit.  Physical Examination: General: Well developed, well nourished, NAD  HEENT: OP clear, mucus membranes moist  SKIN: warm, dry. No rashes. Neuro: No focal deficits  Musculoskeletal: Muscle strength 5/5 all ext  Psychiatric: Mood and affect normal  Neck: No JVD, no carotid bruits, no thyromegaly, no lymphadenopathy.  Lungs:Clear bilaterally, no wheezes, rhonci, crackles Cardiovascular: Regular rate and rhythm. No murmurs, gallops or rubs. Abdomen:Soft. Bowel sounds present. Non-tender.  Extremities: No lower extremity edema. Pulses are 2 + in the bilateral DP/PT.  EKG:  EKG is *** ordered today. The ekg ordered today demonstrates   Recent Labs: 04/28/2019: TSH 0.928 06/20/2019: ALT 17; BUN 13; Creatinine, Ser 0.81; Hemoglobin 15.4; Platelets 225; Potassium 3.8; Sodium 139   Lipid Panel    Component Value Date/Time   CHOL 247 (H) 01/04/2016 1032   TRIG 158 (H)  01/04/2016 1032   HDL 42 (L) 01/04/2016 1032   CHOLHDL 5.9 (H) 01/04/2016 1032   VLDL 32 (H) 01/04/2016 1032   LDLCALC 173 (H) 01/04/2016 1032     Wt Readings from Last 3 Encounters:  06/20/19 180 lb (81.6 kg)  06/19/19 170 lb (77.1 kg)  04/28/19 187 lb 3.2 oz (84.9 kg)     Other studies Reviewed: Additional studies/ records that were reviewed today include: old records, EKG Review of the above records  demonstrates:    Assessment and Plan:   1. Atrial flutter, paroxysmal: ***  Continue beta blocker.   Current medicines are reviewed at length with the patient today.  The patient does not have concerns regarding medicines.  The following changes have been made:  no change  Labs/ tests ordered today include:   No orders of the defined types were placed in this encounter.    Disposition:   FU with care team APP in  2-3 months.    Signed, Lauree Chandler, MD 06/29/2019 3:07 PM    [facility] [address], [place], [place]  [address] Phone: [phone]; Fax: [phone]

## 2019-06-30 ENCOUNTER — Ambulatory Visit: Payer: Medicaid Other | Admitting: Cardiovascular Disease

## 2019-06-30 ENCOUNTER — Telehealth: Payer: Self-pay | Admitting: Cardiovascular Disease

## 2019-06-30 NOTE — Telephone Encounter (Signed)
No show for appt today with me.   Brooke Mora

## 2019-06-30 NOTE — Telephone Encounter (Signed)
New Message     Patient's granddaughter calling in to report BP's which are:  165/82 3 hours later 158/70

## 2019-06-30 NOTE — Telephone Encounter (Signed)
Pt no showed for appt today.  

## 2019-06-30 NOTE — Telephone Encounter (Signed)
I spoke with pt's grand daughter who reports BP readings below are from yesterday. [place] daughter reports she did not have a chance to call and cancel today's appointment due to her work schedule.  Future appointments will need to be in the afternoon per grand daughter. I asked grand daughter to check BP and heart rate daily over the next 5-6 days and call us the middle of next week with these readings.  Follow up appointment can be scheduled at that time.

## 2019-07-07 NOTE — Telephone Encounter (Signed)
I spoke with pt's grand daughter who does not have BP readings with her.  Yesterday pt was seen at primary care and BP was good.  Lab work was also checked.  [place] daughter will have office note and lab work faxed to our office.  I rescheduled pt to see Melina Copa, PA on October 6,2020 at 2:15

## 2019-07-31 NOTE — Progress Notes (Addendum)
Cardiology Office Note:    Date:  08/02/2019   ID:  Brooke Mora, DOB [birth year], MRN [id number]  PCP:  Brooke Mccreedy, MD  Cardiologist:  Brooke Chandler, MD  Electrophysiologist:  None   Referring MD: Brooke Mccreedy, MD   Chief Complaint: follow-up of palpitations  History of Present Illness:    Brooke Mora is a 85 y.o. female with a history of SVT, hypertension, hyperlipidemia, sleep apnea, GERD, and kidney stones who is followed by Dr. Angelena Form and presents today for follow-up of palpitations.  She was initially seen by Brooke Mora in the hospital in 2/2017after she was admitted for SVT treated with Adenosine. Echo from that time showed normal LV size and function and she was started on a beta-blocker. She was seen once by Cecilie Kicks, NP, in our office following that and then was lost to follow-up. Patient was last seen by Dr. Angelena Form in 04/2019 to re-establish care and for further evaluation of palpitations. At that visit, she reported dizziness with feelings of palpitations that occur about about once per month. She denied any chest pain or dyspnea at that time. Patient reportedly had recent Echo at PCP's office prior to this visit which was normal.Toprol-XL was increased from 25mg  to 50mg  daily. BMET and TSH were unremarkable at that visit. 30-day event monitor was ordered and showed one episode of rate-controlled atrial flutter at the beginning of the monitoring period but otherwise was normal with sinus rhythm/bradycardia and rare PVCs.   Patient seen at [facility] on 06/19/2019 for evaluation of flank pain that followed along dermatome on the left side. She had generalized redness on left flank but no specific rash.  Pain was felt to most likely be associated with shingles and patient was prescribed Acyclovir. Patient presented to the ED the next for continued pain. Abdominal/Pelvic CT was ordered and showed cholelithiasis, fatty liver with 2 hepatic cysts,  diverticulosis without evidence of diverticulitis, bilateral hernias containing fat, and aneurysmal dilatation of the proximal and mid abdominal aorta increased from prior study. She had a positive urinarlysis so she was treated for UTI. She was felt to be stable for outpatient management and was discharged with instructions to follow-up with PCP. Of note, BP was noted to be significantly elevated in the ED at 178/74. Dr. Oneida Alar reviewed CT with ED provider and reported they would only consider repair if aneurysm >6cm in diameter but given her age would likely not be a candidate for repair. Recommended repeat CTA in 2 years.  Patient was supposed to be seen in our office on 06/30/2019 with Dr. Angelena Form for follow-up of outpatient monitor and were going to follow-up on BP at that time as well. However, patient was not able to make this appointment. Appointment was rescheduled for today.  Patient presents today with her granddaughter who patient wanted to serve as her interpreter. Patient doing well. She had one episode of palpitations with associated dizziness about 1 week after the monitor came off. Unsure how long the episode lasted for because patient went and fell asleep afterwards. No falls or syncope with this. No recurrent episodes. No chest pain, shortness of breath, orthopnea, PND, or edema. Patient has sleep apnea and is compliant with CPAP. BP well controlled today at 120/68. However, granddaughter reports it is sometimes higher at home in the 140's to 150's/80's-90's. Patient otherwise doing well. No recent fevers or illnesses. She has no specific questions or concerns today.  Past Medical History:  Diagnosis Date  .  COPD (chronic obstructive pulmonary disease) (HCC)    slight   . GERD (gastroesophageal reflux disease)   . High cholesterol   . Hypertension    patient states b/p up and down not on meds  . Kidney stones   . SVT (supraventricular tachycardia) (HCC)     Past Surgical History:   Procedure Laterality Date  . ABDOMINAL HYSTERECTOMY    . CATARACT EXTRACTION    . HERNIA REPAIR    . INCISIONAL HERNIA REPAIR N/A 07/18/2014   Procedure: REPAIR OF INACERATED INCISIONAL HERNIA WITH MESH;  Surgeon: Armandina Gemma, MD;  Location: [facility];  Service: General;  Laterality: N/A;  . INSERTION OF MESH N/A 07/18/2014   Procedure: INSERTION OF MESH;  Surgeon: Armandina Gemma, MD;  Location: [facility];  Service: General;  Laterality: N/A;  . KNEE SURGERY    . VIDEO BRONCHOSCOPY Bilateral 11/05/2015   Procedure: VIDEO BRONCHOSCOPY WITHOUT FLUORO;  Surgeon: Javier Glazier, MD;  Location: [place];  Service: Cardiopulmonary;  Laterality: Bilateral;    Current Medications: Current Meds  Medication Sig  . aspirin 81 MG EC tablet Take 1 tablet (81 mg total) by mouth daily.  Marland Kitchen gabapentin (NEURONTIN) 300 MG capsule Take 1 mg by mouth daily.  Marland Kitchen ibuprofen (ADVIL) 400 MG tablet Take 1 tablet (400 mg total) by mouth every 8 (eight) hours as needed.  . metoprolol succinate (TOPROL-XL) 50 MG 24 hr tablet Take 1 tablet (50 mg total) by mouth daily.  Marland Kitchen omeprazole (PRILOSEC) 20 MG capsule Take 1 mg by mouth daily.     Allergies:   Nabumetone   Social History   Socioeconomic History  . Marital status: Widowed    Spouse name: Not on file  . Number of children: Not on file  . Years of education: Not on file  . Highest education level: Not on file  Occupational History  . Not on file  Social Needs  . Financial resource strain: Not on file  . Food insecurity    Worry: Not on file    Inability: Not on file  . Transportation needs    Medical: Not on file    Non-medical: Not on file  Tobacco Use  . Smoking status: Former Smoker    Packs/day: 0.50    Years: 60.00    Pack years: 30.00    Types: Cigarettes    Quit date: 10/27/2006    Years since quitting: 12.7  . Smokeless tobacco: Never Used  Substance and Sexual Activity  . Alcohol use: No    Alcohol/week: 0.0 standard drinks  . Drug use:  No  . Sexual activity: Not on file  Lifestyle  . Physical activity    Days per week: Not on file    Minutes per session: Not on file  . Stress: Not on file  Relationships  . Social Herbalist on phone: Not on file    Gets together: Not on file    Attends religious service: Not on file    Active member of club or organization: Not on file    Attends meetings of clubs or organizations: Not on file    Relationship status: Not on file  Other Topics Concern  . Not on file  Social History Narrative  . Not on file     Family History: The patient's family history includes Diabetes in her mother.  ROS:   Please see the history of present illness.    All other systems reviewed and are  negative.  EKGs/Labs/Other Studies Reviewed:    The following studies were reviewed today:  30-Day Event Monitor 05/04/2019 to 06/02/2019: Sinus rhythm with sinus bradycardia Rare premature ventricular contractions One episode of rate controlled atrial flutter.   EKG:  EKG not ordered today.  Recent Labs: 04/28/2019: TSH 0.928 06/20/2019: ALT 17; BUN 13; Creatinine, Ser 0.81; Hemoglobin 15.4; Platelets 225; Potassium 3.8; Sodium 139  Recent Lipid Panel    Component Value Date/Time   CHOL 247 (H) 01/04/2016 1032   TRIG 158 (H) 01/04/2016 1032   HDL 42 (L) 01/04/2016 1032   CHOLHDL 5.9 (H) 01/04/2016 1032   VLDL 32 (H) 01/04/2016 1032   LDLCALC 173 (H) 01/04/2016 1032    Physical Exam:    VS:  BP 120/68   Pulse (!) 57   Ht 5\' 6"  (1.676 m)   Wt 182 lb 6.4 oz (82.7 kg)   SpO2 94%   BMI 29.44 kg/m     Wt Readings from Last 3 Encounters:  08/02/19 182 lb 6.4 oz (82.7 kg)  06/20/19 180 lb (81.6 kg)  06/19/19 170 lb (77.1 kg)    General: 85 y.o. female resting comfortably in no acute distress. HEENT: Normocephalic and atraumatic. Sclera clear. Neck: Supple. No carotid bruits. No JVD. Heart: Borderline bradycardic with regular rhythm. Distinct S1 and S2. No murmurs, gallops, or  rubs. Radial pulses 2+ and equal bilaterally. Lungs: No increased work of breathing. Clear to ausculation bilaterally. No wheezes, rhonchi, or rales. . Abdomen: Soft, non-distended, and non-tender to palpation. Bowel sounds present. MSK: Normal strength and tone for age. Extremities: No lower extremity edema. Skin: Warm and dry. Neuro: Alert and oriented x3. No focal deficits. Psych: Normal affect. Responds appropriately.   ASSESSMENT:    1. Palpitations   2. Brief Episode of Atrial Flutter   3. History of SVT   4. Thoracoabdominal aortic aneurysm (TAAA) without rupture ([place])   5. Essential hypertension   6. Hyperlipidemia, unspecified hyperlipidemia type    PLAN:    Palpitations / Brief Episode of Atrial Flutter/ History of SVT - Recent 30 day monitor showed one episode of rate-controlled atrial flutter at the beginning of the monitoring period but otherwise was normal with sinus rhythm/bradycardia and rare PVCs. Episode lasted for about 1 minute. Of note, baseline rate did not changed during this episode and there is no documentation of initiation and cessation of rhythm. Patient asymptomatic with this. Reviewed results of monitor with patient and granddaughter today. - Patient reportedly had an Echo done at her PCP's office a couple of months ago. Granddaughter is going to fax the report to our office.  - No EKG was obtained at visit today but regular rhythm on exam. - Patient has only had one episode of palpitations/dizziness since monitor came off but states she is doing well.  - Continue Toprol-XL 50mg  daily. - CHA2DS-VASc = 4 (HTN, aortic aneurysm, age x2, female). However, given very brief episode of atrial flutter would not anticoagulate at this time. If patient has documented recurrence of atrial fibrillation, will need to re-discuss this. - Patient to notify use if having worsening or more frequent episodes of palpitations/dizziness.  Thoracoabdominal Aortic Aneurysm -  Abdominal/Pelvic CT from 06/20/2019 showed tortuous thoracoabdominal aorta with aneurysmal dilation of the proximal aorta measuring 3.6 cm in diameter with no evidence of rupture. - Dr. Oneida Alar reviewed CT and stated "Would only consider repair of this aneurysm if it were greater than 6 cm in diameter.  However, in light of her  age most likely she would not be a candidate for repair." Recommended repeat CTA in 2 years.  - Discussed aneurysm precautions with patient and granddaughter including the importance of BP control, avoiding flouroquinolones, and avoiding Valsalva. Included information in AVS.  Hypertension - BP well controlled today at 120/68. Granddaughter states BP often normal in the office but sometimes higher at home in the 140's-150's/80's-90's. - Continue Toprol-XL 50mg  daily. - Discussed the importance of keeping BP well controlled given aortic aneurysm. Granddaughter works in a [employer] and checks her pressure manually. Advised granddaughter to continue to monitor BP at home and to start a daily BP log if she notices BP is elevated. Patient to notify us if BP consistently elevated.  Hyperlipidemia - Most recent lipid panel from 2017: Total Cholesterol 247, Triglycerides 158, HDL 42, LDL 173.  Looks like patient was advised to start Lipitor 80 at that time but it does not look like she did. - Granddaughter states patient recently had labs checked at PCP but is not sure if lipid panel was included in this. Have asked granddaughter to fax recent labs to [place]. If lipid panel was not included, will recheck and likely start Lipitor 40mg  daily.  Sleep Apnea - Compliant with CPAP.  Disposition: Follow-up with MD or APP in 6 months.   Medication Adjustments/Labs and Tests Ordered: Current medicines are reviewed at length with the patient today.  Concerns regarding medicines are outlined above.  No orders of the defined types were placed in this encounter.  No orders of the defined  types were placed in this encounter.     Signed, Darreld Mclean, PA-C  08/02/2019 3:46 PM    [facility]

## 2019-08-02 ENCOUNTER — Encounter: Payer: Self-pay | Admitting: Student

## 2019-08-02 ENCOUNTER — Other Ambulatory Visit: Payer: Self-pay

## 2019-08-02 ENCOUNTER — Ambulatory Visit: Payer: Medicaid Other | Admitting: Student

## 2019-08-02 VITALS — BP 120/68 | HR 57 | Ht 66.0 in | Wt 182.4 lb

## 2019-08-02 DIAGNOSIS — R002 Palpitations: Secondary | ICD-10-CM

## 2019-08-02 DIAGNOSIS — I716 Thoracoabdominal aortic aneurysm, without rupture, unspecified: Secondary | ICD-10-CM

## 2019-08-02 DIAGNOSIS — I471 Supraventricular tachycardia: Secondary | ICD-10-CM | POA: Diagnosis not present

## 2019-08-02 DIAGNOSIS — I4892 Unspecified atrial flutter: Secondary | ICD-10-CM

## 2019-08-02 DIAGNOSIS — E785 Hyperlipidemia, unspecified: Secondary | ICD-10-CM

## 2019-08-02 DIAGNOSIS — I10 Essential (primary) hypertension: Secondary | ICD-10-CM

## 2019-08-02 NOTE — Patient Instructions (Addendum)
Medication Instructions:  Your physician recommends that you continue on your current medications as directed. Please refer to the Current Medication list given to you today.   If you need a refill on your cardiac medications before your next appointment, please call your pharmacy.   Lab work: None ordered  If you have labs (blood work) drawn today and your tests are completely normal, you will receive your results only by: Marland Kitchen MyChart Message (if you have MyChart) OR . A paper copy in the mail If you have any lab test that is abnormal or we need to change your treatment, we will call you to review the results.  Testing/Procedures: None ordered  Follow-Up: At [facility], you and your health needs are our priority.  As part of our continuing mission to provide you with exceptional heart care, we have created designated Provider Care Teams.  These Care Teams include your primary Cardiologist (physician) and Advanced Practice Providers (APPs -  Physician Assistants and Nurse Practitioners) who all work together to provide you with the care you need, when you need it. You will need a follow up appointment in 6 months.  Please call our office 2 months in advance to schedule this appointment.  You may see Lauree Chandler, MD or one of the following Advanced Practice Providers on your designated Care Team:   [place], PA-C Melina Copa, PA-C . Ermalinda Barrios, PA-C  Any Other Special Instructions Will Be Listed Below (If Applicable).   Information About Your Aneurysm  CT scan from recent Emergency Department visit showed thoracoabdominal aneurysm. The word "aneurysm" refers to a bulge in an artery (blood vessel). Most people think of them in the context of an emergency, but yours was found incidentally. At this point there is nothing you need to do from a procedure standpoint, but there are some important things to keep in mind for day-to-day life.  Mainstays of therapy for  aneurysms include very good blood pressure control, healthy lifestyle, and avoiding tobacco products and street drugs. Research has raised concern that antibiotics in the fluoroquinolone class could be associated with increased risk of having an aneurysm develop or tear. This includes medicines that end in "floxacin," like Cipro or Levaquin. Make sure to discuss this information with other healthcare providers if you require antibiotics.  Since aneurysms can run in families, you should discuss your diagnosis with first degree relatives as they may need to be screened for this. Regular mild-moderate physical exercise is important, but avoid heavy lifting/weight lifting over 30lbs, chopping wood, shoveling snow or digging heavy earth with a shovel. It is best to avoid activities that cause grunting or straining (medically referred to as a "Valsalva maneuver"). This happens when a person bears down against a closed throat to increase the strength of arm or abdominal muscles. There's often a tendency to do this when lifting heavy weights, doing sit-ups, push-ups or chin-ups, etc., but it may be harmful.  This is a finding I would expect to be monitored periodically by your PCP or cardiology team. Most unruptured thoracic aortic aneurysms cause no symptoms, so they are often found during exams for other conditions. Contact a health care provider if you develop any discomfort in your upper back, neck, abdomen, trouble swallowing, cough or hoarseness, or unexplained weight loss. Get help right away if you develop severe pain in your upper back or abdomen that may move into your chest and arms, or any other concerning symptoms such as shortness of breath or fever.

## 2019-08-12 ENCOUNTER — Telehealth: Payer: Self-pay | Admitting: *Deleted

## 2019-08-12 DIAGNOSIS — R002 Palpitations: Secondary | ICD-10-CM

## 2019-08-12 DIAGNOSIS — I471 Supraventricular tachycardia: Secondary | ICD-10-CM

## 2019-08-12 NOTE — Telephone Encounter (Signed)
Call placed to pt's PCP, [facility], spoke with Freida Busman. Pt does have Lipid panel on file, they will send it to [place], but not a echo.

## 2019-08-12 NOTE — Telephone Encounter (Signed)
-----   Message from Darreld Mclean, [place] sent at 08/11/2019  4:48 PM EDT ----- Regarding: Echo and Labs Hi Anderson [place],   This is the patient I was telling you about. Can we reach out to PCP and ask them to fax over any recent labs (specifically lipid panel) and last Echo?   Thank you! Callie

## 2019-08-15 NOTE — Telephone Encounter (Addendum)
Thanks [employer]. Since she actually hasn't had an Echo, can we get scheduled one given her report of palpitations and history of SVT?  Thank you!

## 2019-08-16 NOTE — Addendum Note (Signed)
Addended by: Gaetano Net on: 08/16/2019 08:47 AM   Modules accepted: Orders

## 2019-08-16 NOTE — Telephone Encounter (Signed)
Spoke with Brooke Mora, Waiver on file to speak with her, and she has been made aware that we have ordered a echocardiogram and someone will reach out to her to get it scheduled. She verbalized understanding and was grateful for the call.

## 2019-08-23 ENCOUNTER — Other Ambulatory Visit (HOSPITAL_COMMUNITY): Payer: Medicaid Other

## 2019-09-01 ENCOUNTER — Other Ambulatory Visit (HOSPITAL_COMMUNITY): Payer: Medicaid Other

## 2019-09-08 ENCOUNTER — Other Ambulatory Visit: Payer: Self-pay

## 2019-09-08 ENCOUNTER — Ambulatory Visit (HOSPITAL_COMMUNITY): Payer: Medicaid Other | Attending: Cardiology

## 2019-09-08 DIAGNOSIS — I471 Supraventricular tachycardia: Secondary | ICD-10-CM | POA: Diagnosis present

## 2019-09-08 DIAGNOSIS — R002 Palpitations: Secondary | ICD-10-CM | POA: Diagnosis present

## 2020-04-04 ENCOUNTER — Other Ambulatory Visit: Payer: Self-pay | Admitting: Physician Assistant

## 2020-04-05 ENCOUNTER — Other Ambulatory Visit: Payer: Self-pay | Admitting: Physician Assistant

## 2020-04-13 ENCOUNTER — Other Ambulatory Visit: Payer: Self-pay | Admitting: Physician Assistant

## 2020-04-13 DIAGNOSIS — R918 Other nonspecific abnormal finding of lung field: Secondary | ICD-10-CM

## 2020-04-17 NOTE — Progress Notes (Deleted)
No chief complaint on file.  History of Present Illness:86 yo female with history of SVT, sleep apnea, GERD, HLD, HTN and kidney stones here today for cardiac follow up. I saw her as a new patient for evaluation of palpitations in July 2020. Brooke Mora She was admitted to [facility] in January 2017 with SVT treated with adenosine. Echo January 2017 with normal LV size and function. She was started on a beta blocker. At her visit in our office in July 2020 she complained of recurrent palpitations with dizziness. 30 day cardiac monitor in 2020 showed an isolated episode of atrial flutter and PVCs. CTabdomen in August 2020 with abdominal aortic aneurysm. She was seen by Dr. Oneida Alar with Vascular surgery and he recommended a repeat CTA abdomen in 2 years.   She is here today for follow up. The patient denies any chest pain, dyspnea, palpitations, lower extremity edema, orthopnea, PND, dizziness, near syncope or syncope.   Primary Care Physician: Benito Mccreedy, MD   Past Medical History:  Diagnosis Date  . COPD (chronic obstructive pulmonary disease) (HCC)    slight   . GERD (gastroesophageal reflux disease)   . High cholesterol   . Hypertension    patient states b/p up and down not on meds  . Kidney stones   . SVT (supraventricular tachycardia) (HCC)     Past Surgical History:  Procedure Laterality Date  . ABDOMINAL HYSTERECTOMY    . CATARACT EXTRACTION    . HERNIA REPAIR    . INCISIONAL HERNIA REPAIR N/A 07/18/2014   Procedure: REPAIR OF INACERATED INCISIONAL HERNIA WITH MESH;  Surgeon: Armandina Gemma, MD;  Location: [facility];  Service: General;  Laterality: N/A;  . INSERTION OF MESH N/A 07/18/2014   Procedure: INSERTION OF MESH;  Surgeon: Armandina Gemma, MD;  Location: [facility];  Service: General;  Laterality: N/A;  . KNEE SURGERY    . VIDEO BRONCHOSCOPY Bilateral 11/05/2015   Procedure: VIDEO BRONCHOSCOPY WITHOUT FLUORO;  Surgeon: Javier Glazier, MD;  Location: [place];  Service: Cardiopulmonary;   Laterality: Bilateral;    Current Outpatient Medications  Medication Sig Dispense Refill  . aspirin 81 MG EC tablet Take 1 tablet (81 mg total) by mouth daily. 30 tablet 11  . gabapentin (NEURONTIN) 300 MG capsule Take 1 mg by mouth daily.    Brooke Mora ibuprofen (ADVIL) 400 MG tablet Take 1 tablet (400 mg total) by mouth every 8 (eight) hours as needed. 30 tablet 0  . metoprolol succinate (TOPROL-XL) 50 MG 24 hr tablet Take 1 tablet (50 mg total) by mouth daily. 30 tablet 11  . omeprazole (PRILOSEC) 20 MG capsule Take 1 mg by mouth daily.     No current facility-administered medications for this visit.    Allergies  Allergen Reactions  . Nabumetone Itching and Swelling    Social History   Socioeconomic History  . Marital status: Widowed    Spouse name: Not on file  . Number of children: Not on file  . Years of education: Not on file  . Highest education level: Not on file  Occupational History  . Not on file  Tobacco Use  . Smoking status: Former Smoker    Packs/day: 0.50    Years: 60.00    Pack years: 30.00    Types: Cigarettes    Quit date: 10/27/2006    Years since quitting: 13.4  . Smokeless tobacco: Never Used  Vaping Use  . Vaping Use: Never used  Substance and Sexual Activity  . Alcohol  use: No    Alcohol/week: 0.0 standard drinks  . Drug use: No  . Sexual activity: Not on file  Other Topics Concern  . Not on file  Social History Narrative  . Not on file   Social Determinants of Health   Financial Resource Strain:   . Difficulty of Paying Living Expenses:   Food Insecurity:   . Worried About Charity fundraiser in the Last Year:   . Arboriculturist in the Last Year:   Transportation Needs:   . Film/video editor (Medical):   Brooke Mora Lack of Transportation (Non-Medical):   Physical Activity:   . Days of Exercise per Week:   . Minutes of Exercise per Session:   Stress:   . Feeling of Stress :   Social Connections:   . Frequency of Communication with Friends  and Family:   . Frequency of Social Gatherings with Friends and Family:   . Attends Religious Services:   . Active Member of Clubs or Organizations:   . Attends Archivist Meetings:   Brooke Mora Marital Status:   Intimate Partner Violence:   . Fear of Current or Ex-Partner:   . Emotionally Abused:   Brooke Mora Physically Abused:   . Sexually Abused:     Family History  Problem Relation Age of Onset  . Diabetes Mother     Review of Systems:  As stated in the HPI and otherwise negative.   There were no vitals taken for this visit.  Physical Examination: General: Well developed, well nourished, NAD  HEENT: OP clear, mucus membranes moist  SKIN: warm, dry. No rashes. Neuro: No focal deficits  Musculoskeletal: Muscle strength 5/5 all ext  Psychiatric: Mood and affect normal  Neck: No JVD, no carotid bruits, no thyromegaly, no lymphadenopathy.  Lungs:Clear bilaterally, no wheezes, rhonci, crackles Cardiovascular: Regular rate and rhythm. No murmurs, gallops or rubs. Abdomen:Soft. Bowel sounds present. Non-tender.  Extremities: No lower extremity edema. Pulses are 2 + in the bilateral DP/PT.  EKG:  EKG is *** ordered today. The ekg ordered today demonstrates   Recent Labs: 04/28/2019: TSH 0.928 06/20/2019: ALT 17; BUN 13; Creatinine, Ser 0.81; Hemoglobin 15.4; Platelets 225; Potassium 3.8; Sodium 139   Lipid Panel    Component Value Date/Time   CHOL 247 (H) 01/04/2016 1032   TRIG 158 (H) 01/04/2016 1032   HDL 42 (L) 01/04/2016 1032   CHOLHDL 5.9 (H) 01/04/2016 1032   VLDL 32 (H) 01/04/2016 1032   LDLCALC 173 (H) 01/04/2016 1032     Wt Readings from Last 3 Encounters:  08/02/19 182 lb 6.4 oz (82.7 kg)  06/20/19 180 lb (81.6 kg)  06/19/19 170 lb (77.1 kg)     Other studies Reviewed: Additional studies/ records that were reviewed today include: old records, EKG Review of the above records demonstrates:    Assessment and Plan:   1. Palpitations, history of SVT: ***    Current medicines are reviewed at length with the patient today.  The patient does not have concerns regarding medicines.  The following changes have been made:  no change  Labs/ tests ordered today include:   No orders of the defined types were placed in this encounter.    Disposition:   FU with care team APP in  2-3 months.    Signed, Lauree Chandler, MD 04/17/2020 7:33 PM    [address] [place], [place], [place]  [address] Phone: [phone]; Fax: [phone]

## 2020-04-18 ENCOUNTER — Ambulatory Visit: Payer: Medicaid Other | Admitting: Cardiovascular Disease

## 2020-04-23 ENCOUNTER — Other Ambulatory Visit: Payer: Self-pay

## 2020-04-23 MED ORDER — METOPROLOL SUCCINATE ER 50 MG PO TB24: 50.0000 mg | ORAL_TABLET | Freq: Every day | ORAL | 0 refills | Status: DC

## 2020-04-27 ENCOUNTER — Other Ambulatory Visit: Payer: Self-pay

## 2020-04-27 ENCOUNTER — Ambulatory Visit
Admission: RE | Admit: 2020-04-27 | Discharge: 2020-04-27 | Disposition: A | Discharge status: Home / Self Care | Payer: Medicaid Other | Source: Ambulatory Visit | Attending: Physician Assistant | Admitting: Physician Assistant

## 2020-04-27 ENCOUNTER — Other Ambulatory Visit: Payer: Medicaid Other

## 2020-04-27 DIAGNOSIS — R918 Other nonspecific abnormal finding of lung field: Secondary | ICD-10-CM

## 2020-04-27 MED ORDER — IOPAMIDOL (ISOVUE-300) INJECTION 61%
75.0000 mL | Freq: Once | INTRAVENOUS | Status: AC | PRN
  Administered 2020-04-27: 75 mL via INTRAVENOUS

## 2020-05-15 ENCOUNTER — Ambulatory Visit (INDEPENDENT_AMBULATORY_CARE_PROVIDER_SITE_OTHER): Payer: Medicaid Other | Admitting: Primary Care

## 2020-05-15 ENCOUNTER — Other Ambulatory Visit: Payer: Self-pay

## 2020-05-15 ENCOUNTER — Encounter: Payer: Self-pay | Admitting: Primary Care

## 2020-05-15 VITALS — BP 128/74 | HR 55 | Temp 97.6°F | Ht 65.0 in | Wt 190.0 lb

## 2020-05-15 DIAGNOSIS — A310 Pulmonary mycobacterial infection: Secondary | ICD-10-CM | POA: Diagnosis not present

## 2020-05-15 NOTE — Progress Notes (Signed)
_0  ID: Brooke Mora, female    DOB: [birth year], 86 y.o.   MRN: [id number]  Chief Complaint  Patient presents with  . Follow-up    Referring provider: Benito Mccreedy, MD  HPI: 86 year old female, former smoker quit in 2008 (30-pack-year history).  Past medical history significant for pulmonary MAI, pulmonary nodules, emphysema, hypersomnia, OSA. Patient of Dr. Melvyn Novas, last seen in office on 12/07/2017. Bilateral upper lobe nodular opacities: Cavitary right upper lobe nodule as well as left upper lobe nodular opacity. 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 OSA managed by [facility]. She was started on CPAP therapy at 8cm h20 in July 2019.   Previous [facility] pulmonary encounters: 12/07/2017- Dr. Melvyn Novas Pulmonary MAI,This is an extremely common benign condition in the elderly and does not warrant aggressive eval/ rx at this point unless there is a clinical correlation suggesting unaddressed pulmonary infection (purulent sputum, night sweats, unintended wt loss, doe) or evolution of  obvious changes on plain cxr (as opposed to serial CT, which is way over sensitive to make clinical decisions re intervention and treatment in the elderly, who tend to tolerate both dx and treatment poorly) . For cough > mucinex dm 600 mg up to 2 every 12 hours as needed. Please remember to go to the  x-ray department downstairs in the basement  for your tests - we will call you with the results when they are available. Please schedule a follow up visit in  6  months but call sooner if needed    05/15/2020- interim hx Patient presents today to re-establish care. Accompanied by granddaughter who helps translate. She had abnormal CT chest imaging in July ordered by outside provider. Patient reports some shortness of breath associated with heart palpitations. Her cardiac medication was increased, she is now taking metoprolol to 7m daily. Denies weight loss, night sweats, cough, chest tightness or  wheezing.    PFT 12/21/15:  FVC 2.31 L (91%) FEV1 1.78 L (94%) FEV1/FVC 0.77 FEF 25-75 1.63 L (131%) no bronchodilator response TLC 10.16 L (204%) RV 329% ERV 131% (patient unable to perform DLCO)  PATHOLOGY RUL BAL (11/05/15): No malignancy.  MICROBIOLOGY: RUL BAL (1/9) AFB: Smear Negative / Ctx negative Fungal: Smear Negative / Ctx negative Routine: Normal oral flora  Sputum AFB (11/02/15): Smear Negative / MAC on culture  Quantiferon-TB (11/02/15): Negative Blood Ctx x2 (11/02/15): Negative    IMAGING:  CXR PA and Lateral:   12/07/2017 : appearance of the chest has not significantly changed since the previous studies. There remain abnormalities in the apices. Soft tissue density at the left lung base persists as well.  Allergies  Allergen Reactions  . Nabumetone Itching and Swelling    Immunization History  Administered Date(s) Administered  . Influenza, High Dose Seasonal PF 12/01/2016, 12/07/2017  . Influenza,inj,Quad PF,6+ Mos 11/16/2015  . PPD Test 11/02/2015    Past Medical History:  Diagnosis Date  . COPD (chronic obstructive pulmonary disease) (HCC)    slight   . GERD (gastroesophageal reflux disease)   . High cholesterol   . Hypertension    patient states b/p up and down not on meds  . Kidney stones   . SVT (supraventricular tachycardia) (HCC)     Tobacco History: Social History   Tobacco Use  Smoking Status Former Smoker  . Packs/day: 0.50  . Years: 60.00  . Pack years: 30.00  . Types: Cigarettes  . Quit date: 10/27/2006  . Years since quitting: 13.5  Smokeless Tobacco Never Used  Counseling given: Not Answered   Outpatient Medications Prior to Visit  Medication Sig Dispense Refill  . gabapentin (NEURONTIN) 300 MG capsule Take 1 mg by mouth daily.    Marland Kitchen ibuprofen (ADVIL) 400 MG tablet Take 1 tablet (400 mg total) by mouth every 8 (eight) hours as needed. 30 tablet 0  . metoprolol succinate (TOPROL-XL) 50 MG 24 hr tablet Take 1 tablet (50  mg total) by mouth daily. Please make yearly appt with Dr. Angelena Form for October before anymore refills. 1st attempt 90 tablet 0  . aspirin 81 MG EC tablet Take 1 tablet (81 mg total) by mouth daily. 30 tablet 11  . omeprazole (PRILOSEC) 20 MG capsule Take 1 mg by mouth daily.     No facility-administered medications prior to visit.    Review of Systems  Review of Systems  Constitutional: Negative.  Negative for chills, diaphoresis, fever and unexpected weight change.  Respiratory: Positive for shortness of breath. Negative for cough, chest tightness and wheezing.     Physical Exam  BP 128/74 (BP Location: Left Arm, Cuff Size: Normal)   Pulse (!) 55   Temp 97.6 F (36.4 C) (Oral)   Ht _0  (1.651 m)   Wt 190 lb (86.2 kg)   SpO2 95%   BMI 31.62 kg/m  Physical Exam Constitutional:      Appearance: Normal appearance.     Comments: Well appearing; spanish speaking only  HENT:     Head: Normocephalic and atraumatic.     Mouth/Throat:     Mouth: Mucous membranes are moist.     Pharynx: Oropharynx is clear.  Cardiovascular:     Rate and Rhythm: Normal rate and regular rhythm.  Pulmonary:     Effort: Pulmonary effort is normal.     Breath sounds: Normal breath sounds. No wheezing or rhonchi.  Skin:    General: Skin is warm and dry.  Neurological:     General: No focal deficit present.     Mental Status: She is alert and oriented to person, place, and time. Mental status is at baseline.  Psychiatric:        Mood and Affect: Mood normal.        Behavior: Behavior normal.        Thought Content: Thought content normal.        Judgment: Judgment normal.      Lab Results:  CBC    Component Value Date/Time   WBC 7.5 06/20/2019 1556   RBC 5.43 (H) 06/20/2019 1556   HGB 15.4 (H) 06/20/2019 1556   HCT 47.3 (H) 06/20/2019 1556   PLT 225 06/20/2019 1556   MCV 87.1 06/20/2019 1556   MCH 28.4 06/20/2019 1556   MCHC 32.6 06/20/2019 1556   RDW 12.6 06/20/2019 1556    LYMPHSABS 4.2 (H) 11/02/2015 0425   MONOABS 0.6 11/02/2015 0425   EOSABS 0.2 11/02/2015 0425   BASOSABS 0.0 11/02/2015 0425    BMET    Component Value Date/Time   NA 139 06/20/2019 1556   NA 143 04/28/2019 1534   K 3.8 06/20/2019 1556   CL 103 06/20/2019 1556   CO2 25 06/20/2019 1556   GLUCOSE 112 (H) 06/20/2019 1556   BUN 13 06/20/2019 1556   BUN 19 04/28/2019 1534   CREATININE 0.81 06/20/2019 1556   CALCIUM 8.8 (L) 06/20/2019 1556   GFRNONAA >60 06/20/2019 1556   GFRAA >60 06/20/2019 1556    BNP    Component Value Date/Time   BNP 272.0 (  H) 11/02/2015 0425    ProBNP No results found for: PROBNP  Imaging: CT CHEST W CONTRAST  Result Date: 04/27/2020 CLINICAL DATA:  Abnormal chest x-ray. Multiple lung nodules. EXAM: CT CHEST WITH CONTRAST TECHNIQUE: Multidetector CT imaging of the chest was performed during intravenous contrast administration. CONTRAST:  54m ISOVUE-300 IOPAMIDOL (ISOVUE-300) INJECTION 61% COMPARISON:  Chest CT 07/09/2019 FINDINGS: Cardiovascular: The heart is normal in size and stable. No pericardial effusion. Moderate tortuosity and atherosclerotic disease involving the thoracic aorta most notably the descending thoracic aorta. No focal thoracic aneurysm or evidence of dissection. The aortic branch vessels are patent. Stable scattered coronary artery calcifications. The pulmonary arteries appear normal. Mediastinum/Nodes: Stable mediastinal and hilar lymph nodes. No new or progressive findings. Stable 11.5 mm precarinal lymph node on image 53/3 the esophagus is grossly normal. Lungs/Pleura: Stable underlying emphysematous changes and areas of pulmonary scarring. Lobulated irregular biapical soft tissue masses are unchanged and demonstrate moderate low-attenuation and scattered calcifications. Findings most likely chronic post infectious changes. No worrisome pulmonary lesions or new pulmonary nodules. No acute overlying pulmonary process. Upper Abdomen: No  significant upper abdominal findings. Stable fatty infiltration of the liver. Slight interval enlargement of the left hepatic lobe cyst. It measures a maximum of 8.0 x 7.6 cm and previously measured 7.0 x 6.6 cm. Upper abdominal aortic aneurysm measures a maximum of 4.6 x 3.5 cm and is stable. Upper pole right renal calculi noted. Musculoskeletal: New left thyroid lesion measuring 21.5 mm. It measures as simple fluid and is consistent with a benign cyst. Not clinically significant; no follow-up imaging recommended (Ref: J Am Coll Radiol. 2015 Feb;12(2): 143-50). The bony thorax is intact. IMPRESSION: 1. Stable lobulated irregular biapical soft tissue masses, likely chronic post infectious changes. No new or progressive findings. 2. Stable mediastinal and hilar lymph nodes. 3. Stable emphysematous changes and pulmonary scarring. 4. Stable upper abdominal aortic aneurysm. 5. Slight interval enlargement of the simple appearing left hepatic lobe cyst. Aortic Atherosclerosis (ICD10-I70.0) and Emphysema (ICD10-J43.9). Aortic Atherosclerosis (ICD10-I70.0) and Emphysema (ICD10-J43.9). Electronically Signed   By: PMarijo SanesM.D.   On: 04/27/2020 12:14     Assessment & Plan:   Mycobacterium avium-intracellulare infection ([place] - Stopped MAI RX around January 2018, patient remains asymptomatic  - CT chest 04/27/20 showed unchanged irregular biapical soft tissue masses. Findings most likely chronic post infectious changes. No worrisome pulmonary lesions or new pulmonary nodules  - Continue to monitor clinically for worsening symptoms - FU in 6 months with Dr. WMelvyn Novas     EMartyn Ehrich NP 05/15/2020

## 2020-05-15 NOTE — Patient Instructions (Addendum)
Pleasure seeing you today Brooke Mora  CT chest showed unchanged irregular biapical soft tissue masses. Findings most likely chronic post infectious changes. No worrisome pulmonary lesions or new pulmonary nodules . Because you are largely asymptomatic would not recommend treatment at this time.  Please monitor for increased cough, purulent mucus, weight loss or fevers and notify office if you develop any of these  Follow-up 6 months with Dr. Melvyn Novas to monitor clinical symptoms   ________________________________________________________________________________  Es un placer verte hoy Brooke Mora  La TC de trax mostr masas de partes blandas biapicales irregulares sin cambios. Lo Brooke probable es que se produzcan cambios crnicos post infecciosos Sin lesiones pulmonares preocupantes ni nuevos ndulos pulmonares. Debido a que est mayormente asintomtico, no recomendara Lawyer.  Controle el aumento de tos, mucosidad [place], prdida de [place] o [place] y notifique a la oficina si desarrolla alguno de [place]  Seguimiento 6 meses con el Dr. Melvyn Novas para monitorear los sntomas clnicos

## 2020-05-15 NOTE — Assessment & Plan Note (Addendum)
-   Stopped MAI RX around January 2018, patient remains asymptomatic  - CT chest 04/27/20 showed unchanged irregular biapical soft tissue masses. Findings most likely chronic post infectious changes. No worrisome pulmonary lesions or new pulmonary nodules  - Continue to monitor clinically for worsening symptoms - FU in 6 months with Dr. Melvyn Novas

## 2020-06-20 ENCOUNTER — Other Ambulatory Visit: Payer: Self-pay

## 2020-06-20 MED ORDER — METOPROLOL SUCCINATE ER 50 MG PO TB24: 50.0000 mg | ORAL_TABLET | Freq: Every day | ORAL | 0 refills | Status: DC

## 2020-08-08 ENCOUNTER — Other Ambulatory Visit: Payer: Self-pay | Admitting: Cardiovascular Disease

## 2020-11-15 ENCOUNTER — Ambulatory Visit: Payer: Medicaid Other | Admitting: Internal Medicine

## 2020-11-15 NOTE — Progress Notes (Deleted)
Subjective:    Patient ID: Brooke Mora, female    DOB: August 21, 1929 .   MRN: [id number]     Brief patient profile: 87yo latino female quit smoking 2008 previously followed by Dr Ashok Cordia for MPN's/ MAI with last entry 06/22/17:  1. NTM Infection: Currently off treatment.since early 2018 2. Bilateral upper lobe nodules: Checking CT chest without contrast. 3. Pulmonary emphysema: Remains asymptomatic off medication.   rec  Please call  Dr. [name] (ID Specialist) if you have any new breathing problems, cough, or fever/chills/sweats.  I have sent a message to Dr. [name] to have his office reach out at your new number to schedule a follow-up.  We will repeat a CT of your chest to see if the nodules in your lungs are growing or regressing.     12/07/2017  f/u ov/Brooke Mora re: transition of care re MPNs  / MAI prior rx stopped around 10/2016 Chief Complaint  Patient presents with  . Follow-up    Getting over a cold and has occ non prod cough. Breathing is overall doing well.    Since stopped TB rx > 1  Year prior to OV   Dyspnea:  MMRC2 = can't walk a nl pace on a flat grade s sob but does fine slow and flat slows down more due to knee R Cough: sporadic/ daytime/ dry no better on rx for MAI as off  Sleep: fine Cp R flank x 6-7 years / hurts to sleep on side/ "not really pain" / not pleuritic / no change with eating/ urine   11/15/2020  f/u ov/[place] Mages re:  No chief complaint on file.    Dyspnea:  *** Cough: *** Sleeping: *** SABA use: *** 02: ***   No obvious day to day or daytime variability or assoc excess/ purulent sputum or mucus plugs or hemoptysis or cp or chest tightness, subjective wheeze or overt sinus or hb symptoms.   *** without nocturnal  or early am exacerbation  of respiratory  c/o's or need for noct saba. Also denies any obvious fluctuation of symptoms with weather or environmental changes or other aggravating or alleviating factors except as outlined above   No  unusual exposure hx or h/o childhood pna/ asthma or knowledge of premature birth.  Current Allergies, Complete Past Medical History, Past Surgical History, Family History, and Social History were reviewed in [employer] record.  ROS  The following are not active complaints unless bolded Hoarseness, sore throat, dysphagia, dental problems, itching, sneezing,  nasal congestion or discharge of excess mucus or purulent secretions, ear ache,   fever, chills, sweats, unintended wt loss or wt gain, classically pleuritic or exertional cp,  orthopnea pnd or arm/hand swelling  or leg swelling, presyncope, palpitations, abdominal pain, anorexia, nausea, vomiting, diarrhea  or change in bowel habits or change in bladder habits, change in stools or change in urine, dysuria, hematuria,  rash, arthralgias, visual complaints, headache, numbness, weakness or ataxia or problems with walking or coordination,  change in mood or  memory.        No outpatient medications have been marked as taking for the 11/15/20 encounter (Appointment) with Tanda Rockers, MD.                    Objective:   Physical Exam        11/15/2020       ***  12/07/17 177 lb 6.4 oz (80.5 kg)  06/22/17 181 lb 9.6 oz (82.4  kg)  12/01/16 181 lb (82.1 kg)      Vital signs reviewed  11/15/2020  - Note at rest 02 sats  ***% on ***   General appearance:    ***     PFT 12/21/15:  FVC 2.31 L (91%) FEV1 1.78 L (94%) FEV1/FVC 0.77 FEF 25-75 1.63 L (131%) no bronchodilator response TLC 10.16 L (204%) RV 329% ERV 131% (patient unable to perform DLCO)   PATHOLOGY RUL BAL (11/05/15): No malignancy.  MICROBIOLOGY: RUL BAL (1/9) AFB: Smear Negative / Ctx negative Fungal: Smear Negative / Ctx negative Routine: Normal oral flora  Sputum AFB (11/02/15): Smear Negative / MAC on culture  Quantiferon-TB (11/02/15): Negative Blood Ctx x2 (11/02/15): Negative                 Assessment & Plan:

## 2020-11-20 ENCOUNTER — Other Ambulatory Visit: Payer: Self-pay

## 2020-11-20 ENCOUNTER — Emergency Department (HOSPITAL_BASED_OUTPATIENT_CLINIC_OR_DEPARTMENT_OTHER): Payer: Medicaid Other

## 2020-11-20 ENCOUNTER — Encounter (HOSPITAL_BASED_OUTPATIENT_CLINIC_OR_DEPARTMENT_OTHER): Payer: Self-pay | Admitting: *Deleted

## 2020-11-20 ENCOUNTER — Inpatient Hospital Stay (HOSPITAL_BASED_OUTPATIENT_CLINIC_OR_DEPARTMENT_OTHER)
Admission: EM | Admit: 2020-11-20 | Discharge: 2020-12-04 | DRG: 177 | Disposition: A | Discharge status: Home Health | Payer: Medicaid Other | Attending: Internal Medicine | Admitting: Internal Medicine

## 2020-11-20 DIAGNOSIS — G4733 Obstructive sleep apnea (adult) (pediatric): Secondary | ICD-10-CM | POA: Diagnosis present

## 2020-11-20 DIAGNOSIS — R7303 Prediabetes: Secondary | ICD-10-CM | POA: Diagnosis present

## 2020-11-20 DIAGNOSIS — E7800 Pure hypercholesterolemia, unspecified: Secondary | ICD-10-CM | POA: Diagnosis present

## 2020-11-20 DIAGNOSIS — U071 COVID-19: Principal | ICD-10-CM | POA: Diagnosis present

## 2020-11-20 DIAGNOSIS — I10 Essential (primary) hypertension: Secondary | ICD-10-CM | POA: Diagnosis present

## 2020-11-20 DIAGNOSIS — J1282 Pneumonia due to coronavirus disease 2019: Secondary | ICD-10-CM | POA: Diagnosis present

## 2020-11-20 DIAGNOSIS — J984 Other disorders of lung: Secondary | ICD-10-CM

## 2020-11-20 DIAGNOSIS — K219 Gastro-esophageal reflux disease without esophagitis: Secondary | ICD-10-CM | POA: Diagnosis present

## 2020-11-20 DIAGNOSIS — J9601 Acute respiratory failure with hypoxia: Secondary | ICD-10-CM | POA: Diagnosis present

## 2020-11-20 DIAGNOSIS — R0602 Shortness of breath: Secondary | ICD-10-CM

## 2020-11-20 DIAGNOSIS — Z833 Family history of diabetes mellitus: Secondary | ICD-10-CM

## 2020-11-20 DIAGNOSIS — Z87891 Personal history of nicotine dependence: Secondary | ICD-10-CM

## 2020-11-20 DIAGNOSIS — R911 Solitary pulmonary nodule: Secondary | ICD-10-CM | POA: Diagnosis present

## 2020-11-20 DIAGNOSIS — Z79899 Other long term (current) drug therapy: Secondary | ICD-10-CM

## 2020-11-20 DIAGNOSIS — T380X5A Adverse effect of glucocorticoids and synthetic analogues, initial encounter: Secondary | ICD-10-CM | POA: Diagnosis present

## 2020-11-20 DIAGNOSIS — J438 Other emphysema: Secondary | ICD-10-CM | POA: Diagnosis present

## 2020-11-20 DIAGNOSIS — R0902 Hypoxemia: Secondary | ICD-10-CM

## 2020-11-20 HISTORY — DX: Emphysema, unspecified: J43.9

## 2020-11-20 LAB — CBC WITH DIFFERENTIAL/PLATELET
Abs Immature Granulocytes: 0.03 10*3/uL (ref 0.00–0.07)
Basophils Absolute: 0 10*3/uL (ref 0.0–0.1)
Basophils Relative: 0 %
Eosinophils Absolute: 0 10*3/uL (ref 0.0–0.5)
Eosinophils Relative: 0 %
HCT: 44.9 % (ref 36.0–46.0)
Hemoglobin: 14.7 g/dL (ref 12.0–15.0)
Immature Granulocytes: 1 %
Lymphocytes Relative: 25 %
Lymphs Abs: 1.2 10*3/uL (ref 0.7–4.0)
MCH: 28.3 pg (ref 26.0–34.0)
MCHC: 32.7 g/dL (ref 30.0–36.0)
MCV: 86.5 fL (ref 80.0–100.0)
Monocytes Absolute: 0.4 10*3/uL (ref 0.1–1.0)
Monocytes Relative: 8 %
Neutro Abs: 3.1 10*3/uL (ref 1.7–7.7)
Neutrophils Relative %: 66 %
Platelets: 141 10*3/uL — ABNORMAL LOW (ref 150–400)
RBC: 5.19 MIL/uL — ABNORMAL HIGH (ref 3.87–5.11)
RDW: 12.7 % (ref 11.5–15.5)
WBC: 4.8 10*3/uL (ref 4.0–10.5)
nRBC: 0 % (ref 0.0–0.2)

## 2020-11-20 LAB — COMPREHENSIVE METABOLIC PANEL
ALT: 18 U/L (ref 0–44)
AST: 30 U/L (ref 15–41)
Albumin: 3.6 g/dL (ref 3.5–5.0)
Alkaline Phosphatase: 67 U/L (ref 38–126)
Anion gap: 10 (ref 5–15)
BUN: 16 mg/dL (ref 8–23)
CO2: 23 mmol/L (ref 22–32)
Calcium: 8.1 mg/dL — ABNORMAL LOW (ref 8.9–10.3)
Chloride: 100 mmol/L (ref 98–111)
Creatinine, Ser: 0.96 mg/dL (ref 0.44–1.00)
GFR, Estimated: 57 mL/min — ABNORMAL LOW (ref >60)
Glucose, Bld: 135 mg/dL — ABNORMAL HIGH (ref 70–99)
Potassium: 3.9 mmol/L (ref 3.5–5.1)
Sodium: 133 mmol/L — ABNORMAL LOW (ref 135–145)
Total Bilirubin: 0.2 mg/dL — ABNORMAL LOW (ref 0.3–1.2)
Total Protein: 7.2 g/dL (ref 6.5–8.1)

## 2020-11-20 LAB — D-DIMER, QUANTITATIVE: D-Dimer, Quant: 1.62 ug/mL-FEU — ABNORMAL HIGH (ref 0.00–0.50)

## 2020-11-20 LAB — SARS CORONAVIRUS 2 BY RT PCR (HOSPITAL ORDER, PERFORMED IN ~~LOC~~ HOSPITAL LAB): SARS Coronavirus 2: POSITIVE — AB

## 2020-11-20 LAB — TROPONIN I (HIGH SENSITIVITY): Troponin I (High Sensitivity): 13 ng/L (ref <18)

## 2020-11-20 LAB — BRAIN NATRIURETIC PEPTIDE: B Natriuretic Peptide: 72 pg/mL (ref 0.0–100.0)

## 2020-11-20 MED ORDER — SODIUM CHLORIDE 0.9 % IV SOLN
100.0000 mg | Freq: Every day | INTRAVENOUS | Status: AC
  Administered 2020-11-22 – 2020-11-25 (×4): 100 mg via INTRAVENOUS
  Filled 2020-11-20 (×5): qty 20

## 2020-11-20 MED ORDER — IOHEXOL 350 MG/ML SOLN
100.0000 mL | Freq: Once | INTRAVENOUS | Status: AC | PRN
  Administered 2020-11-20: 100 mL via INTRAVENOUS

## 2020-11-20 MED ORDER — SODIUM CHLORIDE 0.9 % IV SOLN
100.0000 mg | INTRAVENOUS | Status: AC
  Administered 2020-11-20 – 2020-11-21 (×2): 100 mg via INTRAVENOUS
  Filled 2020-11-20: qty 20

## 2020-11-20 MED ORDER — DEXAMETHASONE SODIUM PHOSPHATE 10 MG/ML IJ SOLN
8.0000 mg | Freq: Once | INTRAMUSCULAR | Status: AC
  Administered 2020-11-20: 8 mg via INTRAVENOUS
  Filled 2020-11-20: qty 1

## 2020-11-20 MED ORDER — ACETAMINOPHEN 325 MG PO TABS
650.0000 mg | ORAL_TABLET | Freq: Once | ORAL | Status: AC
  Administered 2020-11-20: 650 mg via ORAL
  Filled 2020-11-20: qty 2

## 2020-11-20 NOTE — ED Triage Notes (Signed)
Covid exposure 2 weeks ago. Hx emphysema. Fatigue and cough a week ago. She was given zithromax x 4 days with some improvement. For the past 4 days she has had no appetite. She has not been tested for Covid. Her PO per granddaughter has been 82-84% at home.

## 2020-11-20 NOTE — ED Notes (Signed)
Pt placed on oxygen 4L on arrival to room with improvement in sat to 93%

## 2020-11-20 NOTE — ED Provider Notes (Signed)
[place] EMERGENCY DEPARTMENT Provider Note   CSN: [id number] Arrival date & time: 11/20/20  2122     History Chief Complaint  Patient presents with  . Hypoxic    Brooke Mora is a 87 y.o. female.  Level 5 caveat for language barrier.  Translator used.  Patient from home with a 4-Mora history of decreased appetite and fatigue.  Multiple family members have tested positive for Covid in the past 2 weeks.  Patient is vaccinated.  Granddaughter at bedside today states patient has had no appetite for the past 3 to 4 days and checked her pulse ox at home as well as in the 80s.  She was told to come to the ED for hypoxia.  Patient does not normally wear oxygen at home and is not using inhalers.  She has a history of COPD as well as Mycobacterium infection.  She normally does not take any medications for her breathing.  Is Covid vaccinated.  Patient states she is breathing the same as always.  Denies any chest pain or abdominal pain.  No fever.  No leg pain or leg swelling.  No cough.  Patient was on Zithromax for several days but has not improved in terms of her fatigue and poor appetite. Daughter states that patient does not normally take any medications for her breathing because she has not had any issues for several years.  The history is provided by the patient. A language interpreter was used.       Past Medical History:  Diagnosis Date  . COPD (chronic obstructive pulmonary disease) (HCC)    slight   . Emphysema lung ([place])   . GERD (gastroesophageal reflux disease)   . High cholesterol   . Hypertension    patient states b/p up and down not on meds  . Kidney stones   . SVT (supraventricular tachycardia) [facility])     Patient Active Problem List   Diagnosis Date Noted  . 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 HTN (hypertension) 12/24/2015  . GERD (gastroesophageal reflux disease) 12/24/2015  . Nephrolithiasis 12/24/2015  . COPD exacerbation  ([place]) 12/24/2015  . Former cigarette smoker 12/24/2015  . Hyperglycemia 12/24/2015  . Dyslipidemia 12/24/2015  . SVT (supraventricular tachycardia) ([place]) 11/01/2015  . Cavitary lesion of lung 11/01/2015  . Incisional hernia, without obstruction or gangrene, incarcerated 06/12/2014    Past Surgical History:  Procedure Laterality Date  . ABDOMINAL HYSTERECTOMY    . CATARACT EXTRACTION    . HERNIA REPAIR    . INCISIONAL HERNIA REPAIR N/A 07/18/2014   Procedure: REPAIR OF INACERATED INCISIONAL HERNIA WITH MESH;  Surgeon: Armandina Gemma, MD;  Location: [facility];  Service: General;  Laterality: N/A;  . INSERTION OF MESH N/A 07/18/2014   Procedure: INSERTION OF MESH;  Surgeon: Armandina Gemma, MD;  Location: [facility];  Service: General;  Laterality: N/A;  . KNEE SURGERY    . VIDEO BRONCHOSCOPY Bilateral 11/05/2015   Procedure: VIDEO BRONCHOSCOPY WITHOUT FLUORO;  Surgeon: Javier Glazier, MD;  Location: [facility];  Service: Cardiopulmonary;  Laterality: Bilateral;     OB History   No obstetric history on file.     Family History  Problem Relation Age of Onset  . Diabetes Mother     Social History   Tobacco Use  . Smoking status: Former Smoker    Packs/Mora: 0.50    Years: 60.00    Pack years: 30.00    Types: Cigarettes    Quit date: 10/27/2006  Years since quitting: 14.0  . Smokeless tobacco: Never Used  Vaping Use  . Vaping Use: Never used  Substance Use Topics  . Alcohol use: No    Alcohol/week: 0.0 standard drinks  . Drug use: No    Home Medications Prior to Admission medications   Medication Sig Start Date End Date Taking? Authorizing Provider  metoprolol succinate (TOPROL-XL) 50 MG 24 hr tablet Take 1 tablet (50 mg total) by mouth daily. Pt needs to make appt with provider for further refills - 1st attempt 08/08/20  Yes Burnell Blanks, MD  gabapentin (NEURONTIN) 300 MG capsule Take 1 mg by mouth daily. 05/11/18   [provider]  ibuprofen (ADVIL) 400 MG  tablet Take 1 tablet (400 mg total) by mouth every 8 (eight) hours as needed. 06/20/19   Charlesetta Shanks, MD    Allergies    Nabumetone  Review of Systems   Review of Systems  Constitutional: Positive for fatigue. Negative for activity change, appetite change and fever.  HENT: Positive for congestion. Negative for rhinorrhea.   Respiratory: Positive for cough and shortness of breath. Negative for chest tightness.   Cardiovascular: Negative for chest pain and leg swelling.  Gastrointestinal: Negative for abdominal pain, nausea and vomiting.  Genitourinary: Negative for dysuria and hematuria.  Musculoskeletal: Negative for arthralgias and myalgias.  Skin: Negative for rash.  Neurological: Negative for dizziness, weakness and headaches.  Hematological: Bruises/bleeds easily: .ro.   all other systems are negative except as noted in the HPI and PMH.    Physical Exam Updated Vital Signs BP (!) 147/67   Pulse 70   Resp 12   Ht 5\' 5"  (1.651 m)   Wt 83.8 kg   SpO2 96%   BMI 30.74 kg/m   Physical Exam Vitals and nursing note reviewed.  Constitutional:      General: She is in acute distress.     Appearance: She is well-developed and well-nourished.     Comments: Mild increased work of breathing  HENT:     Head: Normocephalic and atraumatic.     Mouth/Throat:     Mouth: Oropharynx is clear and moist.     Pharynx: No oropharyngeal exudate.  Eyes:     Extraocular Movements: EOM normal.     Conjunctiva/sclera: Conjunctivae normal.     Pupils: Pupils are equal, round, and reactive to light.  Neck:     Comments: No meningismus. Cardiovascular:     Rate and Rhythm: Normal rate and regular rhythm.     Pulses: Intact distal pulses.     Heart sounds: Normal heart sounds. No murmur heard.   Pulmonary:     Effort: Pulmonary effort is normal. No respiratory distress.     Breath sounds: Normal breath sounds. No wheezing.  Chest:     Chest wall: No tenderness.  Abdominal:      Palpations: Abdomen is soft.     Tenderness: There is no abdominal tenderness. There is no guarding or rebound.  Musculoskeletal:        General: No tenderness or edema. Normal range of motion.     Cervical back: Normal range of motion and neck supple.  Skin:    General: Skin is warm.  Neurological:     Mental Status: She is alert and oriented to person, place, and time.     Cranial Nerves: No cranial nerve deficit.     Motor: No abnormal muscle tone.     Coordination: Coordination normal.     Comments:  5/5 strength throughout. CN 2-12 intact.Equal grip strength.   Psychiatric:        Mood and Affect: Mood and affect normal.        Behavior: Behavior normal.     ED Results / Procedures / Treatments   Labs (all labs ordered are listed, but only abnormal results are displayed) Labs Reviewed  SARS CORONAVIRUS 2 BY RT PCR ([place] [place] [facility]) - Abnormal; Notable for the following components:      Result Value   SARS Coronavirus 2 POSITIVE (*)    All other components within normal limits  CBC WITH DIFFERENTIAL/PLATELET - Abnormal; Notable for the following components:   RBC 5.19 (*)    Platelets 141 (*)    All other components within normal limits  COMPREHENSIVE METABOLIC PANEL - Abnormal; Notable for the following components:   Sodium 133 (*)    Glucose, Bld 135 (*)    Calcium 8.1 (*)    Total Bilirubin 0.2 (*)    GFR, Estimated 57 (*)    All other components within normal limits  D-DIMER, QUANTITATIVE (NOT AT [facility]) - Abnormal; Notable for the following components:   D-Dimer, Quant 1.62 (*)    All other components within normal limits  CULTURE, BLOOD (ROUTINE X 2)  CULTURE, BLOOD (ROUTINE X 2)  BRAIN NATRIURETIC PEPTIDE  LACTIC ACID, PLASMA  LACTIC ACID, PLASMA  PROCALCITONIN  LACTATE DEHYDROGENASE  FERRITIN  TRIGLYCERIDES  FIBRINOGEN  C-REACTIVE PROTEIN  TROPONIN I (HIGH SENSITIVITY)  TROPONIN I (HIGH SENSITIVITY)    EKG EKG  Interpretation  Date/Time:  Tuesday November 20 2020 21:36:27 EST Ventricular Rate:  70 PR Interval:    QRS Duration: 106 QT Interval:  387 QTC Calculation: 418 R Axis:   -18 Text Interpretation: Sinus rhythm Borderline left axis deviation Low voltage, precordial leads No significant change was found Confirmed by Ezequiel Essex [phone]) on 11/20/2020 9:42:11 PM   Radiology DG Chest Portable 1 View  Result Date: 11/20/2020 CLINICAL DATA:  Hypoxia. EXAM: PORTABLE CHEST 1 VIEW COMPARISON:  Chest radiograph dated 12/07/2017 and CT dated 04/27/2020. FINDINGS: Chronic bilateral upper lobe densities as seen on the prior radiograph and CT. No new consolidation. There is no pleural effusion pneumothorax. The cardiac silhouette is within limits. Atherosclerotic calcification of the aorta. Degenerative changes of the shoulders. No acute osseous pathology. IMPRESSION: No acute cardiopulmonary process. Electronically Signed   By: Anner Crete M.D.   On: 11/20/2020 22:01    Procedures .Critical Care Performed by: Ezequiel Essex, MD Authorized by: Ezequiel Essex, MD   Critical care provider statement:    Critical care time (minutes):  35   Critical care was necessary to treat or prevent imminent or life-threatening deterioration of the following conditions:  Respiratory failure   Critical care was time spent personally by me on the following activities:  Discussions with consultants, evaluation of patient's response to treatment, examination of patient, ordering and performing treatments and interventions, ordering and review of laboratory studies, ordering and review of radiographic studies, pulse oximetry, re-evaluation of patient's condition, obtaining history from patient or surrogate and review of old charts     Medications Ordered in ED Medications - No data to display  ED Course  I have reviewed the triage vital signs and the nursing notes.  Pertinent labs & imaging results that  were available during my care of the patient were reviewed by me and considered in my medical decision making (see chart for details).  MDM Rules/Calculators/A&P                         Patient presents with a 4-Mora history of fatigue, anorexia and hypoxia at home.  Recent Covid exposures at home.  Patient states she does not feel any more short of breath than usual.  Her lungs are clear without wheezing.  She does have a new oxygen requirement of 4 L was satting 70% on room air on arrival.  Lungs are clear without wheezing.  Chest x-ray is negative.  Patient started on empiric steroids.  Covid test is positive.  EKG is nonischemic and troponin is negative.  D-dimer elevated at 1.6.  Patient does have history of previous Mycobacterium infection but is no longer on any medications.  She will require admission due to her new onset hypoxia but she is not in any significant respiratory distress.  Saturating well on 5 L nasal cannula no distress.  Admission discussed with Dr. Normand Sloop.  He agrees with steroids and remdesivir.  He will arrange for admission to the hospital service.  CT scan pending to rule out pulmonary embolism.   Brooke Mora was evaluated in Emergency Department on 11/20/2020 for the symptoms described in the history of present illness. She was evaluated in the context of the global COVID-19 pandemic, which necessitated consideration that the patient might be at risk for infection with the SARS-CoV-2 virus that causes COVID-19. Institutional protocols and algorithms that pertain to the evaluation of patients at risk for COVID-19 are in a state of rapid change based on information released by regulatory bodies including the CDC and federal and state organizations. These policies and algorithms were followed during the patient's care in the ED.   Final Clinical Impression(s) / ED Diagnoses Final diagnoses:  Acute hypoxemic respiratory failure ([place])  COVID-19    Rx / DC Orders ED  Discharge Orders    None       Jazariah Teall, Annie Main, MD 11/20/20 2322

## 2020-11-21 DIAGNOSIS — T380X5A Adverse effect of glucocorticoids and synthetic analogues, initial encounter: Secondary | ICD-10-CM | POA: Diagnosis present

## 2020-11-21 DIAGNOSIS — R7989 Other specified abnormal findings of blood chemistry: Secondary | ICD-10-CM | POA: Diagnosis not present

## 2020-11-21 DIAGNOSIS — Z79899 Other long term (current) drug therapy: Secondary | ICD-10-CM | POA: Diagnosis not present

## 2020-11-21 DIAGNOSIS — J984 Other disorders of lung: Secondary | ICD-10-CM

## 2020-11-21 DIAGNOSIS — J9601 Acute respiratory failure with hypoxia: Secondary | ICD-10-CM

## 2020-11-21 DIAGNOSIS — J1282 Pneumonia due to coronavirus disease 2019: Secondary | ICD-10-CM | POA: Diagnosis present

## 2020-11-21 DIAGNOSIS — U071 COVID-19: Secondary | ICD-10-CM | POA: Diagnosis present

## 2020-11-21 DIAGNOSIS — J438 Other emphysema: Secondary | ICD-10-CM | POA: Diagnosis present

## 2020-11-21 DIAGNOSIS — I10 Essential (primary) hypertension: Secondary | ICD-10-CM | POA: Diagnosis present

## 2020-11-21 DIAGNOSIS — Z87891 Personal history of nicotine dependence: Secondary | ICD-10-CM | POA: Diagnosis not present

## 2020-11-21 DIAGNOSIS — K219 Gastro-esophageal reflux disease without esophagitis: Secondary | ICD-10-CM | POA: Diagnosis present

## 2020-11-21 DIAGNOSIS — E7800 Pure hypercholesterolemia, unspecified: Secondary | ICD-10-CM | POA: Diagnosis present

## 2020-11-21 DIAGNOSIS — G4733 Obstructive sleep apnea (adult) (pediatric): Secondary | ICD-10-CM | POA: Diagnosis present

## 2020-11-21 DIAGNOSIS — R911 Solitary pulmonary nodule: Secondary | ICD-10-CM | POA: Diagnosis present

## 2020-11-21 DIAGNOSIS — Z833 Family history of diabetes mellitus: Secondary | ICD-10-CM | POA: Diagnosis not present

## 2020-11-21 DIAGNOSIS — R7303 Prediabetes: Secondary | ICD-10-CM | POA: Diagnosis present

## 2020-11-21 LAB — CBC WITH DIFFERENTIAL/PLATELET
Abs Immature Granulocytes: 0.01 10*3/uL (ref 0.00–0.07)
Basophils Absolute: 0 10*3/uL (ref 0.0–0.1)
Basophils Relative: 0 %
Eosinophils Absolute: 0 10*3/uL (ref 0.0–0.5)
Eosinophils Relative: 0 %
HCT: 46.8 % — ABNORMAL HIGH (ref 36.0–46.0)
Hemoglobin: 14.9 g/dL (ref 12.0–15.0)
Immature Granulocytes: 0 %
Lymphocytes Relative: 22 %
Lymphs Abs: 0.8 10*3/uL (ref 0.7–4.0)
MCH: 28.2 pg (ref 26.0–34.0)
MCHC: 31.8 g/dL (ref 30.0–36.0)
MCV: 88.5 fL (ref 80.0–100.0)
Monocytes Absolute: 0.1 10*3/uL (ref 0.1–1.0)
Monocytes Relative: 3 %
Neutro Abs: 2.7 10*3/uL (ref 1.7–7.7)
Neutrophils Relative %: 75 %
Platelets: 151 10*3/uL (ref 150–400)
RBC: 5.29 MIL/uL — ABNORMAL HIGH (ref 3.87–5.11)
RDW: 12.6 % (ref 11.5–15.5)
WBC: 3.7 10*3/uL — ABNORMAL LOW (ref 4.0–10.5)
nRBC: 0 % (ref 0.0–0.2)

## 2020-11-21 LAB — COMPREHENSIVE METABOLIC PANEL
ALT: 18 U/L (ref 0–44)
AST: 25 U/L (ref 15–41)
Albumin: 3.6 g/dL (ref 3.5–5.0)
Alkaline Phosphatase: 67 U/L (ref 38–126)
Anion gap: 10 (ref 5–15)
BUN: 17 mg/dL (ref 8–23)
CO2: 24 mmol/L (ref 22–32)
Calcium: 8.1 mg/dL — ABNORMAL LOW (ref 8.9–10.3)
Chloride: 102 mmol/L (ref 98–111)
Creatinine, Ser: 0.87 mg/dL (ref 0.44–1.00)
GFR, Estimated: >60 mL/min (ref >60)
Glucose, Bld: 173 mg/dL — ABNORMAL HIGH (ref 70–99)
Potassium: 4 mmol/L (ref 3.5–5.1)
Sodium: 136 mmol/L (ref 135–145)
Total Bilirubin: 0.3 mg/dL (ref 0.3–1.2)
Total Protein: 7.1 g/dL (ref 6.5–8.1)

## 2020-11-21 LAB — LACTATE DEHYDROGENASE: LDH: 228 U/L — ABNORMAL HIGH (ref 98–192)

## 2020-11-21 LAB — FERRITIN: Ferritin: 208 ng/mL (ref 11–307)

## 2020-11-21 LAB — D-DIMER, QUANTITATIVE: D-Dimer, Quant: 1.44 ug/mL-FEU — ABNORMAL HIGH (ref 0.00–0.50)

## 2020-11-21 LAB — TROPONIN I (HIGH SENSITIVITY): Troponin I (High Sensitivity): 14 ng/L (ref <18)

## 2020-11-21 LAB — LACTIC ACID, PLASMA: Lactic Acid, Venous: 1 mmol/L (ref 0.5–1.9)

## 2020-11-21 LAB — FIBRINOGEN: Fibrinogen: 459 mg/dL (ref 210–475)

## 2020-11-21 LAB — TRIGLYCERIDES: Triglycerides: 128 mg/dL (ref <150)

## 2020-11-21 LAB — PROCALCITONIN: Procalcitonin: <0.1 ng/mL

## 2020-11-21 LAB — C-REACTIVE PROTEIN
CRP: 5.8 mg/dL — ABNORMAL HIGH (ref <1.0)
CRP: 7.1 mg/dL — ABNORMAL HIGH (ref <1.0)

## 2020-11-21 MED ORDER — ENOXAPARIN SODIUM 40 MG/0.4ML ~~LOC~~ SOLN
40.0000 mg | SUBCUTANEOUS | Status: DC
  Administered 2020-11-21 – 2020-12-03 (×13): 40 mg via SUBCUTANEOUS
  Filled 2020-11-21 (×14): qty 0.4

## 2020-11-21 MED ORDER — ACETAMINOPHEN 325 MG PO TABS
650.0000 mg | ORAL_TABLET | Freq: Four times a day (QID) | ORAL | Status: DC | PRN
  Administered 2020-11-27: 650 mg via ORAL
  Filled 2020-11-21: qty 2

## 2020-11-21 MED ORDER — METOPROLOL SUCCINATE ER 50 MG PO TB24
50.0000 mg | ORAL_TABLET | Freq: Every day | ORAL | Status: DC
  Administered 2020-11-21 – 2020-12-02 (×12): 50 mg via ORAL
  Filled 2020-11-21 (×12): qty 1

## 2020-11-21 MED ORDER — ONDANSETRON HCL 4 MG PO TABS: 4.0000 mg | ORAL_TABLET | Freq: Four times a day (QID) | ORAL | Status: DC | PRN

## 2020-11-21 MED ORDER — METHYLPREDNISOLONE SODIUM SUCC 40 MG IJ SOLR
40.0000 mg | Freq: Two times a day (BID) | INTRAMUSCULAR | Status: AC
  Administered 2020-11-21 – 2020-11-22 (×3): 40 mg via INTRAVENOUS
  Filled 2020-11-21 (×3): qty 1

## 2020-11-21 MED ORDER — GABAPENTIN 300 MG PO CAPS: 300.0000 mg | ORAL_CAPSULE | Freq: Every day | ORAL | Status: DC | PRN

## 2020-11-21 MED ORDER — DEXAMETHASONE 4 MG PO TABS: 6.0000 mg | ORAL_TABLET | ORAL | Status: DC

## 2020-11-21 MED ORDER — ONDANSETRON HCL 4 MG/2ML IJ SOLN: 4.0000 mg | Freq: Four times a day (QID) | INTRAMUSCULAR | Status: DC | PRN

## 2020-11-21 MED ORDER — BENZONATATE 100 MG PO CAPS: 200.0000 mg | ORAL_CAPSULE | Freq: Three times a day (TID) | ORAL | Status: DC | PRN

## 2020-11-21 NOTE — Plan of Care (Signed)

## 2020-11-21 NOTE — ED Notes (Signed)
Care handoff/Report given to Lenore Cordia RN. All questions answered. Report also given to [employer] who are en route.

## 2020-11-21 NOTE — ED Notes (Signed)
[facility] at Bedside; Report given to Same

## 2020-11-21 NOTE — Progress Notes (Addendum)
PROGRESS NOTE    Brooke Mora  DXI:[id number] DOB: 08/29/1929 DOA: 11/20/2020 PCP: Benito Mccreedy, MD   Chief Complaint  Patient presents with  . Hypoxic   Brief Narrative:  Brooke Mora is a 87 y.o. female with medical history significant of COPD/emphysema, HTN, prior MAI off of treatment since early 2018 (saw [name]).  Pt presents to the ED at [facility] with c/o SOB, cough.  Pt with COVID exposure 2 weeks ago.  Fatigue and cough for 1 week.  Multiple other family members positive for COVID.  Pt pulse ox at home today in the 80s prompting family to send her to ED for hypoxia.  Pt not normally on O2 at home.  Pt COVID vaccinated.  Pt is actually rather asymptomatic, and despite having O2 sats down into the 80s, says she is breathing as she always does without c/o SOB.  Denies CP, abd pain, fever (subjective), leg pain, leg swelling.  On azithromycin for past few days but no improvement in terms of fatigue and poor PO intake.  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  Assessment & Plan:   Principal Problem:   Acute hypoxemic respiratory failure due to COVID-19 [facility]) Active Problems:   Cavitary lesion of lung   HTN (hypertension)  1. COVID-19 with new O2 requirement - 1. Requiring 4 L [place] 2. CT chest with extensive bilateral ground glass pulm infiltrates, mild emphysema, tumefactive scarring at lung apices bilaterally 3. Steroids, remdesivir.  Consider actemra/baricitinib (given hx of prior MAI, discuss with ID if felt indicated), though with modest O2 requirement, will see how she does with steroids. 4. Strict I/O, daily weights 5. Prone as able, OOB, IS, flutter, therapy  COVID-19 Labs  Recent Labs    11/20/20 2200 11/20/20 2323 11/21/20 0431  DDIMER 1.62*  --  1.44*  FERRITIN  --  208  --   LDH  --  228*  --   CRP  --  5.8* 7.1*    Lab Results  Component Value Date   SARSCOV2NAA POSITIVE  (A) 11/20/2020   2. Hx MAI 1. S/p treatment with ID, Dr. [name] 2. Consider discussion with ID if considering actemra/baricitinib  3. HTN - 1. Cont home metoprolol  DVT prophylaxis: lovenox Code Status: full  Family Communication: none at bedside - granddaughter  Disposition:   Status is: Inpatient  Remains inpatient appropriate because:Inpatient level of care appropriate due to severity of illness   Dispo: The patient is from: Home              Anticipated d/c is to: Home              Anticipated d/c date is: > 3 days              Patient currently is not medically stable to d/c.   Difficult to place patient No       Consultants:   none  Procedures:  none  Antimicrobials:  Anti-infectives (From admission, onward)   Start     Dose/Rate Route Frequency Ordered Stop   11/22/20 1000  remdesivir 100 mg in sodium chloride 0.9 % 100 mL IVPB        100 mg 200 mL/hr over 30 Minutes Intravenous Daily 11/20/20 2320 11/26/20 0959   11/21/20 0000  remdesivir 100 mg in sodium chloride 0.9 % 100 mL IVPB        100 mg 200  mL/hr over 30 Minutes Intravenous Every 30 min 11/20/20 2320 11/21/20 0140         Subjective: No new complaints Interpreter used  Objective: Vitals:   11/21/20 0247 11/21/20 0815 11/21/20 1159 11/21/20 1450  BP: 126/78 (!) 148/82 (!) 143/88 137/82  Pulse: 100 63 (!) 57 78  Resp: 20 15 17  (!) 22  Temp: 98.1 F (36.7 C) 97.9 F (36.6 C) 98.3 F (36.8 C) 98.2 F (36.8 C)  TempSrc:  Oral Oral Oral  SpO2: 96% 91% 93% 97%  Weight:      Height:        Intake/Output Summary (Last 24 hours) at 11/21/2020 1843 Last data filed at 11/21/2020 1452 Gross per 24 hour  Intake 1139 ml  Output -  Net 1139 ml   Filed Weights   11/20/20 2131  Weight: 83.8 kg    Examination:  General exam: Appears calm and comfortable  Respiratory system: unlabored Cardiovascular system: RRR Gastrointestinal system: Abdomen is nondistended, soft and  nontender Central nervous system: Alert and oriented. No focal neurological deficits. Extremities: no LEE Skin: No rashes, lesions or ulcers Psychiatry: Judgement and insight appear normal. Mood & affect appropriate.     Data Reviewed: I have personally reviewed following labs and imaging studies  CBC: Recent Labs  Lab 11/20/20 2200 11/21/20 0431  WBC 4.8 3.7*  NEUTROABS 3.1 2.7  HGB 14.7 14.9  HCT 44.9 46.8*  MCV 86.5 88.5  PLT 141* 599    Basic Metabolic Panel: Recent Labs  Lab 11/20/20 2200 11/21/20 0431  NA 133* 136  K 3.9 4.0  CL 100 102  CO2 23 24  GLUCOSE 135* 173*  BUN 16 17  CREATININE 0.96 0.87  CALCIUM 8.1* 8.1*    GFR: Estimated Creatinine Clearance: 46.9 mL/min (by C-G formula based on SCr of 0.87 mg/dL).  Liver Function Tests: Recent Labs  Lab 11/20/20 2200 11/21/20 0431  AST 30 25  ALT 18 18  ALKPHOS 67 67  BILITOT 0.2* 0.3  PROT 7.2 7.1  ALBUMIN 3.6 3.6    CBG: No results for input(s): GLUCAP in the last 168 hours.   Recent Results (from the past 240 hour(s))  SARS Coronavirus 2 by RT PCR (hospital order, performed in [facility] hospital lab) Nasopharyngeal Nasopharyngeal Swab     Status: Abnormal   Collection Time: 11/20/20 10:00 PM   Specimen: Nasopharyngeal Swab  Result Value Ref Range Status   SARS Coronavirus 2 POSITIVE (A) NEGATIVE Final    Comment: RESULT CALLED TO, READ BACK BY AND VERIFIED WITH: KELLIE NEAL RN ON 11/20/20 AT 2305 [facility] (NOTE) SARS-CoV-2 target nucleic acids are DETECTED  SARS-CoV-2 RNA is generally detectable in upper respiratory specimens  during the acute phase of infection.  Positive results are indicative  of the presence of the identified virus, but do not rule out bacterial infection or co-infection with other pathogens not detected by the test.  Clinical correlation with patient history and  other diagnostic information is necessary to determine patient infection status.  The expected result is  negative.  Fact Sheet for Patients:   StrictlyIdeas.no   Fact Sheet for Healthcare Providers:   BankingDealers.co.za    This test is not yet approved or cleared by the [place] FDA and  has been authorized for detection and/or diagnosis of SARS-CoV-2 by FDA under an Emergency Use Authorization (EUA).  This EUA will remain in effect (meaning this  test can be used) for the duration of  the COVID-19  declaration under Section 564(b)(1) of the Act, 21 U.S.C. section 360-bbb-3(b)(1), unless the authorization is terminated or revoked sooner.  Performed at [facility], [address] [place], [place] [address]   Blood Culture (routine x 2)     Status: None (Preliminary result)   Collection Time: 11/20/20 11:40 PM   Specimen: BLOOD RIGHT HAND  Result Value Ref Range Status   Specimen Description BLOOD RIGHT HAND  Final   Special Requests   Final    BOTTLES DRAWN AEROBIC AND ANAEROBIC Blood Culture adequate volume Performed at [facility], [place] [address], [place],  [address]    Culture PENDING  Incomplete   Report Status PENDING  Incomplete         Radiology Studies: CT Angio Chest PE W and/or Wo Contrast  Result Date: 11/20/2020 CLINICAL DATA:  Elevated D-dimer, COVID exposure, fatigue EXAM: CT ANGIOGRAPHY CHEST WITH CONTRAST TECHNIQUE: Multidetector CT imaging of the chest was performed using the standard protocol during bolus administration of intravenous contrast. Multiplanar CT image reconstructions and MIPs were obtained to evaluate the vascular anatomy. CONTRAST:  139mL OMNIPAQUE IOHEXOL 350 MG/ML SOLN COMPARISON:  04/27/2020 FINDINGS: Cardiovascular: There is adequate opacification of the pulmonary arterial tree. No intraluminal filling defect identified to suggest acute pulmonary embolism. The central pulmonary arteries are of normal caliber. Mild coronary artery calcification. Global cardiac size  within normal limits. No pericardial effusion. Minimal atherosclerotic calcification within the thoracic aorta. No aortic aneurysm. Mediastinum/Nodes: 17 mm cyst within the left thyroid gland. Shotty mediastinal and left hilar adenopathy may be reactive in nature. No frankly pathologic thoracic adenopathy. The esophagus is unremarkable. Small hiatal hernia present. Lungs/Pleura: Mild biapical paraseptal emphysema again noted. Areas of tumefactive scarring within the lung apices bilaterally are unchanged. There is diffuse, peripheral ground-glass pulmonary infiltrate now present, most in keeping with atypical infection or inflammation and compatible with the given history of acute COVID pneumonia. No pneumothorax or pleural effusion. The central airways are widely patent. Upper Abdomen: Left hepatic lobe cyst is partially visualized. Atheromatous plaque is seen within the thoracoabdominal aorta at the diaphragmatic hiatus. No acute abnormality identified. Musculoskeletal: No lytic or blastic bone lesions are seen. Review of the MIP images confirms the above findings. IMPRESSION: No pulmonary embolism. Interval development of extensive bilateral ground-glass pulmonary infiltrates, most in keeping with atypical infection or inflammation and compatible with the given history of COVID-19 pneumonia. Extensive parenchymal involvement. Mild emphysema. Stable changes of a tumefactive scarring at the lung apices bilaterally. Aortic Atherosclerosis (ICD10-I70.0). Electronically Signed   By: Fidela Salisbury MD   On: 11/20/2020 23:39   DG Chest Portable 1 View  Result Date: 11/20/2020 CLINICAL DATA:  Hypoxia. EXAM: PORTABLE CHEST 1 VIEW COMPARISON:  Chest radiograph dated 12/07/2017 and CT dated 04/27/2020. FINDINGS: Chronic bilateral upper lobe densities as seen on the prior radiograph and CT. No new consolidation. There is no pleural effusion pneumothorax. The cardiac silhouette is within limits. Atherosclerotic  calcification of the aorta. Degenerative changes of the shoulders. No acute osseous pathology. IMPRESSION: No acute cardiopulmonary process. Electronically Signed   By: Anner Crete M.D.   On: 11/20/2020 22:01        Scheduled Meds: . dexamethasone  6 mg Oral Q24H  . enoxaparin (LOVENOX) injection  40 mg Subcutaneous Q24H  . metoprolol succinate  50 mg Oral Daily   Continuous Infusions: . [START ON 11/22/2020] remdesivir 100 mg in NS 100 mL       LOS: 0  days    Time spent: over 30 min    Fayrene Helper, MD [facility]   To contact the attending provider between 7A-7P or the covering provider during after hours 7P-7A, please log into the web site [email or web address] and access using universal [place] password for that web site. If you do not have the password, please call the hospital operator.  11/21/2020, 6:43 PM

## 2020-11-21 NOTE — H&P (Signed)
History and Physical    Brooke Mora WPV:[id number] DOB: 1929/02/02 DOA: 11/20/2020  PCP: Benito Mccreedy, MD  Patient coming from: Home  I have personally briefly reviewed patient's old medical records in [place]  Chief Complaint: Cough, hypoxia  HPI: Brooke Mora is a 87 y.o. female with medical history significant of COPD/emphysema, HTN, prior MAI off of treatment since early 2018 (saw [name]).  Pt presents to the ED at [facility] with c/o SOB, cough.  Pt with COVID exposure 2 weeks ago.  Fatigue and cough for 1 week.  Multiple other family members positive for COVID.  Pt pulse ox at home today in the 80s prompting family to send her to ED for hypoxia.  Pt not normally on O2 at home.  Pt COVID vaccinated.  Pt is actually rather asymptomatic, and despite having O2 sats down into the 80s, says she is breathing as she always does without c/o SOB.  Denies CP, abd pain, fever (subjective), leg pain, leg swelling.  On azithromycin for past few days but no improvement in terms of fatigue and poor PO intake.   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   Review of Systems: As per HPI, otherwise all review of systems negative.  Past Medical History:  Diagnosis Date  . COPD (chronic obstructive pulmonary disease) (HCC)    slight   . Emphysema lung ([place])   . GERD (gastroesophageal reflux disease)   . High cholesterol   . Hypertension    patient states b/p up and down not on meds  . Kidney stones   . SVT (supraventricular tachycardia) (HCC)     Past Surgical History:  Procedure Laterality Date  . ABDOMINAL HYSTERECTOMY    . CATARACT EXTRACTION    . HERNIA REPAIR    . INCISIONAL HERNIA REPAIR N/A 07/18/2014   Procedure: REPAIR OF INACERATED INCISIONAL HERNIA WITH MESH;  Surgeon: Armandina Gemma, MD;  Location: [facility];  Service: General;  Laterality: N/A;  . INSERTION OF MESH N/A 07/18/2014   Procedure:  INSERTION OF MESH;  Surgeon: Armandina Gemma, MD;  Location: [facility];  Service: General;  Laterality: N/A;  . KNEE SURGERY    . VIDEO BRONCHOSCOPY Bilateral 11/05/2015   Procedure: VIDEO BRONCHOSCOPY WITHOUT FLUORO;  Surgeon: Javier Glazier, MD;  Location: [place] [facility];  Service: Cardiopulmonary;  Laterality: Bilateral;     reports that she quit smoking about 14 years ago. Her smoking use included cigarettes. She has a 30.00 pack-year smoking history. She has never used smokeless tobacco. She reports that she does not drink alcohol and does not use drugs.  Allergies  Allergen Reactions  . Nabumetone Itching and Swelling    Family History  Problem Relation Age of Onset  . Diabetes Mother      Prior to Admission medications   Medication Sig Start Date End Date Taking? Authorizing Provider  acetaminophen (TYLENOL) 500 MG tablet Take 1,000 mg by mouth every 6 (six) hours as needed for moderate pain.   Yes [provider]  benzonatate (TESSALON) 200 MG capsule Take 200 mg by mouth 3 (three) times daily as needed for cough.   Yes [provider]  gabapentin (NEURONTIN) 300 MG capsule Take 300 mg by mouth daily as needed (pain). 05/11/18  Yes [provider]  ibuprofen (ADVIL) 400 MG tablet Take 1 tablet (400 mg total) by mouth every 8 (eight) hours as needed. 06/20/19  Yes Charlesetta Shanks, MD  metoprolol succinate (TOPROL-XL) 50 MG 24 hr tablet Take 1 tablet (50 mg total) by mouth daily. Pt needs to make appt with provider for further refills - 1st attempt 08/08/20  Yes Burnell Blanks, MD    Physical Exam: Vitals:   11/20/20 2210 11/20/20 2352 11/21/20 0152 11/21/20 0247  BP: (!) 145/73 (!) 134/57 (!) 113/53 126/78  Pulse: 64 60 60 100  Resp: 17 20 20 20   Temp: (!) 102 F (38.9 C) 99.4 F (37.4 C) 98.6 F (37 C) 98.1 F (36.7 C)  TempSrc: Oral Oral Oral   SpO2: 96% 97% 99% 96%  Weight:      Height:        Constitutional: NAD, calm,  comfortable Eyes: PERRL, lids and conjunctivae normal ENMT: Mucous membranes are moist. Posterior pharynx clear of any exudate or lesions.Normal dentition.  Neck: normal, supple, no masses, no thyromegaly Respiratory: clear to auscultation bilaterally, no wheezing, no crackles. Normal respiratory effort. No accessory muscle use.  Cardiovascular: Regular rate and rhythm, no murmurs / rubs / gallops. No extremity edema. 2+ pedal pulses. No carotid bruits.  Abdomen: no tenderness, no masses palpated. No hepatosplenomegaly. Bowel sounds positive.  Musculoskeletal: no clubbing / cyanosis. No joint deformity upper and lower extremities. Good ROM, no contractures. Normal muscle tone.  Skin: no rashes, lesions, ulcers. No induration Neurologic: CN 2-12 grossly intact. Sensation intact, DTR normal. Strength 5/5 in all 4.  Psychiatric: Normal judgment and insight. Alert and oriented x 3. Normal mood.    Labs on Admission: I have personally reviewed following labs and imaging studies  CBC: Recent Labs  Lab 11/20/20 2200  WBC 4.8  NEUTROABS 3.1  HGB 14.7  HCT 44.9  MCV 86.5  PLT 893*   Basic Metabolic Panel: Recent Labs  Lab 11/20/20 2200  NA 133*  K 3.9  CL 100  CO2 23  GLUCOSE 135*  BUN 16  CREATININE 0.96  CALCIUM 8.1*   GFR: Estimated Creatinine Clearance: 42.5 mL/min (by C-G formula based on SCr of 0.96 mg/dL). Liver Function Tests: Recent Labs  Lab 11/20/20 2200  AST 30  ALT 18  ALKPHOS 67  BILITOT 0.2*  PROT 7.2  ALBUMIN 3.6   No results for input(s): LIPASE, AMYLASE in the last 168 hours. No results for input(s): AMMONIA in the last 168 hours. Coagulation Profile: No results for input(s): INR, PROTIME in the last 168 hours. Cardiac Enzymes: No results for input(s): CKTOTAL, CKMB, CKMBINDEX, TROPONINI in the last 168 hours. BNP (last 3 results) No results for input(s): PROBNP in the last 8760 hours. HbA1C: No results for input(s): HGBA1C in the last 72  hours. CBG: No results for input(s): GLUCAP in the last 168 hours. Lipid Profile: Recent Labs    11/20/20 2323  TRIG 128   Thyroid Function Tests: No results for input(s): TSH, T4TOTAL, FREET4, T3FREE, THYROIDAB in the last 72 hours. Anemia Panel: Recent Labs    11/20/20 2323  FERRITIN 208   Urine analysis:    Component Value Date/Time   COLORURINE YELLOW 06/20/2019 1556   APPEARANCEUR CLOUDY (A) 06/20/2019 1556   LABSPEC 1.015 06/20/2019 1556   PHURINE 6.0 06/20/2019 1556   GLUCOSEU NEGATIVE 06/20/2019 1556   HGBUR SMALL (A) 06/20/2019 1556   BILIRUBINUR NEGATIVE 06/20/2019 1556   KETONESUR 15 (A) 06/20/2019 1556   PROTEINUR 30 (A) 06/20/2019 1556   UROBILINOGEN 0.2 06/19/2019 1549   NITRITE POSITIVE (A) 06/20/2019 1556   LEUKOCYTESUR SMALL (A) 06/20/2019  [place] on Admission: CT Angio Chest PE W and/or Wo Contrast  Result Date: 11/20/2020 CLINICAL DATA:  Elevated D-dimer, COVID exposure, fatigue EXAM: CT ANGIOGRAPHY CHEST WITH CONTRAST TECHNIQUE: Multidetector CT imaging of the chest was performed using the standard protocol during bolus administration of intravenous contrast. Multiplanar CT image reconstructions and MIPs were obtained to evaluate the vascular anatomy. CONTRAST:  131mL OMNIPAQUE IOHEXOL 350 MG/ML SOLN COMPARISON:  04/27/2020 FINDINGS: Cardiovascular: There is adequate opacification of the pulmonary arterial tree. No intraluminal filling defect identified to suggest acute pulmonary embolism. The central pulmonary arteries are of normal caliber. Mild coronary artery calcification. Global cardiac size within normal limits. No pericardial effusion. Minimal atherosclerotic calcification within the thoracic aorta. No aortic aneurysm. Mediastinum/Nodes: 17 mm cyst within the left thyroid gland. Shotty mediastinal and left hilar adenopathy may be reactive in nature. No frankly pathologic thoracic adenopathy. The esophagus is unremarkable. Small  hiatal hernia present. Lungs/Pleura: Mild biapical paraseptal emphysema again noted. Areas of tumefactive scarring within the lung apices bilaterally are unchanged. There is diffuse, peripheral ground-glass pulmonary infiltrate now present, most in keeping with atypical infection or inflammation and compatible with the given history of acute COVID pneumonia. No pneumothorax or pleural effusion. The central airways are widely patent. Upper Abdomen: Left hepatic lobe cyst is partially visualized. Atheromatous plaque is seen within the thoracoabdominal aorta at the diaphragmatic hiatus. No acute abnormality identified. Musculoskeletal: No lytic or blastic bone lesions are seen. Review of the MIP images confirms the above findings. IMPRESSION: No pulmonary embolism. Interval development of extensive bilateral ground-glass pulmonary infiltrates, most in keeping with atypical infection or inflammation and compatible with the given history of COVID-19 pneumonia. Extensive parenchymal involvement. Mild emphysema. Stable changes of a tumefactive scarring at the lung apices bilaterally. Aortic Atherosclerosis (ICD10-I70.0). Electronically Signed   By: Fidela Salisbury MD   On: 11/20/2020 23:39   DG Chest Portable 1 View  Result Date: 11/20/2020 CLINICAL DATA:  Hypoxia. EXAM: PORTABLE CHEST 1 VIEW COMPARISON:  Chest radiograph dated 12/07/2017 and CT dated 04/27/2020. FINDINGS: Chronic bilateral upper lobe densities as seen on the prior radiograph and CT. No new consolidation. There is no pleural effusion pneumothorax. The cardiac silhouette is within limits. Atherosclerotic calcification of the aorta. Degenerative changes of the shoulders. No acute osseous pathology. IMPRESSION: No acute cardiopulmonary process. Electronically Signed   By: Anner Crete M.D.   On: 11/20/2020 22:01    EKG: Independently reviewed.  Assessment/Plan Principal Problem:   Acute hypoxemic respiratory failure due to COVID-19 [facility]) Active  Problems:   Cavitary lesion of lung   HTN (hypertension)    1. COVID-19 with new O2 requirement - 1. COVID pathway 2. Cont pulse ox 3. remdesivir 4. Decadron 5. Only 2L O2 currently to maintain sats in 90s at rest, CRP 5.8.  Baricitinib felt not indiciated at this time.  This is just as well as im concerned what baricitinib may do to her prior MAI (currently inactive, treated). 1. If she ends up worsening, would call ID before starting baricitinib. 2. Cavitary lesion of lung - 1. Chronic 2. Presumably from prior treated MAI 3. HTN - 1. Cont home metoprolol  DVT prophylaxis: Lovenox Code Status: Full Family Communication: No family in room Disposition Plan: Home after admit Consults called: None Admission status: Admit to inpatient  Severity of Illness: The appropriate patient status for this patient is INPATIENT. Inpatient status is judged to be reasonable and necessary in order to provide the required intensity  of service to ensure the patient's safety. The patient's presenting symptoms, physical exam findings, and initial radiographic and laboratory data in the context of their chronic comorbidities is felt to place them at high risk for further clinical deterioration. Furthermore, it is not anticipated that the patient will be medically stable for discharge from the hospital within 2 midnights of admission. The following factors support the patient status of inpatient.   IP status due to new O2 requirement associated with COVID-19 PNA.  * I certify that at the point of admission it is my clinical judgment that the patient will require inpatient hospital care spanning beyond 2 midnights from the point of admission due to high intensity of service, high risk for further deterioration and high frequency of surveillance required.*    Milanni Ayub M. DO [facility]  How to contact the [facility] Attending or Consulting provider [place] or covering provider during after hours [place], for this patient?  1. Check the care team in [facility] and look for a) attending/consulting [facility] provider listed and b) the [facility] team listed 2. Log into [email or web address]  Amion Physician Scheduling and messaging for groups and whole hospitals  On call and physician scheduling software for group practices, residents, hospitalists and other medical providers for call, clinic, rotation and shift schedules. On[employer] is a hospital-wide system for scheduling doctors and paging doctors on call. EasyPlot is for scientific plotting and data analysis.  [email or web address]  and use [place]'s universal password to access. If you do not have the password, please contact the hospital operator.  3. Locate the [facility] provider you are looking for under [facility] and page to a number that you can be directly reached. 4. If you still have difficulty reaching the provider, please page the [facility] (Director on Call) for the Hospitalists listed on amion for assistance.  11/21/2020, 4:16 AM

## 2020-11-21 NOTE — ED Notes (Signed)
Family Representative signed Transfer Consent Documentation willingly.

## 2020-11-22 DIAGNOSIS — U071 COVID-19: Secondary | ICD-10-CM | POA: Diagnosis not present

## 2020-11-22 DIAGNOSIS — J9601 Acute respiratory failure with hypoxia: Secondary | ICD-10-CM | POA: Diagnosis not present

## 2020-11-22 LAB — CBC WITH DIFFERENTIAL/PLATELET
Abs Immature Granulocytes: 0.02 10*3/uL (ref 0.00–0.07)
Basophils Absolute: 0 10*3/uL (ref 0.0–0.1)
Basophils Relative: 0 %
Eosinophils Absolute: 0 10*3/uL (ref 0.0–0.5)
Eosinophils Relative: 0 %
HCT: 49 % — ABNORMAL HIGH (ref 36.0–46.0)
Hemoglobin: 15.7 g/dL — ABNORMAL HIGH (ref 12.0–15.0)
Immature Granulocytes: 0 %
Lymphocytes Relative: 24 %
Lymphs Abs: 1.2 10*3/uL (ref 0.7–4.0)
MCH: 28.2 pg (ref 26.0–34.0)
MCHC: 32 g/dL (ref 30.0–36.0)
MCV: 88 fL (ref 80.0–100.0)
Monocytes Absolute: 0.3 10*3/uL (ref 0.1–1.0)
Monocytes Relative: 6 %
Neutro Abs: 3.4 10*3/uL (ref 1.7–7.7)
Neutrophils Relative %: 70 %
Platelets: 213 10*3/uL (ref 150–400)
RBC: 5.57 MIL/uL — ABNORMAL HIGH (ref 3.87–5.11)
RDW: 12.6 % (ref 11.5–15.5)
WBC: 4.9 10*3/uL (ref 4.0–10.5)
nRBC: 0 % (ref 0.0–0.2)

## 2020-11-22 LAB — COMPREHENSIVE METABOLIC PANEL
ALT: 21 U/L (ref 0–44)
AST: 28 U/L (ref 15–41)
Albumin: 3.6 g/dL (ref 3.5–5.0)
Alkaline Phosphatase: 65 U/L (ref 38–126)
Anion gap: 12 (ref 5–15)
BUN: 23 mg/dL (ref 8–23)
CO2: 20 mmol/L — ABNORMAL LOW (ref 22–32)
Calcium: 8.3 mg/dL — ABNORMAL LOW (ref 8.9–10.3)
Chloride: 103 mmol/L (ref 98–111)
Creatinine, Ser: 0.95 mg/dL (ref 0.44–1.00)
GFR, Estimated: 57 mL/min — ABNORMAL LOW (ref >60)
Glucose, Bld: 172 mg/dL — ABNORMAL HIGH (ref 70–99)
Potassium: 4 mmol/L (ref 3.5–5.1)
Sodium: 135 mmol/L (ref 135–145)
Total Bilirubin: 0.4 mg/dL (ref 0.3–1.2)
Total Protein: 7.2 g/dL (ref 6.5–8.1)

## 2020-11-22 LAB — C-REACTIVE PROTEIN: CRP: 5.3 mg/dL — ABNORMAL HIGH (ref <1.0)

## 2020-11-22 LAB — PHOSPHORUS: Phosphorus: 2.9 mg/dL (ref 2.5–4.6)

## 2020-11-22 LAB — D-DIMER, QUANTITATIVE: D-Dimer, Quant: 1.53 ug/mL-FEU — ABNORMAL HIGH (ref 0.00–0.50)

## 2020-11-22 LAB — MAGNESIUM: Magnesium: 2.3 mg/dL (ref 1.7–2.4)

## 2020-11-22 MED ORDER — DEXAMETHASONE 4 MG PO TABS
6.0000 mg | ORAL_TABLET | Freq: Every day | ORAL | Status: DC
  Administered 2020-11-23: 6 mg via ORAL
  Filled 2020-11-22: qty 2

## 2020-11-22 MED ORDER — SODIUM CHLORIDE 0.9 % IV SOLN
INTRAVENOUS | Status: DC | PRN
  Administered 2020-11-22: 500 mL via INTRAVENOUS

## 2020-11-22 NOTE — Progress Notes (Addendum)
PROGRESS NOTE    Brooke Mora  PPI:[id number] DOB: [birth year] DOA: 11/20/2020 PCP: Benito Mccreedy, MD   Chief Complaint  Patient presents with  . Hypoxic   Brief Narrative:  Brooke Mora is a 87 y.o. female with medical history significant of COPD/emphysema, HTN, prior MAI off of treatment since early 2018 (saw [name]).  Pt presents to the ED at [facility] with c/o SOB, cough.  Pt with COVID exposure 2 weeks ago.  Fatigue and cough for 1 week.  Multiple other family members positive for COVID.  Pt pulse ox at home today in the 80s prompting family to send her to ED for hypoxia.  Pt not normally on O2 at home.  Pt COVID vaccinated.  Pt is actually rather asymptomatic, and despite having O2 sats down into the 80s, says she is breathing as she always does without c/o SOB.  Denies CP, abd pain, fever (subjective), leg pain, leg swelling.  On azithromycin for past few days but no improvement in terms of fatigue and poor PO intake.  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  Assessment & Plan:   Principal Problem:   Acute hypoxemic respiratory failure due to COVID-19 [facility]) Active Problems:   Cavitary lesion of lung   HTN (hypertension)  1. COVID-19 with new O2 requirement - 1. Currently requiring 1 L O2, wean as tolerated 2. CT chest with extensive bilateral ground glass pulm infiltrates, mild emphysema, tumefactive scarring at lung apices bilaterally 3. Steroids, remdesivir.  Consider actemra/baricitinib (given hx of prior MAI, discuss with ID if felt indicated), though with modest O2 requirement, will see how she does with steroids. 4. Strict I/O, daily weights 5. Prone as able, OOB, IS, flutter, therapy 6. Hopeful for d/c 1/28 AM  COVID-19 Labs  Recent Labs    11/20/20 2200 11/20/20 2323 11/21/20 0431 11/22/20 0417  DDIMER 1.62*  --  1.44* 1.53*  FERRITIN  --  208  --   --   LDH  --  228*  --    --   CRP  --  5.8* 7.1* 5.3*    Lab Results  Component Value Date   SARSCOV2NAA POSITIVE (A) 11/20/2020   2. Hx MAI 1. S/p treatment with ID, Dr. [name] 2. Consider discussion with ID if considering actemra/baricitinib  3. HTN - 1. Cont home metoprolol  DVT prophylaxis: lovenox Code Status: full  Family Communication: none at bedside - granddaughter 1/27 Disposition:   Status is: Inpatient  Remains inpatient appropriate because:Inpatient level of care appropriate due to severity of illness   Dispo: The patient is from: Home              Anticipated d/c is to: Home              Anticipated d/c date is: > 3 days              Patient currently is not medically stable to d/c.   Difficult to place patient No       Consultants:   none  Procedures:  none  Antimicrobials:  Anti-infectives (From admission, onward)   Start     Dose/Rate Route Frequency Ordered Stop   11/22/20 1000  remdesivir 100 mg in sodium chloride 0.9 % 100 mL IVPB        100 mg 200 mL/hr over 30 Minutes Intravenous Daily 11/20/20 2320 11/26/20 0959   11/21/20 0000  remdesivir  100 mg in sodium chloride 0.9 % 100 mL IVPB        100 mg 200 mL/hr over 30 Minutes Intravenous Every 30 min 11/20/20 2320 11/21/20 0140         Subjective: Feels well, would be eager to d/c tomorrow fi possible Interpreter used  Objective: Vitals:   11/21/20 2127 11/22/20 0449 11/22/20 1329 11/22/20 1645  BP: (!) 164/75 (!) 142/74 139/85   Pulse: (!) 57 (!) 55 69   Resp: 20 (!) 21 (!) 24   Temp: (!) 97.5 F (36.4 C) 98 F (36.7 C) 98.2 F (36.8 C)   TempSrc: Oral Oral Oral   SpO2: 91% 91% 94% 90%  Weight:  82.9 kg    Height:        Intake/Output Summary (Last 24 hours) at 11/22/2020 1831 Last data filed at 11/22/2020 1826 Gross per 24 hour  Intake 850.31 ml  Output -  Net 850.31 ml   Filed Weights   11/20/20 2131 11/22/20 0449  Weight: 83.8 kg 82.9 kg    Examination:  General: No acute  distress.  Appears younger than stated age, pleasant 87 yo F. Cardiovascular: Heart sounds show a regular rate, and rhythm. Lungs: Clear to auscultation bilaterally Abdomen: Soft, nontender, nondistended  Neurological: Alert and oriented 3. Moves all extremities 4Cranial nerves II through XII grossly intact. Skin: Warm and dry. No rashes or lesions. Extremities: No clubbing or cyanosis. No edema.    Data Reviewed: I have personally reviewed following labs and imaging studies  CBC: Recent Labs  Lab 11/20/20 2200 11/21/20 0431 11/22/20 0417  WBC 4.8 3.7* 4.9  NEUTROABS 3.1 2.7 3.4  HGB 14.7 14.9 15.7*  HCT 44.9 46.8* 49.0*  MCV 86.5 88.5 88.0  PLT 141* 151 937    Basic Metabolic Panel: Recent Labs  Lab 11/20/20 2200 11/21/20 0431 11/22/20 0417  NA 133* 136 135  K 3.9 4.0 4.0  CL 100 102 103  CO2 23 24 20*  GLUCOSE 135* 173* 172*  BUN 16 17 23   CREATININE 0.96 0.87 0.95  CALCIUM 8.1* 8.1* 8.3*  MG  --   --  2.3  PHOS  --   --  2.9    GFR: Estimated Creatinine Clearance: 42.7 mL/min (by C-G formula based on SCr of 0.95 mg/dL).  Liver Function Tests: Recent Labs  Lab 11/20/20 2200 11/21/20 0431 11/22/20 0417  AST 30 25 28   ALT 18 18 21   ALKPHOS 67 67 65  BILITOT 0.2* 0.3 0.4  PROT 7.2 7.1 7.2  ALBUMIN 3.6 3.6 3.6    CBG: No results for input(s): GLUCAP in the last 168 hours.   Recent Results (from the past 240 hour(s))  SARS Coronavirus 2 by RT PCR (hospital order, performed in [facility] hospital lab) Nasopharyngeal Nasopharyngeal Swab     Status: Abnormal   Collection Time: 11/20/20 10:00 PM   Specimen: Nasopharyngeal Swab  Result Value Ref Range Status   SARS Coronavirus 2 POSITIVE (A) NEGATIVE Final    Comment: RESULT CALLED TO, READ BACK BY AND VERIFIED WITH: KELLIE NEAL RN ON 11/20/20 AT 2305 [facility] (NOTE) SARS-CoV-2 target nucleic acids are DETECTED  SARS-CoV-2 RNA is generally detectable in upper respiratory specimens  during the acute  phase of infection.  Positive results are indicative  of the presence of the identified virus, but do not rule out bacterial infection or co-infection with other pathogens not detected by the test.  Clinical correlation with patient history and  other diagnostic  information is necessary to determine patient infection status.  The expected result is negative.  Fact Sheet for Patients:   StrictlyIdeas.no   Fact Sheet for Healthcare Providers:   BankingDealers.co.za    This test is not yet approved or cleared by the [place] FDA and  has been authorized for detection and/or diagnosis of SARS-CoV-2 by FDA under an Emergency Use Authorization (EUA).  This EUA will remain in effect (meaning this  test can be used) for the duration of  the COVID-19 declaration under Section 564(b)(1) of the Act, 21 U.S.C. section 360-bbb-3(b)(1), unless the authorization is terminated or revoked sooner.  Performed at [facility], [address] [place], [place] [address]   Blood Culture (routine x 2)     Status: None (Preliminary result)   Collection Time: 11/20/20 11:00 PM   Specimen: BLOOD  Result Value Ref Range Status   Specimen Description   Final    BLOOD LEFT ANTECUBITAL Performed at [facility], [address] [place], [place] [address]    Special Requests   Final    BOTTLES DRAWN AEROBIC AND ANAEROBIC Blood Culture adequate volume Performed at [facility] [address], [place], [place] [address]    Culture   Final    NO GROWTH 1 DAY Performed at [facility], [place] [address] [place], [place] [address]    Report Status PENDING  Incomplete  Blood Culture (routine x 2)     Status: None (Preliminary result)   Collection Time: 11/20/20 11:40 PM   Specimen: BLOOD RIGHT HAND  Result Value Ref Range Status   Specimen Description BLOOD RIGHT HAND  Final   Special Requests   Final    BOTTLES DRAWN  AEROBIC AND ANAEROBIC Blood Culture adequate volume   Culture   Final    NO GROWTH 1 DAY Performed at [facility], [place] [address] [place], [place] [address]    Report Status PENDING  Incomplete         Radiology Studies: CT Angio Chest PE W and/or Wo Contrast  Result Date: 11/20/2020 CLINICAL DATA:  Elevated D-dimer, COVID exposure, fatigue EXAM: CT ANGIOGRAPHY CHEST WITH CONTRAST TECHNIQUE: Multidetector CT imaging of the chest was performed using the standard protocol during bolus administration of intravenous contrast. Multiplanar CT image reconstructions and MIPs were obtained to evaluate the vascular anatomy. CONTRAST:  175mL OMNIPAQUE IOHEXOL 350 MG/ML SOLN COMPARISON:  04/27/2020 FINDINGS: Cardiovascular: There is adequate opacification of the pulmonary arterial tree. No intraluminal filling defect identified to suggest acute pulmonary embolism. The central pulmonary arteries are of normal caliber. Mild coronary artery calcification. Global cardiac size within normal limits. No pericardial effusion. Minimal atherosclerotic calcification within the thoracic aorta. No aortic aneurysm. Mediastinum/Nodes: 17 mm cyst within the left thyroid gland. Shotty mediastinal and left hilar adenopathy may be reactive in nature. No frankly pathologic thoracic adenopathy. The esophagus is unremarkable. Small hiatal hernia present. Lungs/Pleura: Mild biapical paraseptal emphysema again noted. Areas of tumefactive scarring within the lung apices bilaterally are unchanged. There is diffuse, peripheral ground-glass pulmonary infiltrate now present, most in keeping with atypical infection or inflammation and compatible with the given history of acute COVID pneumonia. No pneumothorax or pleural effusion. The central airways are widely patent. Upper Abdomen: Left hepatic lobe cyst is partially visualized. Atheromatous plaque is seen within the thoracoabdominal aorta at the diaphragmatic hiatus. No acute  abnormality identified. Musculoskeletal: No lytic or blastic bone lesions are  seen. Review of the MIP images confirms the above findings. IMPRESSION: No pulmonary embolism. Interval development of extensive bilateral ground-glass pulmonary infiltrates, most in keeping with atypical infection or inflammation and compatible with the given history of COVID-19 pneumonia. Extensive parenchymal involvement. Mild emphysema. Stable changes of a tumefactive scarring at the lung apices bilaterally. Aortic Atherosclerosis (ICD10-I70.0). Electronically Signed   By: Fidela Salisbury MD   On: 11/20/2020 23:39   DG Chest Portable 1 View  Result Date: 11/20/2020 CLINICAL DATA:  Hypoxia. EXAM: PORTABLE CHEST 1 VIEW COMPARISON:  Chest radiograph dated 12/07/2017 and CT dated 04/27/2020. FINDINGS: Chronic bilateral upper lobe densities as seen on the prior radiograph and CT. No new consolidation. There is no pleural effusion pneumothorax. The cardiac silhouette is within limits. Atherosclerotic calcification of the aorta. Degenerative changes of the shoulders. No acute osseous pathology. IMPRESSION: No acute cardiopulmonary process. Electronically Signed   By: Anner Crete M.D.   On: 11/20/2020 22:01        Scheduled Meds: . enoxaparin (LOVENOX) injection  40 mg Subcutaneous Q24H  . methylPREDNISolone (SOLU-MEDROL) injection  40 mg Intravenous Q12H  . metoprolol succinate  50 mg Oral Daily   Continuous Infusions: . sodium chloride Stopped (11/22/20 1309)  . remdesivir 100 mg in NS 100 mL 100 mg (11/22/20 1026)     LOS: 1 day    Time spent: over 74 min    Fayrene Helper, MD [facility]   To contact the attending provider between 7A-7P or the covering provider during after hours 7P-7A, please log into the web site [email or web address] and access using universal [place] password for that web site. If you do not have the password, please call the hospital operator.  11/22/2020, 6:31 PM

## 2020-11-22 NOTE — Plan of Care (Signed)
  Problem: Education: Goal: Knowledge of risk factors and measures for prevention of condition will improve Outcome: Progressing   Problem: Respiratory: Goal: Complications related to the disease process, condition or treatment will be avoided or minimized Outcome: Progressing   Problem: Education: Goal: Knowledge of General Education information will improve Description: Including pain rating scale, medication(s)/side effects and non-pharmacologic comfort measures Outcome: Progressing   Problem: Health Behavior/Discharge Planning: Goal: Ability to manage health-related needs will improve Outcome: Progressing   Problem: Clinical Measurements: Goal: Ability to maintain clinical measurements within normal limits will improve Outcome: Progressing Goal: Will remain free from infection Outcome: Progressing Goal: Diagnostic test results will improve Outcome: Progressing   Problem: Activity: Goal: Risk for activity intolerance will decrease Outcome: Progressing   Problem: Nutrition: Goal: Adequate nutrition will be maintained Outcome: Progressing   Problem: Elimination: Goal: Will not experience complications related to bowel motility Outcome: Progressing   Problem: Pain Managment: Goal: General experience of comfort will improve Outcome: Progressing   Problem: Safety: Goal: Ability to remain free from injury will improve Outcome: Progressing   Problem: Skin Integrity: Goal: Risk for impaired skin integrity will decrease Outcome: Progressing

## 2020-11-22 NOTE — Evaluation (Signed)
Physical Therapy Evaluation Patient Details Name: Brooke Mora MRN: [id number] DOB: 09-01-1929 Today's Date: 11/22/2020   History of Present Illness  Pt is a 87 y.o. 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  She has been admitted with COVID 19 with new O2 needs,  Clinical Impression  Pt admitted with above diagnosis. Pt seen for PT today and overall did well.  She reports normally independent at home and has support from multiple family members if needed at discharge.  She was able to ambulate with min guard but did have 2 LOB with turns. Attempted ambulation on RA with sats down to 88%.  Will benefit from PT to address decreased mobility, cardiopulmonary endurance, balance, and safety. Pt currently with functional limitations due to the deficits listed below (see PT Problem List). Pt will benefit from skilled PT to increase their independence and safety with mobility to allow discharge to the venue listed below.       Follow Up Recommendations No PT follow up    Equipment Recommendations  Rolling walker with 5" wheels    Recommendations for Other Services       Precautions / Restrictions Precautions Precautions: Other (comment) Precaution Comments: monitor O2      Mobility  Bed Mobility               General bed mobility comments: in chair and returned to chair    Transfers Overall transfer level: Needs assistance Equipment used: None Transfers: Sit to/from Stand Sit to Stand: Supervision         General transfer comment: Performed x 3 safely with supervision  Ambulation/Gait Ambulation/Gait assistance: Min guard Gait Distance (Feet): 60 Feet (60'x2) Assistive device: None Gait Pattern/deviations: Step-through pattern;Decreased stride length Gait velocity: decreased   General Gait Details: Pt had 2 LOB with turning and recovered by grabbing wall or foot of bed.  Reports felt like because she has not walked much in a  few days.  Advised having supervision with ambulation at home.  See below for VS  Stairs            Wheelchair Mobility    Modified Rankin (Stroke Patients Only)       Balance Overall balance assessment: Needs assistance Sitting-balance support: No upper extremity supported Sitting balance-Leahy Scale: Normal     Standing balance support: No upper extremity supported Standing balance-Leahy Scale: Fair Standing balance comment: Steady with gait and static; did have LOB with turns                             Pertinent Vitals/Pain Pain Assessment: No/denies pain    Home Living Family/patient expects to be discharged to:: Private residence Living Arrangements: Other (Comment) (granddaughter and her family) Available Help at Discharge: Family;Available 24 hours/day Type of Home: House Home Access: Stairs to enter   [employer] of Steps: 2 Home Layout: One level Home Equipment: None      Prior Function Level of Independence: Independent         Comments: Reports independent with ambulation, ADLs, and IADLs. Stated "I do well for almost 90, even my makeup and nails"; denies falls     Hand Dominance        Extremity/Trunk Assessment   Upper Extremity Assessment Upper Extremity Assessment: Overall WFL for tasks assessed    Lower Extremity Assessment Lower Extremity Assessment: Overall WFL for tasks assessed  Communication   Communication: HOH;Prefers language other than English (Utilized Video Intrepretor: Marycarmen ID number [phone])  Cognition Arousal/Alertness: Awake/alert Behavior During Therapy: WFL for tasks assessed/performed Overall Cognitive Status: Within Functional Limits for tasks assessed                                        General Comments General comments (skin integrity, edema, etc.): Pt on RA at arrival.  Reports she had just removed [place] that was set at 2 L.  On RA sats were 90% rest and  88% with ambulation.  Sats were 92% rest on 2 L.  Educated to continue wearing O2 at this time    Exercises     Assessment/Plan    PT Assessment Patient needs continued PT services  PT Problem List Decreased mobility;Decreased safety awareness;Decreased activity tolerance;Decreased balance;Decreased knowledge of use of DME;Cardiopulmonary status limiting activity       PT Treatment Interventions DME instruction;Therapeutic activities;Gait training;Therapeutic exercise;Patient/family education;Balance training;Functional mobility training    PT Goals (Current goals can be found in the Care Plan section)  Acute Rehab PT Goals Patient Stated Goal: return home PT Goal Formulation: With patient Time For Goal Achievement: 12/06/20 Potential to Achieve Goals: Good    Frequency Min 3X/week   Barriers to discharge        Co-evaluation               AM-PAC PT "6 Clicks" Mobility  Outcome Measure Help needed turning from your back to your side while in a flat bed without using bedrails?: A Little Help needed moving from lying on your back to sitting on the side of a flat bed without using bedrails?: A Little Help needed moving to and from a bed to a chair (including a wheelchair)?: A Little Help needed standing up from a chair using your arms (e.g., wheelchair or bedside chair)?: A Little Help needed to walk in hospital room?: A Little Help needed climbing 3-5 steps with a railing? : A Little 6 Click Score: 18    End of Session Equipment Utilized During Treatment: Oxygen Activity Tolerance: Patient tolerated treatment well Patient left: in chair;with call bell/phone within reach Nurse Communication: Mobility status (O2 sats only down to 88% on RA with activity) PT Visit Diagnosis: Difficulty in walking, not elsewhere classified (R26.2);Unsteadiness on feet (R26.81)    Time: 6789-3810 PT Time Calculation (min) (ACUTE ONLY): 27 min   Charges:   PT Evaluation $PT Eval  Moderate Complexity: 1 Mod PT Treatments $Gait Training: 8-22 mins        Abran Richard, PT Acute Rehab Services Pager [phone] [facility] 11/22/2020, 3:50 PM

## 2020-11-23 ENCOUNTER — Inpatient Hospital Stay (HOSPITAL_COMMUNITY): Payer: Medicaid Other

## 2020-11-23 DIAGNOSIS — J9601 Acute respiratory failure with hypoxia: Secondary | ICD-10-CM | POA: Diagnosis not present

## 2020-11-23 DIAGNOSIS — U071 COVID-19: Secondary | ICD-10-CM | POA: Diagnosis not present

## 2020-11-23 LAB — CBC WITH DIFFERENTIAL/PLATELET
Abs Immature Granulocytes: 0.04 10*3/uL (ref 0.00–0.07)
Basophils Absolute: 0 10*3/uL (ref 0.0–0.1)
Basophils Relative: 1 %
Eosinophils Absolute: 0 10*3/uL (ref 0.0–0.5)
Eosinophils Relative: 0 %
HCT: 49.1 % — ABNORMAL HIGH (ref 36.0–46.0)
Hemoglobin: 16.2 g/dL — ABNORMAL HIGH (ref 12.0–15.0)
Immature Granulocytes: 1 %
Lymphocytes Relative: 16 %
Lymphs Abs: 1 10*3/uL (ref 0.7–4.0)
MCH: 28.1 pg (ref 26.0–34.0)
MCHC: 33 g/dL (ref 30.0–36.0)
MCV: 85.1 fL (ref 80.0–100.0)
Monocytes Absolute: 0.3 10*3/uL (ref 0.1–1.0)
Monocytes Relative: 6 %
Neutro Abs: 4.5 10*3/uL (ref 1.7–7.7)
Neutrophils Relative %: 76 %
Platelets: 203 10*3/uL (ref 150–400)
RBC: 5.77 MIL/uL — ABNORMAL HIGH (ref 3.87–5.11)
RDW: 12.8 % (ref 11.5–15.5)
WBC: 5.9 10*3/uL (ref 4.0–10.5)
nRBC: 0 % (ref 0.0–0.2)

## 2020-11-23 LAB — COMPREHENSIVE METABOLIC PANEL
ALT: 21 U/L (ref 0–44)
AST: 28 U/L (ref 15–41)
Albumin: 3.5 g/dL (ref 3.5–5.0)
Alkaline Phosphatase: 58 U/L (ref 38–126)
Anion gap: 11 (ref 5–15)
BUN: 21 mg/dL (ref 8–23)
CO2: 22 mmol/L (ref 22–32)
Calcium: 8.8 mg/dL — ABNORMAL LOW (ref 8.9–10.3)
Chloride: 107 mmol/L (ref 98–111)
Creatinine, Ser: 0.89 mg/dL (ref 0.44–1.00)
GFR, Estimated: >60 mL/min (ref >60)
Glucose, Bld: 148 mg/dL — ABNORMAL HIGH (ref 70–99)
Potassium: 4.3 mmol/L (ref 3.5–5.1)
Sodium: 140 mmol/L (ref 135–145)
Total Bilirubin: 0.4 mg/dL (ref 0.3–1.2)
Total Protein: 7.1 g/dL (ref 6.5–8.1)

## 2020-11-23 LAB — C-REACTIVE PROTEIN: CRP: 1.9 mg/dL — ABNORMAL HIGH (ref <1.0)

## 2020-11-23 LAB — D-DIMER, QUANTITATIVE: D-Dimer, Quant: 1.12 ug/mL-FEU — ABNORMAL HIGH (ref 0.00–0.50)

## 2020-11-23 NOTE — Progress Notes (Signed)
PROGRESS NOTE    Brooke Mora  JXB:[id number] DOB: Mora DOA: 11/20/2020 PCP: Benito Mccreedy, MD   Chief Complaint  Patient presents with  . Hypoxic   Brief Narrative:  Brooke Mora is Brooke Mora 87 y.o. female with medical history significant of COPD/emphysema, HTN, prior MAI off of treatment since early 2018 (saw [name]).  Pt presents to the ED at [facility] with c/o SOB, cough.  Pt with COVID exposure 2 weeks ago.  Fatigue and cough for 1 week.  Multiple other family members positive for COVID.  Pt pulse ox at home today in the 80s prompting family to send her to ED for hypoxia.  Pt not normally on O2 at home.  Pt COVID vaccinated.  Pt is actually rather asymptomatic, and despite having O2 sats down into the 80s, says she is breathing as she always does without c/o SOB.  Denies CP, abd pain, fever (subjective), leg pain, leg swelling.  On azithromycin for past few days but no improvement in terms of fatigue and poor PO intake.  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  Assessment & Plan:   Principal Problem:   Acute hypoxemic respiratory failure due to COVID-19 [facility]) Active Problems:   Cavitary lesion of lung   HTN (hypertension)  1. COVID-19 with new O2 requirement - 1. Currently requiring 4 L O2, wean as tolerated 2. CT chest with extensive bilateral ground glass pulm infiltrates, mild emphysema, tumefactive scarring at lung apices bilaterally 3. Steroids, remdesivir.  Consider actemra/baricitinib (given hx of prior MAI, discuss with ID if felt indicated), though with modest O2 requirement, will see how she does with steroids. 4. Strict I/O, daily weights 5. Prone as able, OOB, IS, flutter, therapy 6. Continues to require O2 with activity, will continue to monitor on steroids  COVID-19 Labs  Recent Labs    11/20/20 2323 11/21/20 0431 11/22/20 0417 11/23/20 0422 11/23/20 0538  DDIMER  --   1.44* 1.53* 1.12*  --   FERRITIN 208  --   --   --   --   LDH 228*  --   --   --   --   CRP 5.8* 7.1* 5.3*  --  1.9*    Lab Results  Component Value Date   SARSCOV2NAA POSITIVE (Brooke Mora) 11/20/2020   2. Hx MAI 1. S/p treatment with ID, Dr. [name] 2. Consider discussion with ID if considering actemra/baricitinib  3. HTN - 1. Cont home metoprolol  DVT prophylaxis: lovenox Code Status: full  Family Communication: none at bedside - granddaughter 1/27 Disposition:   Status is: Inpatient  Remains inpatient appropriate because:Inpatient level of care appropriate due to severity of illness   Dispo: The patient is from: Home              Anticipated d/c is to: Home              Anticipated d/c date is: > 3 days              Patient currently is not medically stable to d/c.   Difficult to place patient No       Consultants:   none  Procedures:  none  Antimicrobials:  Anti-infectives (From admission, onward)   Start     Dose/Rate Route Frequency Ordered Stop   11/22/20 1000  remdesivir 100 mg in sodium chloride 0.9 % 100 mL IVPB  100 mg 200 mL/hr over 30 Minutes Intravenous Daily 11/20/20 2320 11/26/20 0959   11/21/20 0000  remdesivir 100 mg in sodium chloride 0.9 % 100 mL IVPB        100 mg 200 mL/hr over 30 Minutes Intravenous Every 30 min 11/20/20 2320 11/21/20 0140         Subjective: Feels well, no new complaints Interpreter used  Objective: Vitals:   11/22/20 1848 11/22/20 1958 11/23/20 0529 11/23/20 0601  BP:  (!) 145/66 (!) 178/77 (!) 166/74  Pulse:  (!) 58 (!) 54   Resp:  18 20   Temp:  97.9 F (36.6 C) 98.2 F (36.8 C)   TempSrc:  Oral Oral   SpO2: 92% 91% (!) 85% 90%  Weight:   85.2 kg   Height:        Intake/Output Summary (Last 24 hours) at 11/23/2020 1334 Last data filed at 11/22/2020 2100 Gross per 24 hour  Intake 950 ml  Output --  Net 950 ml   Filed Weights   11/20/20 2131 11/22/20 0449 11/23/20 0529  Weight: 83.8 kg 82.9 kg  85.2 kg    Examination:  General: No acute distress. Pleasant, looks more SOB than yesterday. Cardiovascular: Heart sounds show Sherrin Stahle regular rate, and rhythm Lungs: distant breathsounds, no adventitious lung sounds appreciated - appears more SOB today, mildly tachypneic after sitting down from bathroom Abdomen: Soft, nontender, nondistended Neurological: Alert and oriented 3. Moves all extremities 4. Cranial nerves II through XII grossly intact. Skin: Warm and dry. No rashes or lesions. Extremities: No clubbing or cyanosis. No edema.     Data Reviewed: I have personally reviewed following labs and imaging studies  CBC: Recent Labs  Lab 11/20/20 2200 11/21/20 0431 11/22/20 0417 11/23/20 0422  WBC 4.8 3.7* 4.9 5.9  NEUTROABS 3.1 2.7 3.4 4.5  HGB 14.7 14.9 15.7* 16.2*  HCT 44.9 46.8* 49.0* 49.1*  MCV 86.5 88.5 88.0 85.1  PLT 141* 151 213 032    Basic Metabolic Panel: Recent Labs  Lab 11/20/20 2200 11/21/20 0431 11/22/20 0417 11/23/20 0538  NA 133* 136 135 140  K 3.9 4.0 4.0 4.3  CL 100 102 103 107  CO2 23 24 20* 22  GLUCOSE 135* 173* 172* 148*  BUN 16 17 23 21   CREATININE 0.96 0.87 0.95 0.89  CALCIUM 8.1* 8.1* 8.3* 8.8*  MG  --   --  2.3  --   PHOS  --   --  2.9  --     GFR: Estimated Creatinine Clearance: 46.2 mL/min (by C-G formula based on SCr of 0.89 mg/dL).  Liver Function Tests: Recent Labs  Lab 11/20/20 2200 11/21/20 0431 11/22/20 0417 11/23/20 0538  AST 30 25 28 28   ALT 18 18 21 21   ALKPHOS 67 67 65 58  BILITOT 0.2* 0.3 0.4 0.4  PROT 7.2 7.1 7.2 7.1  ALBUMIN 3.6 3.6 3.6 3.5    CBG: No results for input(s): GLUCAP in the last 168 hours.   Recent Results (from the past 240 hour(s))  SARS Coronavirus 2 by RT PCR (hospital order, performed in [facility] hospital lab) Nasopharyngeal Nasopharyngeal Swab     Status: Abnormal   Collection Time: 11/20/20 10:00 PM   Specimen: Nasopharyngeal Swab  Result Value Ref Range Status   SARS Coronavirus  2 POSITIVE (Brooke Mora) NEGATIVE Final    Comment: RESULT CALLED TO, READ BACK BY AND VERIFIED WITH: KELLIE NEAL RN ON 11/20/20 AT 2305 [facility] (NOTE) SARS-CoV-2 target nucleic acids are  DETECTED  SARS-CoV-2 RNA is generally detectable in upper respiratory specimens  during the acute phase of infection.  Positive results are indicative  of the presence of the identified virus, but do not rule out bacterial infection or co-infection with other pathogens not detected by the test.  Clinical correlation with patient history and  other diagnostic information is necessary to determine patient infection status.  The expected result is negative.  Fact Sheet for Patients:   StrictlyIdeas.no   Fact Sheet for Healthcare Providers:   BankingDealers.co.za    This test is not yet approved or cleared by the [place] FDA and  has been authorized for detection and/or diagnosis of SARS-CoV-2 by FDA under an Emergency Use Authorization (EUA).  This EUA will remain in effect (meaning this  test can be used) for the duration of  the COVID-19 declaration under Section 564(b)(1) of the Act, 21 U.S.C. section 360-bbb-3(b)(1), unless the authorization is terminated or revoked sooner.  Performed at [facility], [address] [place], [place] [address]   Blood Culture (routine x 2)     Status: None (Preliminary result)   Collection Time: 11/20/20 11:00 PM   Specimen: BLOOD  Result Value Ref Range Status   Specimen Description   Final    BLOOD LEFT ANTECUBITAL Performed at [facility], [address] [place], [place] [address]    Special Requests   Final    BOTTLES DRAWN AEROBIC AND ANAEROBIC Blood Culture adequate volume Performed at [facility], [address] [place], [place] [address]    Culture   Final    NO GROWTH 2 DAYS Performed at [facility], [place] [address], [place], [place] [address]    Report Status  PENDING  Incomplete  Blood Culture (routine x 2)     Status: None (Preliminary result)   Collection Time: 11/20/20 11:40 PM   Specimen: BLOOD RIGHT HAND  Result Value Ref Range Status   Specimen Description BLOOD RIGHT HAND  Final   Special Requests   Final    BOTTLES DRAWN AEROBIC AND ANAEROBIC Blood Culture adequate volume   Culture   Final    NO GROWTH 2 DAYS Performed at [facility], [place] [address] [place], [place] [address]    Report Status PENDING  Incomplete         Radiology Studies: No results found.      Scheduled Meds: . dexamethasone  6 mg Oral Daily  . enoxaparin (LOVENOX) injection  40 mg Subcutaneous Q24H  . metoprolol succinate  50 mg Oral Daily   Continuous Infusions: . sodium chloride Stopped (11/22/20 1309)  . remdesivir 100 mg in NS 100 mL 100 mg (11/23/20 1014)     LOS: 2 days    Time spent: over 65 min    Brooke Helper, MD [facility]   To contact the attending provider between 7A-7P or the covering provider during after hours 7P-7A, please log into the web site [email or web address] and access using universal [place] password for that web site. If you do not have the password, please call the hospital operator.  11/23/2020, 1:34 PM

## 2020-11-24 DIAGNOSIS — J9601 Acute respiratory failure with hypoxia: Secondary | ICD-10-CM | POA: Diagnosis not present

## 2020-11-24 DIAGNOSIS — U071 COVID-19: Secondary | ICD-10-CM | POA: Diagnosis not present

## 2020-11-24 LAB — COMPREHENSIVE METABOLIC PANEL
ALT: 23 U/L (ref 0–44)
AST: 28 U/L (ref 15–41)
Albumin: 3.2 g/dL — ABNORMAL LOW (ref 3.5–5.0)
Alkaline Phosphatase: 55 U/L (ref 38–126)
Anion gap: 10 (ref 5–15)
BUN: 26 mg/dL — ABNORMAL HIGH (ref 8–23)
CO2: 25 mmol/L (ref 22–32)
Calcium: 8.2 mg/dL — ABNORMAL LOW (ref 8.9–10.3)
Chloride: 105 mmol/L (ref 98–111)
Creatinine, Ser: 0.77 mg/dL (ref 0.44–1.00)
GFR, Estimated: >60 mL/min (ref >60)
Glucose, Bld: 120 mg/dL — ABNORMAL HIGH (ref 70–99)
Potassium: 3.8 mmol/L (ref 3.5–5.1)
Sodium: 140 mmol/L (ref 135–145)
Total Bilirubin: 0.6 mg/dL (ref 0.3–1.2)
Total Protein: 6.4 g/dL — ABNORMAL LOW (ref 6.5–8.1)

## 2020-11-24 LAB — PHOSPHORUS: Phosphorus: 2.6 mg/dL (ref 2.5–4.6)

## 2020-11-24 LAB — CBC WITH DIFFERENTIAL/PLATELET
Abs Immature Granulocytes: 0.06 10*3/uL (ref 0.00–0.07)
Basophils Absolute: 0 10*3/uL (ref 0.0–0.1)
Basophils Relative: 0 %
Eosinophils Absolute: 0 10*3/uL (ref 0.0–0.5)
Eosinophils Relative: 0 %
HCT: 45.5 % (ref 36.0–46.0)
Hemoglobin: 14.8 g/dL (ref 12.0–15.0)
Immature Granulocytes: 1 %
Lymphocytes Relative: 18 %
Lymphs Abs: 1.5 10*3/uL (ref 0.7–4.0)
MCH: 28 pg (ref 26.0–34.0)
MCHC: 32.5 g/dL (ref 30.0–36.0)
MCV: 86.2 fL (ref 80.0–100.0)
Monocytes Absolute: 0.7 10*3/uL (ref 0.1–1.0)
Monocytes Relative: 8 %
Neutro Abs: 5.9 10*3/uL (ref 1.7–7.7)
Neutrophils Relative %: 73 %
Platelets: 247 10*3/uL (ref 150–400)
RBC: 5.28 MIL/uL — ABNORMAL HIGH (ref 3.87–5.11)
RDW: 12.8 % (ref 11.5–15.5)
WBC: 8.1 10*3/uL (ref 4.0–10.5)
nRBC: 0 % (ref 0.0–0.2)

## 2020-11-24 LAB — MAGNESIUM: Magnesium: 2.3 mg/dL (ref 1.7–2.4)

## 2020-11-24 LAB — C-REACTIVE PROTEIN: CRP: 0.9 mg/dL (ref <1.0)

## 2020-11-24 LAB — D-DIMER, QUANTITATIVE: D-Dimer, Quant: 0.94 ug/mL-FEU — ABNORMAL HIGH (ref 0.00–0.50)

## 2020-11-24 MED ORDER — METHYLPREDNISOLONE SODIUM SUCC 40 MG IJ SOLR
40.0000 mg | Freq: Two times a day (BID) | INTRAMUSCULAR | Status: DC
  Administered 2020-11-24 – 2020-11-28 (×9): 40 mg via INTRAVENOUS
  Filled 2020-11-24 (×9): qty 1

## 2020-11-24 NOTE — Plan of Care (Signed)
  Problem: Education: Goal: Knowledge of risk factors and measures for prevention of condition will improve Outcome: Progressing   Problem: Respiratory: Goal: Complications related to the disease process, condition or treatment will be avoided or minimized Outcome: Progressing   Problem: Education: Goal: Knowledge of General Education information will improve Description: Including pain rating scale, medication(s)/side effects and non-pharmacologic comfort measures Outcome: Progressing   Problem: Health Behavior/Discharge Planning: Goal: Ability to manage health-related needs will improve Outcome: Progressing   Problem: Clinical Measurements: Goal: Ability to maintain clinical measurements within normal limits will improve Outcome: Progressing Goal: Will remain free from infection Outcome: Progressing Goal: Diagnostic test results will improve Outcome: Progressing   Problem: Activity: Goal: Risk for activity intolerance will decrease Outcome: Progressing

## 2020-11-24 NOTE — Progress Notes (Signed)
PROGRESS NOTE    Brooke Mora  BVQ:[id number] DOB: May 18, 1929 DOA: 11/20/2020 PCP: Benito Mccreedy, MD   Chief Complaint  Patient presents with  . Hypoxic   Brief Narrative:  Brooke Mora is Brooke Mora 87 y.o. female with medical history significant of COPD/emphysema, HTN, prior MAI off of treatment since early 2018 (saw [name]).  Pt presents to the ED at [facility] with c/o SOB, cough.  Pt with COVID exposure 2 weeks ago.  Fatigue and cough for 1 week.  Multiple other family members positive for COVID.  Pt pulse ox at home today in the 80s prompting family to send her to ED for hypoxia.  Pt not normally on O2 at home.  Pt COVID vaccinated.  Pt is actually rather asymptomatic, and despite having O2 sats down into the 80s, says she is breathing as she always does without c/o SOB.  Denies CP, abd pain, fever (subjective), leg pain, leg swelling.  On azithromycin for past few days but no improvement in terms of fatigue and poor PO intake.  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  Assessment & Plan:   Principal Problem:   Acute hypoxemic respiratory failure due to COVID-19 [facility]) Active Problems:   Cavitary lesion of lung   HTN (hypertension)  1. COVID-19 with new O2 requirement - 1. Currently requiring 4 L O2, wean as tolerated (pulse ox inaccurate in room, appreciate RN assistance) 2. CT chest with extensive bilateral ground glass pulm infiltrates, mild emphysema, tumefactive scarring at lung apices bilaterally 3. Steroids (1/25-present), remdesivir (1/25-present).  Consider actemra/baricitinib (given hx of prior MAI, discuss with ID if felt indicated). 4. Strict I/O, daily weights 5. Prone as able, OOB, IS, flutter, therapy 6. Continues to require O2 with activity, will continue to monitor on steroids  COVID-19 Labs  Recent Labs    11/22/20 0417 11/23/20 0422 11/23/20 0538 11/24/20 0425  DDIMER 1.53*  1.12*  --  0.94*  CRP 5.3*  --  1.9* 0.9    Lab Results  Component Value Date   SARSCOV2NAA POSITIVE (Rob Mciver) 11/20/2020   2. Hx MAI 1. S/p treatment with ID, Dr. [name] 2. Consider discussion with ID if considering actemra/baricitinib  3. OSA 1. CPAP nightly  4. HTN - 1. Cont home metoprolol  DVT prophylaxis: lovenox Code Status: full  Family Communication: none at bedside - granddaughter 1/28 Disposition:   Status is: Inpatient  Remains inpatient appropriate because:Inpatient level of care appropriate due to severity of illness   Dispo: The patient is from: Home              Anticipated d/c is to: Home              Anticipated d/c date is: > 3 days              Patient currently is not medically stable to d/c.   Difficult to place patient No       Consultants:   none  Procedures:  none  Antimicrobials:  Anti-infectives (From admission, onward)   Start     Dose/Rate Route Frequency Ordered Stop   11/22/20 1000  remdesivir 100 mg in sodium chloride 0.9 % 100 mL IVPB        100 mg 200 mL/hr over 30 Minutes Intravenous Daily 11/20/20 2320 11/26/20 0959   11/21/20 0000  remdesivir 100 mg in sodium chloride 0.9 % 100 mL IVPB  100 mg 200 mL/hr over 30 Minutes Intravenous Every 30 min 11/20/20 2320 11/21/20 0140         Subjective: No new complaints Felt SOB last night, thinks covers were over face Interpreter used  Objective: Vitals:   11/23/20 2001 11/24/20 0402 11/24/20 0500 11/24/20 0942  BP: 139/79 (!) 168/72  133/62  Pulse: 63 (!) 53  66  Resp: 18 16    Temp: 97.9 F (36.6 C) 97.9 F (36.6 C)    TempSrc:      SpO2: (!) 89% (!) 87%    Weight:   89.2 kg   Height:        Intake/Output Summary (Last 24 hours) at 11/24/2020 1440 Last data filed at 11/23/2020 1535 Gross per 24 hour  Intake 472 ml  Output --  Net 472 ml   Filed Weights   11/22/20 0449 11/23/20 0529 11/24/20 0500  Weight: 82.9 kg 85.2 kg 89.2 kg     Examination:  General: No acute distress. Appears more fatigued today. Cardiovascular: Heart sounds show Chauncy Mangiaracina regular rate, and rhythm.  Lungs: Clear to auscultation bilaterally Abdomen: Soft, nontender, nondistended  Neurological: Alert and oriented 3. Moves all extremities 4 . Cranial nerves II through XII grossly intact. Skin: Warm and dry. No rashes or lesions. Extremities: No clubbing or cyanosis. No edema.     Data Reviewed: I have personally reviewed following labs and imaging studies  CBC: Recent Labs  Lab 11/20/20 2200 11/21/20 0431 11/22/20 0417 11/23/20 0422 11/24/20 0425  WBC 4.8 3.7* 4.9 5.9 8.1  NEUTROABS 3.1 2.7 3.4 4.5 5.9  HGB 14.7 14.9 15.7* 16.2* 14.8  HCT 44.9 46.8* 49.0* 49.1* 45.5  MCV 86.5 88.5 88.0 85.1 86.2  PLT 141* 151 213 203 893    Basic Metabolic Panel: Recent Labs  Lab 11/20/20 2200 11/21/20 0431 11/22/20 0417 11/23/20 0538 11/24/20 0425  NA 133* 136 135 140 140  K 3.9 4.0 4.0 4.3 3.8  CL 100 102 103 107 105  CO2 23 24 20* 22 25  GLUCOSE 135* 173* 172* 148* 120*  BUN 16 17 23 21  26*  CREATININE 0.96 0.87 0.95 0.89 0.77  CALCIUM 8.1* 8.1* 8.3* 8.8* 8.2*  MG  --   --  2.3  --  2.3  PHOS  --   --  2.9  --  2.6    GFR: Estimated Creatinine Clearance: 52.6 mL/min (by C-G formula based on SCr of 0.77 mg/dL).  Liver Function Tests: Recent Labs  Lab 11/20/20 2200 11/21/20 0431 11/22/20 0417 11/23/20 0538 11/24/20 0425  AST 30 25 28 28 28   ALT 18 18 21 21 23   ALKPHOS 67 67 65 58 55  BILITOT 0.2* 0.3 0.4 0.4 0.6  PROT 7.2 7.1 7.2 7.1 6.4*  ALBUMIN 3.6 3.6 3.6 3.5 3.2*    CBG: No results for input(s): GLUCAP in the last 168 hours.   Recent Results (from the past 240 hour(s))  SARS Coronavirus 2 by RT PCR (hospital order, performed in [facility] hospital lab) Nasopharyngeal Nasopharyngeal Swab     Status: Abnormal   Collection Time: 11/20/20 10:00 PM   Specimen: Nasopharyngeal Swab  Result Value Ref Range Status    SARS Coronavirus 2 POSITIVE (Nichoals Heyde) NEGATIVE Final    Comment: RESULT CALLED TO, READ BACK BY AND VERIFIED WITH: KELLIE NEAL RN ON 11/20/20 AT 2305 [facility] (NOTE) SARS-CoV-2 target nucleic acids are DETECTED  SARS-CoV-2 RNA is generally detectable in upper respiratory specimens  during the acute phase of  infection.  Positive results are indicative  of the presence of the identified virus, but do not rule out bacterial infection or co-infection with other pathogens not detected by the test.  Clinical correlation with patient history and  other diagnostic information is necessary to determine patient infection status.  The expected result is negative.  Fact Sheet for Patients:   StrictlyIdeas.no   Fact Sheet for Healthcare Providers:   BankingDealers.co.za    This test is not yet approved or cleared by the [place] FDA and  has been authorized for detection and/or diagnosis of SARS-CoV-2 by FDA under an Emergency Use Authorization (EUA).  This EUA will remain in effect (meaning this  test can be used) for the duration of  the COVID-19 declaration under Section 564(b)(1) of the Act, 21 U.S.C. section 360-bbb-3(b)(1), unless the authorization is terminated or revoked sooner.  Performed at [facility], [address] [place], [place] [address]   Blood Culture (routine x 2)     Status: None (Preliminary result)   Collection Time: 11/20/20 11:00 PM   Specimen: BLOOD  Result Value Ref Range Status   Specimen Description   Final    BLOOD LEFT ANTECUBITAL Performed at [facility], [address] [place], [place] [address]    Special Requests   Final    BOTTLES DRAWN AEROBIC AND ANAEROBIC Blood Culture adequate volume Performed at [facility], [address] [place], [place] [address]    Culture   Final    NO GROWTH 2 DAYS Performed at [facility], [place] [address], [place], [place] [address]     Report Status PENDING  Incomplete  Blood Culture (routine x 2)     Status: None (Preliminary result)   Collection Time: 11/20/20 11:40 PM   Specimen: BLOOD RIGHT HAND  Result Value Ref Range Status   Specimen Description BLOOD RIGHT HAND  Final   Special Requests   Final    BOTTLES DRAWN AEROBIC AND ANAEROBIC Blood Culture adequate volume   Culture   Final    NO GROWTH 2 DAYS Performed at [facility], [place] [address], [place], [place] [address]    Report Status PENDING  Incomplete         Radiology Studies: DG CHEST PORT 1 VIEW  Result Date: 11/23/2020 CLINICAL DATA:  COPD, shortness of breath, cough EXAM: PORTABLE CHEST 1 VIEW COMPARISON:  11/20/2020 FINDINGS: Heart is normal size. Interstitial prominence and patchy opacities throughout the lungs, left greater than right. Findings slightly worsened in the left lung since prior study. No visible effusions or pneumothorax. No acute bony abnormality. IMPRESSION: Interstitial prominence and airspace opacities, left greater than right, worsening on the left since prior study. Electronically Signed   By: Rolm Baptise M.D.   On: 11/23/2020 15:32        Scheduled Meds: . enoxaparin (LOVENOX) injection  40 mg Subcutaneous Q24H  . metoprolol succinate  50 mg Oral Daily   Continuous Infusions: . sodium chloride Stopped (11/22/20 1309)  . remdesivir 100 mg in NS 100 mL 100 mg (11/24/20 0950)     LOS: 3 days    Time spent: over 30 min    Fayrene Helper, MD [facility]   To contact the attending provider between 7A-7P or the covering provider during after hours 7P-7A, please log into the web site [email or web address] and access using universal [place] password for that web site. If you do  not have the password, please call the hospital operator.  11/24/2020, 2:40 PM

## 2020-11-25 DIAGNOSIS — U071 COVID-19: Secondary | ICD-10-CM | POA: Diagnosis not present

## 2020-11-25 DIAGNOSIS — J9601 Acute respiratory failure with hypoxia: Secondary | ICD-10-CM | POA: Diagnosis not present

## 2020-11-25 LAB — CBC WITH DIFFERENTIAL/PLATELET
Abs Immature Granulocytes: 0.06 10*3/uL (ref 0.00–0.07)
Basophils Absolute: 0 10*3/uL (ref 0.0–0.1)
Basophils Relative: 0 %
Eosinophils Absolute: 0 10*3/uL (ref 0.0–0.5)
Eosinophils Relative: 0 %
HCT: 47.3 % — ABNORMAL HIGH (ref 36.0–46.0)
Hemoglobin: 15.4 g/dL — ABNORMAL HIGH (ref 12.0–15.0)
Immature Granulocytes: 1 %
Lymphocytes Relative: 17 %
Lymphs Abs: 1 10*3/uL (ref 0.7–4.0)
MCH: 27.9 pg (ref 26.0–34.0)
MCHC: 32.6 g/dL (ref 30.0–36.0)
MCV: 85.8 fL (ref 80.0–100.0)
Monocytes Absolute: 0.1 10*3/uL (ref 0.1–1.0)
Monocytes Relative: 2 %
Neutro Abs: 4.7 10*3/uL (ref 1.7–7.7)
Neutrophils Relative %: 80 %
Platelets: 227 10*3/uL (ref 150–400)
RBC: 5.51 MIL/uL — ABNORMAL HIGH (ref 3.87–5.11)
RDW: 12.7 % (ref 11.5–15.5)
WBC: 5.9 10*3/uL (ref 4.0–10.5)
nRBC: 0 % (ref 0.0–0.2)

## 2020-11-25 LAB — COMPREHENSIVE METABOLIC PANEL
ALT: 23 U/L (ref 0–44)
AST: 22 U/L (ref 15–41)
Albumin: 3.4 g/dL — ABNORMAL LOW (ref 3.5–5.0)
Alkaline Phosphatase: 58 U/L (ref 38–126)
Anion gap: 12 (ref 5–15)
BUN: 24 mg/dL — ABNORMAL HIGH (ref 8–23)
CO2: 23 mmol/L (ref 22–32)
Calcium: 8.1 mg/dL — ABNORMAL LOW (ref 8.9–10.3)
Chloride: 100 mmol/L (ref 98–111)
Creatinine, Ser: 0.81 mg/dL (ref 0.44–1.00)
GFR, Estimated: >60 mL/min (ref >60)
Glucose, Bld: 174 mg/dL — ABNORMAL HIGH (ref 70–99)
Potassium: 4 mmol/L (ref 3.5–5.1)
Sodium: 135 mmol/L (ref 135–145)
Total Bilirubin: 0.6 mg/dL (ref 0.3–1.2)
Total Protein: 6.8 g/dL (ref 6.5–8.1)

## 2020-11-25 LAB — HEMOGLOBIN A1C
Hgb A1c MFr Bld: 6.3 % — ABNORMAL HIGH (ref 4.8–5.6)
Mean Plasma Glucose: 134.11 mg/dL

## 2020-11-25 LAB — FERRITIN: Ferritin: 250 ng/mL (ref 11–307)

## 2020-11-25 LAB — GLUCOSE, CAPILLARY
Glucose-Capillary: 135 mg/dL — ABNORMAL HIGH (ref 70–99)
Glucose-Capillary: 161 mg/dL — ABNORMAL HIGH (ref 70–99)

## 2020-11-25 LAB — MAGNESIUM: Magnesium: 2.4 mg/dL (ref 1.7–2.4)

## 2020-11-25 LAB — PHOSPHORUS: Phosphorus: 3.6 mg/dL (ref 2.5–4.6)

## 2020-11-25 LAB — D-DIMER, QUANTITATIVE: D-Dimer, Quant: 1.06 ug/mL-FEU — ABNORMAL HIGH (ref 0.00–0.50)

## 2020-11-25 LAB — C-REACTIVE PROTEIN: CRP: 1.8 mg/dL — ABNORMAL HIGH (ref <1.0)

## 2020-11-25 MED ORDER — INSULIN ASPART 100 UNIT/ML ~~LOC~~ SOLN
0.0000 [IU] | Freq: Three times a day (TID) | SUBCUTANEOUS | Status: DC
  Administered 2020-11-25 – 2020-11-26 (×3): 1 [IU] via SUBCUTANEOUS
  Administered 2020-11-26: 2 [IU] via SUBCUTANEOUS
  Administered 2020-11-26 – 2020-11-27 (×3): 1 [IU] via SUBCUTANEOUS
  Administered 2020-11-28 – 2020-11-29 (×4): 2 [IU] via SUBCUTANEOUS
  Administered 2020-11-29 – 2020-11-30 (×2): 1 [IU] via SUBCUTANEOUS
  Administered 2020-11-30: 2 [IU] via SUBCUTANEOUS
  Administered 2020-11-30: 1 [IU] via SUBCUTANEOUS
  Administered 2020-12-01 (×2): 2 [IU] via SUBCUTANEOUS
  Administered 2020-12-02 (×2): 1 [IU] via SUBCUTANEOUS
  Administered 2020-12-03: 2 [IU] via SUBCUTANEOUS

## 2020-11-25 NOTE — Plan of Care (Signed)
  Problem: Respiratory: Goal: Complications related to the disease process, condition or treatment will be avoided or minimized Outcome: Progressing   Problem: Education: Goal: Knowledge of General Education information will improve Description: Including pain rating scale, medication(s)/side effects and non-pharmacologic comfort measures Outcome: Progressing   Problem: Safety: Goal: Ability to remain free from injury will improve Outcome: Progressing

## 2020-11-25 NOTE — Progress Notes (Addendum)
PROGRESS NOTE    Brooke Mora  ZJI:[id number] DOB: [birth year] DOA: 11/20/2020 PCP: Brooke Mccreedy, MD   Chief Complaint  Patient presents with  . Hypoxic   Brief Narrative:  Brooke Mora is Brooke Mora 87 y.o. female with medical history significant of COPD/emphysema, HTN, prior MAI off of treatment since early 2018 (saw Brooke Mora).  Pt presents to the ED at [facility] with c/o SOB, cough.  Pt with COVID exposure 2 weeks ago.  Fatigue and cough for 1 week.  Multiple other family members positive for COVID.  Pt pulse ox at home today in the 80s prompting family to send her to ED for hypoxia.  Pt not normally on O2 at home.  Pt COVID vaccinated.  Pt is actually rather asymptomatic, and despite having O2 sats down into the 80s, says she is breathing as she always does without c/o SOB.  Denies CP, abd pain, fever (subjective), leg pain, leg swelling.  On azithromycin for past few days but no improvement in terms of fatigue and poor PO intake.  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  Assessment & Plan:   Principal Problem:   Acute hypoxemic respiratory failure due to COVID-19 [facility]) Active Problems:   Cavitary lesion of lung   HTN (hypertension)  1. COVID-19 with new O2 requirement - 1. Currently requiring 4 L O2, wean as tolerated (pt with fingernail polish, some has been removed for pulse ox - will discuss with nursing given poor quality pleth at times) 2. CT chest with extensive bilateral ground glass pulm infiltrates, mild emphysema, tumefactive scarring at lung apices bilaterally 3. Steroids (1/25-present), remdesivir (1/25-present).  Consider actemra/baricitinib (given hx of prior MAI, will discuss with ID given her slow improvement). 4. Strict I/O, daily weights 5. Prone as able, OOB, IS, flutter, therapy 6. Continues to require O2 with activity, will continue to monitor on steroids  COVID-19 Labs  Recent Labs     11/23/20 0422 11/23/20 0538 11/24/20 0425 11/25/20 0413  DDIMER 1.12*  --  0.94* 1.06*  FERRITIN  --   --   --  250  CRP  --  1.9* 0.9 1.8*    Lab Results  Component Value Date   SARSCOV2NAA POSITIVE (Brooke Mora) 11/20/2020   2. Hyperglycemia 1. Follow A1c, SSI  3. Hx MAI 1. S/p treatment with ID, Dr. Megan Mora.  Following with pulm as well. 2. Consider discussion with ID if considering actemra/baricitinib  4. OSA 1. CPAP nightly  5. HTN - 1. Cont home metoprolol  DVT prophylaxis: lovenox Code Status: full  Family Communication: none at bedside - granddaughter 1/29 Disposition:   Status is: Inpatient  Remains inpatient appropriate because:Inpatient level of care appropriate due to severity of illness   Dispo: The patient is from: Home              Anticipated d/c is to: Home              Anticipated d/c date is: > 3 days              Patient currently is not medically stable to d/c.   Difficult to place patient No       Consultants:   none  Procedures:  none  Antimicrobials:  Anti-infectives (From admission, onward)   Start     Dose/Rate Route Frequency Ordered Stop   11/22/20 1000  remdesivir 100 mg in sodium chloride 0.9 % 100  mL IVPB        100 mg 200 mL/hr over 30 Minutes Intravenous Daily 11/20/20 2320 11/25/20 0926   11/21/20 0000  remdesivir 100 mg in sodium chloride 0.9 % 100 mL IVPB        100 mg 200 mL/hr over 30 Minutes Intravenous Every 30 min 11/20/20 2320 11/21/20 0140         Subjective: Slept well with cpap, heavy tubes No other complaints Interpreter used  Objective: Vitals:   11/24/20 0942 11/24/20 1525 11/24/20 2053 11/25/20 0333  BP: 133/62 138/71 (!) 156/64 (!) 154/81  Pulse: 66 (!) 59 (!) 58 63  Resp:   19 20  Temp:  98.7 F (37.1 C) 98.4 F (36.9 C) 98.7 F (37.1 C)  TempSrc:  Oral Oral   SpO2:  92% 93% (!) 86%  Weight:      Height:        Intake/Output Summary (Last 24 hours) at 11/25/2020 0938 Last data filed  at 11/24/2020 1526 Gross per 24 hour  Intake 240 ml  Output -  Net 240 ml   Filed Weights   11/22/20 0449 11/23/20 0529 11/24/20 0500  Weight: 82.9 kg 85.2 kg 89.2 kg    Examination:  General: No acute distress. Cardiovascular: Heart sounds show Brooke Mora regular rate, and rhythm.  Lungs: bibasilar crackles, mildly tachypneic Abdomen: Soft, nontender, nondistended  Neurological: Alert and oriented 3. Moves all extremities 4. Cranial nerves II through XII grossly intact. Skin: Warm and dry. No rashes or lesions. Extremities: No clubbing or cyanosis. No edema.     Data Reviewed: I have personally reviewed following labs and imaging studies  CBC: Recent Labs  Lab 11/21/20 0431 11/22/20 0417 11/23/20 0422 11/24/20 0425 11/25/20 0413  WBC 3.7* 4.9 5.9 8.1 5.9  NEUTROABS 2.7 3.4 4.5 5.9 4.7  HGB 14.9 15.7* 16.2* 14.8 15.4*  HCT 46.8* 49.0* 49.1* 45.5 47.3*  MCV 88.5 88.0 85.1 86.2 85.8  PLT 151 213 203 247 301    Basic Metabolic Panel: Recent Labs  Lab 11/21/20 0431 11/22/20 0417 11/23/20 0538 11/24/20 0425 11/25/20 0413  NA 136 135 140 140 135  K 4.0 4.0 4.3 3.8 4.0  CL 102 103 107 105 100  CO2 24 20* 22 25 23   GLUCOSE 173* 172* 148* 120* 174*  BUN 17 23 21  26* 24*  CREATININE 0.87 0.95 0.89 0.77 0.81  CALCIUM 8.1* 8.3* 8.8* 8.2* 8.1*  MG  --  2.3  --  2.3 2.4  PHOS  --  2.9  --  2.6 3.6    GFR: Estimated Creatinine Clearance: 52 mL/min (by C-G formula based on SCr of 0.81 mg/dL).  Liver Function Tests: Recent Labs  Lab 11/21/20 0431 11/22/20 0417 11/23/20 0538 11/24/20 0425 11/25/20 0413  AST 25 28 28 28 22   ALT 18 21 21 23 23   ALKPHOS 67 65 58 55 58  BILITOT 0.3 0.4 0.4 0.6 0.6  PROT 7.1 7.2 7.1 6.4* 6.8  ALBUMIN 3.6 3.6 3.5 3.2* 3.4*    CBG: No results for input(s): GLUCAP in the last 168 hours.   Recent Results (from the past 240 hour(s))  SARS Coronavirus 2 by RT PCR (hospital order, performed in [facility] hospital lab) Nasopharyngeal  Nasopharyngeal Swab     Status: Abnormal   Collection Time: 11/20/20 10:00 PM   Specimen: Nasopharyngeal Swab  Result Value Ref Range Status   SARS Coronavirus 2 POSITIVE (Brooke Mora) NEGATIVE Final    Comment: RESULT CALLED TO, READ  BACK BY AND VERIFIED WITH: KELLIE NEAL RN ON 11/20/20 AT 2305 [facility] (NOTE) SARS-CoV-2 target nucleic acids are DETECTED  SARS-CoV-2 RNA is generally detectable in upper respiratory specimens  during the acute phase of infection.  Positive results are indicative  of the presence of the identified virus, but do not rule out bacterial infection or co-infection with other pathogens not detected by the test.  Clinical correlation with patient history and  other diagnostic information is necessary to determine patient infection status.  The expected result is negative.  Fact Sheet for Patients:   StrictlyIdeas.no   Fact Sheet for Healthcare Providers:   BankingDealers.co.za    This test is not yet approved or cleared by the [place] FDA and  has been authorized for detection and/or diagnosis of SARS-CoV-2 by FDA under an Emergency Use Authorization (EUA).  This EUA will remain in effect (meaning this  test can be used) for the duration of  the COVID-19 declaration under Section 564(b)(1) of the Act, 21 U.S.C. section 360-bbb-3(b)(1), unless the authorization is terminated or revoked sooner.  Performed at [facility], [address] [place], [place] [address]   Blood Culture (routine x 2)     Status: None (Preliminary result)   Collection Time: 11/20/20 11:00 PM   Specimen: BLOOD  Result Value Ref Range Status   Specimen Description   Final    BLOOD LEFT ANTECUBITAL Performed at [facility], [address] [place], [place] [address]    Special Requests   Final    BOTTLES DRAWN AEROBIC AND ANAEROBIC Blood Culture adequate volume Performed at [facility], [address] [place], [place] [address]    Culture   Final    NO GROWTH 4 DAYS Performed at [facility], [place] [address], [place], [place] [address]    Report Status PENDING  Incomplete  Blood Culture (routine x 2)     Status: None (Preliminary result)   Collection Time: 11/20/20 11:40 PM   Specimen: BLOOD RIGHT HAND  Result Value Ref Range Status   Specimen Description BLOOD RIGHT HAND  Final   Special Requests   Final    BOTTLES DRAWN AEROBIC AND ANAEROBIC Blood Culture adequate volume   Culture   Final    NO GROWTH 4 DAYS Performed at [facility], [place] [address] [place], [place] [address]    Report Status PENDING  Incomplete         Radiology Studies: DG CHEST PORT 1 VIEW  Result Date: 11/23/2020 CLINICAL DATA:  COPD, shortness of breath, cough EXAM: PORTABLE CHEST 1 VIEW COMPARISON:  11/20/2020 FINDINGS: Heart is normal size. Interstitial prominence and patchy opacities throughout the lungs, left greater than right. Findings slightly worsened in the left lung since prior study. No visible effusions or pneumothorax. No acute bony abnormality. IMPRESSION: Interstitial prominence and airspace opacities, left greater than right, worsening on the left since prior study. Electronically Signed   By: Rolm Baptise M.D.   On: 11/23/2020 15:32        Scheduled Meds: . enoxaparin (LOVENOX) injection  40 mg Subcutaneous Q24H  . insulin aspart  0-9 Units Subcutaneous TID WC  . methylPREDNISolone (SOLU-MEDROL) injection  40 mg Intravenous BID  . metoprolol succinate  50 mg Oral Daily   Continuous Infusions: . sodium chloride Stopped (11/22/20 1309)     LOS: 4 days    Time spent: over 30 min  Fayrene Helper, MD [facility]   To contact the attending provider between 7A-7P or the covering provider during after hours 7P-7A, please log into the web site [email or web address] and access using universal [place] password for that web site. If you do not have the password,  please call the hospital operator.  11/25/2020, 9:38 AM

## 2020-11-26 ENCOUNTER — Inpatient Hospital Stay (HOSPITAL_COMMUNITY): Payer: Medicaid Other

## 2020-11-26 DIAGNOSIS — J9601 Acute respiratory failure with hypoxia: Secondary | ICD-10-CM | POA: Diagnosis not present

## 2020-11-26 DIAGNOSIS — U071 COVID-19: Secondary | ICD-10-CM | POA: Diagnosis not present

## 2020-11-26 LAB — GLUCOSE, CAPILLARY
Glucose-Capillary: 148 mg/dL — ABNORMAL HIGH (ref 70–99)
Glucose-Capillary: 138 mg/dL — ABNORMAL HIGH (ref 70–99)
Glucose-Capillary: 143 mg/dL — ABNORMAL HIGH (ref 70–99)
Glucose-Capillary: 164 mg/dL — ABNORMAL HIGH (ref 70–99)
Glucose-Capillary: 152 mg/dL — ABNORMAL HIGH (ref 70–99)

## 2020-11-26 LAB — FERRITIN: Ferritin: 244 ng/mL (ref 11–307)

## 2020-11-26 LAB — CULTURE, BLOOD (ROUTINE X 2)
Culture: NO GROWTH
Culture: NO GROWTH
Special Requests: ADEQUATE
Special Requests: ADEQUATE

## 2020-11-26 LAB — CBC WITH DIFFERENTIAL/PLATELET
Abs Immature Granulocytes: 0.17 10*3/uL — ABNORMAL HIGH (ref 0.00–0.07)
Basophils Absolute: 0 10*3/uL (ref 0.0–0.1)
Basophils Relative: 0 %
Eosinophils Absolute: 0 10*3/uL (ref 0.0–0.5)
Eosinophils Relative: 0 %
HCT: 44.9 % (ref 36.0–46.0)
Hemoglobin: 15.2 g/dL — ABNORMAL HIGH (ref 12.0–15.0)
Immature Granulocytes: 2 %
Lymphocytes Relative: 11 %
Lymphs Abs: 1 10*3/uL (ref 0.7–4.0)
MCH: 28.4 pg (ref 26.0–34.0)
MCHC: 33.9 g/dL (ref 30.0–36.0)
MCV: 83.8 fL (ref 80.0–100.0)
Monocytes Absolute: 0.4 10*3/uL (ref 0.1–1.0)
Monocytes Relative: 4 %
Neutro Abs: 8.1 10*3/uL — ABNORMAL HIGH (ref 1.7–7.7)
Neutrophils Relative %: 83 %
Platelets: 232 10*3/uL (ref 150–400)
RBC: 5.36 MIL/uL — ABNORMAL HIGH (ref 3.87–5.11)
RDW: 12.8 % (ref 11.5–15.5)
WBC: 9.7 10*3/uL (ref 4.0–10.5)
nRBC: 0 % (ref 0.0–0.2)

## 2020-11-26 LAB — COMPREHENSIVE METABOLIC PANEL
ALT: 17 U/L (ref 0–44)
AST: 23 U/L (ref 15–41)
Albumin: 3.1 g/dL — ABNORMAL LOW (ref 3.5–5.0)
Alkaline Phosphatase: 55 U/L (ref 38–126)
Anion gap: 12 (ref 5–15)
BUN: 23 mg/dL (ref 8–23)
CO2: 25 mmol/L (ref 22–32)
Calcium: 8.1 mg/dL — ABNORMAL LOW (ref 8.9–10.3)
Chloride: 100 mmol/L (ref 98–111)
Creatinine, Ser: 0.85 mg/dL (ref 0.44–1.00)
GFR, Estimated: >60 mL/min (ref >60)
Glucose, Bld: 171 mg/dL — ABNORMAL HIGH (ref 70–99)
Potassium: 4.4 mmol/L (ref 3.5–5.1)
Sodium: 137 mmol/L (ref 135–145)
Total Bilirubin: 1 mg/dL (ref 0.3–1.2)
Total Protein: 6.3 g/dL — ABNORMAL LOW (ref 6.5–8.1)

## 2020-11-26 LAB — MAGNESIUM: Magnesium: 2.3 mg/dL (ref 1.7–2.4)

## 2020-11-26 LAB — PHOSPHORUS: Phosphorus: 3.4 mg/dL (ref 2.5–4.6)

## 2020-11-26 LAB — C-REACTIVE PROTEIN: CRP: 0.9 mg/dL (ref <1.0)

## 2020-11-26 LAB — D-DIMER, QUANTITATIVE: D-Dimer, Quant: 1.01 ug/mL-FEU — ABNORMAL HIGH (ref 0.00–0.50)

## 2020-11-26 NOTE — Progress Notes (Signed)
Pt refused CPAP qhs.  Pt stated that she prefers to wear her O2 tonight while sleeping.  Pt encouraged to contact RT should she change her mind.

## 2020-11-26 NOTE — Progress Notes (Signed)
PROGRESS NOTE    Brooke Mora  [id number]:[id number] DOB: 30-Sep-1929 DOA: 11/20/2020 PCP: Benito Mccreedy, MD   Chief Complaint  Patient presents with  . Hypoxic   Brief Narrative:  Brooke Mora is Brooke Mora 87 y.o. female with medical history significant of COPD/emphysema, HTN, prior MAI off of treatment since early 2018 (saw Brooke Mora).  Pt presents to the ED at [facility] with c/o SOB, cough.  Pt with COVID exposure 2 weeks ago.  Fatigue and cough for 1 week.  Multiple other family members positive for COVID.  Pt pulse ox at home today in the 80s prompting family to send her to ED for hypoxia.  Pt not normally on O2 at home.  Pt COVID vaccinated.  Pt is actually rather asymptomatic, and despite having O2 sats down into the 80s, says she is breathing as she always does without c/o SOB.  Denies CP, abd pain, fever (subjective), leg pain, leg swelling.  On azithromycin for past few days but no improvement in terms of fatigue and poor PO intake.  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  Assessment & Plan:   Principal Problem:   Acute hypoxemic respiratory failure due to COVID-19 [facility]) Active Problems:   Cavitary lesion of lung   HTN (hypertension)  1. COVID-19 with new O2 requirement - 1. Currently requiring 4 L O2, wean as tolerated  2. CT chest with extensive bilateral ground glass pulm infiltrates, mild emphysema, tumefactive scarring at lung apices bilaterally 3. CXR 1/31 viral pneumonia  4. Steroids (1/25-present), remdesivir (1/25-present).  Consider actemra/baricitinib (given hx of prior MAI, discussed with ID who recommended avoiding this if possible). 5. Strict I/O, daily weights 6. Prone as able, OOB, IS, flutter, therapy 7. Continues to require O2 with activity, will continue to monitor on steroids  COVID-19 Labs  Recent Labs    11/24/20 0425 11/25/20 0413 11/26/20 0410  DDIMER 0.94* 1.06* 1.01*   FERRITIN  --  250 244  CRP 0.9 1.8* 0.9    Lab Results  Component Value Date   SARSCOV2NAA POSITIVE (Brooke Mora) 11/20/2020   2. Prediabetes  Steroid Induced Hyperglycemia 1. Follow A1c (6.3), SSI  3. Hx MAI 1. S/p treatment with ID, Dr. Megan Mora.  Following with pulm as well. 2. Consider discussion with ID if considering actemra/baricitinib (as above)  4. OSA 1. CPAP nightly  5. HTN - 1. Cont home metoprolol  DVT prophylaxis: lovenox Code Status: full  Family Communication: none at bedside - granddaughter 1/29 Disposition:   Status is: Inpatient  Remains inpatient appropriate because:Inpatient level of care appropriate due to severity of illness   Dispo: The patient is from: Home              Anticipated d/c is to: Home              Anticipated d/c date is: > 3 days              Patient currently is not medically stable to d/c.   Difficult to place patient No       Consultants:   none  Procedures:  none  Antimicrobials:  Anti-infectives (From admission, onward)   Start     Dose/Rate Route Frequency Ordered Stop   11/22/20 1000  remdesivir 100 mg in sodium chloride 0.9 % 100 mL IVPB        100 mg 200 mL/hr over 30 Minutes Intravenous Daily 11/20/20 2320  11/25/20 0926   11/21/20 0000  remdesivir 100 mg in sodium chloride 0.9 % 100 mL IVPB        100 mg 200 mL/hr over 30 Minutes Intravenous Every 30 min 11/20/20 2320 11/21/20 0140         Subjective: No new complaints, feels like she's getting better Interpreter used  Objective: Vitals:   11/25/20 1336 11/25/20 2000 11/26/20 0445 11/26/20 0908  BP: 117/61 (!) 156/61 (!) 106/96   Pulse: 69 61 (!) 59 78  Resp: 18 17 19    Temp: 98.4 F (36.9 C) 97.8 F (36.6 C) 97.7 F (36.5 C)   TempSrc: Oral Oral Oral   SpO2: (!) 89% (!) 89% 92% 90%  Weight:   88.4 kg   Height:       No intake or output data in the 24 hours ending 11/26/20 0944 Filed Weights   11/23/20 0529 11/24/20 0500 11/26/20 0445   Weight: 85.2 kg 89.2 kg 88.4 kg    Examination:  General: No acute distress.  Seen in bed for the first time, rather than in the chair.  87 yo, appears younger than stated age, though looking more tired over recent days. Cardiovascular: Heart sounds show Brooke Mora regular rate, and rhythm Lungs: Bibasilar crackles Abdomen: Soft, nontender, nondistended  Neurological: Alert and oriented 3. Moves all extremities 4. Cranial nerves II through XII grossly intact. Skin: Warm and dry. No rashes or lesions. Extremities: No clubbing or cyanosis. No edema.     Data Reviewed: I have personally reviewed following labs and imaging studies  CBC: Recent Labs  Lab 11/22/20 0417 11/23/20 0422 11/24/20 0425 11/25/20 0413 11/26/20 0410  WBC 4.9 5.9 8.1 5.9 9.7  NEUTROABS 3.4 4.5 5.9 4.7 8.1*  HGB 15.7* 16.2* 14.8 15.4* 15.2*  HCT 49.0* 49.1* 45.5 47.3* 44.9  MCV 88.0 85.1 86.2 85.8 83.8  PLT 213 203 247 227 824    Basic Metabolic Panel: Recent Labs  Lab 11/22/20 0417 11/23/20 0538 11/24/20 0425 11/25/20 0413 11/26/20 0410  NA 135 140 140 135 137  K 4.0 4.3 3.8 4.0 4.4  CL 103 107 105 100 100  CO2 20* 22 25 23 25   GLUCOSE 172* 148* 120* 174* 171*  BUN 23 21 26* 24* 23  CREATININE 0.95 0.89 0.77 0.81 0.85  CALCIUM 8.3* 8.8* 8.2* 8.1* 8.1*  MG 2.3  --  2.3 2.4 2.3  PHOS 2.9  --  2.6 3.6 3.4    GFR: Estimated Creatinine Clearance: 49.3 mL/min (by C-G formula based on SCr of 0.85 mg/dL).  Liver Function Tests: Recent Labs  Lab 11/22/20 0417 11/23/20 0538 11/24/20 0425 11/25/20 0413 11/26/20 0410  AST 28 28 28 22 23   ALT 21 21 23 23 17   ALKPHOS 65 58 55 58 55  BILITOT 0.4 0.4 0.6 0.6 1.0  PROT 7.2 7.1 6.4* 6.8 6.3*  ALBUMIN 3.6 3.5 3.2* 3.4* 3.1*    CBG: Recent Labs  Lab 11/25/20 1701 11/25/20 2002 11/26/20 0732  GLUCAP 135* 161* 138*     Recent Results (from the past 240 hour(s))  SARS Coronavirus 2 by RT PCR (hospital order, performed in [place] hospital  lab) Nasopharyngeal Nasopharyngeal Swab     Status: Abnormal   Collection Time: 11/20/20 10:00 PM   Specimen: Nasopharyngeal Swab  Result Value Ref Range Status   SARS Coronavirus 2 POSITIVE (Brooke Mora) NEGATIVE Final    Comment: RESULT CALLED TO, READ BACK BY AND VERIFIED WITH: KELLIE NEAL RN ON 11/20/20 AT 2305  [place] (NOTE) SARS-CoV-2 target nucleic acids are DETECTED  SARS-CoV-2 RNA is generally detectable in upper respiratory specimens  during the acute phase of infection.  Positive results are indicative  of the presence of the identified virus, but do not rule out bacterial infection or co-infection with other pathogens not detected by the test.  Clinical correlation with patient history and  other diagnostic information is necessary to determine patient infection status.  The expected result is negative.  Fact Sheet for Patients:   StrictlyIdeas.no   Fact Sheet for Healthcare Providers:   BankingDealers.co.za    This test is not yet approved or cleared by the [place] FDA and  has been authorized for detection and/or diagnosis of SARS-CoV-2 by FDA under an Emergency Use Authorization (EUA).  This EUA will remain in effect (meaning this  test can be used) for the duration of  the COVID-19 declaration under Section 564(b)(1) of the Act, 21 U.S.C. section 360-bbb-3(b)(1), unless the authorization is terminated or revoked sooner.  Performed at [facility], [address] [place], [place] [address]   Blood Culture (routine x 2)     Status: None (Preliminary result)   Collection Time: 11/20/20 11:00 PM   Specimen: BLOOD  Result Value Ref Range Status   Specimen Description   Final    BLOOD LEFT ANTECUBITAL Performed at [facility], [address] [place], [place] [address]    Special Requests   Final    BOTTLES DRAWN AEROBIC AND ANAEROBIC Blood Culture adequate volume Performed at [facility],  [address] [place], [place] [address]    Culture   Final    NO GROWTH 4 DAYS Performed at [facility], [place] [address] [place], [place] [address]    Report Status PENDING  Incomplete  Blood Culture (routine x 2)     Status: None (Preliminary result)   Collection Time: 11/20/20 11:40 PM   Specimen: BLOOD RIGHT HAND  Result Value Ref Range Status   Specimen Description BLOOD RIGHT HAND  Final   Special Requests   Final    BOTTLES DRAWN AEROBIC AND ANAEROBIC Blood Culture adequate volume   Culture   Final    NO GROWTH 4 DAYS Performed at [facility], [place] [address] [place], [place] [address]    Report Status PENDING  Incomplete         Radiology Studies: DG CHEST PORT 1 VIEW  Result Date: 11/26/2020 CLINICAL DATA:  Hypoxia EXAM: PORTABLE CHEST 1 VIEW COMPARISON:  11/23/2020 FINDINGS: Hazy opacity of the chest asymmetric to the left but bilateral by most recent chest CT. Normal heart size. Aortic tortuosity. No visible effusion or pneumothorax. IMPRESSION: Viral pneumonia with stable appearance since 3 days ago. Electronically Signed   By: Monte Fantasia M.D.   On: 11/26/2020 05:59        Scheduled Meds: . enoxaparin (LOVENOX) injection  40 mg Subcutaneous Q24H  . insulin aspart  0-9 Units Subcutaneous TID WC  . methylPREDNISolone (SOLU-MEDROL) injection  40 mg Intravenous BID  . metoprolol succinate  50 mg Oral Daily   Continuous Infusions: . sodium chloride Stopped (11/22/20 1309)     LOS: 5 days    Time spent: over 30 min    Fayrene Helper, MD [facility]   To contact the attending provider between 7A-7P or the covering provider during after hours 7P-7A, please log into the web site [email or web address] and access using  universal [place] password for that web site. If you do not have the password, please call the hospital operator.  11/26/2020, 9:44 AM

## 2020-11-27 ENCOUNTER — Inpatient Hospital Stay (HOSPITAL_COMMUNITY): Payer: Medicaid Other

## 2020-11-27 DIAGNOSIS — U071 COVID-19: Secondary | ICD-10-CM | POA: Diagnosis not present

## 2020-11-27 DIAGNOSIS — J9601 Acute respiratory failure with hypoxia: Secondary | ICD-10-CM | POA: Diagnosis not present

## 2020-11-27 LAB — CBC WITH DIFFERENTIAL/PLATELET
Abs Immature Granulocytes: 0.23 10*3/uL — ABNORMAL HIGH (ref 0.00–0.07)
Basophils Absolute: 0 10*3/uL (ref 0.0–0.1)
Basophils Relative: 0 %
Eosinophils Absolute: 0 10*3/uL (ref 0.0–0.5)
Eosinophils Relative: 0 %
HCT: 47.5 % — ABNORMAL HIGH (ref 36.0–46.0)
Hemoglobin: 15.3 g/dL — ABNORMAL HIGH (ref 12.0–15.0)
Immature Granulocytes: 2 %
Lymphocytes Relative: 10 %
Lymphs Abs: 1 10*3/uL (ref 0.7–4.0)
MCH: 27.8 pg (ref 26.0–34.0)
MCHC: 32.2 g/dL (ref 30.0–36.0)
MCV: 86.4 fL (ref 80.0–100.0)
Monocytes Absolute: 0.4 10*3/uL (ref 0.1–1.0)
Monocytes Relative: 4 %
Neutro Abs: 8.9 10*3/uL — ABNORMAL HIGH (ref 1.7–7.7)
Neutrophils Relative %: 84 %
Platelets: 303 10*3/uL (ref 150–400)
RBC: 5.5 MIL/uL — ABNORMAL HIGH (ref 3.87–5.11)
RDW: 12.7 % (ref 11.5–15.5)
WBC: 10.6 10*3/uL — ABNORMAL HIGH (ref 4.0–10.5)
nRBC: 0 % (ref 0.0–0.2)

## 2020-11-27 LAB — COMPREHENSIVE METABOLIC PANEL
ALT: 20 U/L (ref 0–44)
AST: 19 U/L (ref 15–41)
Albumin: 3.2 g/dL — ABNORMAL LOW (ref 3.5–5.0)
Alkaline Phosphatase: 55 U/L (ref 38–126)
Anion gap: 11 (ref 5–15)
BUN: 27 mg/dL — ABNORMAL HIGH (ref 8–23)
CO2: 26 mmol/L (ref 22–32)
Calcium: 8.1 mg/dL — ABNORMAL LOW (ref 8.9–10.3)
Chloride: 101 mmol/L (ref 98–111)
Creatinine, Ser: 0.82 mg/dL (ref 0.44–1.00)
GFR, Estimated: >60 mL/min (ref >60)
Glucose, Bld: 181 mg/dL — ABNORMAL HIGH (ref 70–99)
Potassium: 4.5 mmol/L (ref 3.5–5.1)
Sodium: 138 mmol/L (ref 135–145)
Total Bilirubin: 0.6 mg/dL (ref 0.3–1.2)
Total Protein: 6.3 g/dL — ABNORMAL LOW (ref 6.5–8.1)

## 2020-11-27 LAB — GLUCOSE, CAPILLARY
Glucose-Capillary: 131 mg/dL — ABNORMAL HIGH (ref 70–99)
Glucose-Capillary: 116 mg/dL — ABNORMAL HIGH (ref 70–99)
Glucose-Capillary: 148 mg/dL — ABNORMAL HIGH (ref 70–99)

## 2020-11-27 LAB — MAGNESIUM: Magnesium: 2.5 mg/dL — ABNORMAL HIGH (ref 1.7–2.4)

## 2020-11-27 LAB — PHOSPHORUS: Phosphorus: 3.7 mg/dL (ref 2.5–4.6)

## 2020-11-27 LAB — FERRITIN: Ferritin: 231 ng/mL (ref 11–307)

## 2020-11-27 LAB — D-DIMER, QUANTITATIVE: D-Dimer, Quant: 0.98 ug/mL-FEU — ABNORMAL HIGH (ref 0.00–0.50)

## 2020-11-27 LAB — C-REACTIVE PROTEIN: CRP: 0.7 mg/dL (ref <1.0)

## 2020-11-27 LAB — PROCALCITONIN: Procalcitonin: <0.1 ng/mL

## 2020-11-27 MED ORDER — FUROSEMIDE 10 MG/ML IJ SOLN
20.0000 mg | Freq: Once | INTRAMUSCULAR | Status: AC
  Administered 2020-11-27: 20 mg via INTRAVENOUS
  Filled 2020-11-27: qty 2

## 2020-11-27 NOTE — Progress Notes (Signed)
Pt refusing CPAP at this time, pt wants to wear O2.  RN aware.

## 2020-11-27 NOTE — Progress Notes (Addendum)
PROGRESS NOTE    Brooke Mora  PJA:[id number] DOB: 27-Nov-1928 DOA: 11/20/2020 PCP: Benito Mccreedy, MD   Chief Complaint  Patient presents with  . Hypoxic   Brief Narrative:  Brooke Mora is Brooke Mora 87 y.o. female with medical history significant of COPD/emphysema, HTN, prior MAI off of treatment since early 2018 (saw [name]).  She's been admitted for covid pneumonia.  She has continued oxygen requirement.  Has required ~4 L past several days.  She'd initially looked like she might be ready for discharge late last week, but her O2 requirement has persistent and she appears weaker as well.  Continue steroids.  Completed remdesivir.  Holding off on actemra/baricitinib as she has hx of MAI and has only modest O2 requirement at this time.  Assessment & Plan:   Principal Problem:   Acute hypoxemic respiratory failure due to COVID-19 [facility]) Active Problems:   Cavitary lesion of lung   HTN (hypertension)  1. COVID-19 with new O2 requirement - 1. Currently requiring 4 L O2, wean as tolerated - did worse with therapy today than last week (needed more supplemental o2 with activity) 2. CT chest with extensive bilateral ground glass pulm infiltrates, mild emphysema, tumefactive scarring at lung apices bilaterally 3. CXR 1/31 viral pneumonia  4. Steroids (1/25-present), remdesivir (1/25-30).  Consider actemra/baricitinib (given hx of prior MAI, discussed with ID who recommended avoiding this if possible - if she declines or has increasing O2 requirement, would be worth considering this ). 5. Strict I/O, daily weights - net positive, trial lasix 6. Prone as able, OOB, IS, flutter, therapy  COVID-19 Labs  Recent Labs    11/25/20 0413 11/26/20 0410 11/27/20 0404  DDIMER 1.06* 1.01* 0.98*  FERRITIN 250 244 231  CRP 1.8* 0.9 0.7    Lab Results  Component Value Date   SARSCOV2NAA POSITIVE (Jamiyah Dingley) 11/20/2020   2. Prediabetes  Steroid Induced Hyperglycemia 1. Follow A1c (6.3), SSI  3. Hx  MAI 1. S/p treatment with ID, Dr. [name].  Following with pulm as well. 2. Consider discussion with ID if considering actemra/baricitinib (as above)  4. OSA 1. CPAP nightly  5. HTN - 1. Cont home metoprolol  DVT prophylaxis: lovenox Code Status: full  Family Communication: none at bedside - granddaughter 2/1 Disposition:   Status is: Inpatient  Remains inpatient appropriate because:Inpatient level of care appropriate due to severity of illness   Dispo: The patient is from: Home              Anticipated d/c is to: Home              Anticipated d/c date is: > 3 days              Patient currently is not medically stable to d/c.   Difficult to place patient No       Consultants:   none  Procedures:  none  Antimicrobials:  Anti-infectives (From admission, onward)   Start     Dose/Rate Route Frequency Ordered Stop   11/22/20 1000  remdesivir 100 mg in sodium chloride 0.9 % 100 mL IVPB        100 mg 200 mL/hr over 30 Minutes Intravenous Daily 11/20/20 2320 11/25/20 0926   11/21/20 0000  remdesivir 100 mg in sodium chloride 0.9 % 100 mL IVPB        100 mg 200 mL/hr over 30 Minutes Intravenous Every 30 min 11/20/20 2320 11/21/20 0140         Subjective: No  new complaints Says she feels better (discussed, that she doesn't necessary look better, looks weaker) - she acknowledged maybe feeling weaker, but not clearly   Interpreter used  Objective: Vitals:   11/26/20 2030 11/27/20 0631 11/27/20 0905 11/27/20 1409  BP: (!) 106/93 (!) 132/92  118/63  Pulse: (!) 57 (!) 56 82 63  Resp: 20 17  16   Temp: 97.8 F (36.6 C) 97.7 F (36.5 C)  97.8 F (36.6 C)  TempSrc: Oral Oral  Oral  SpO2: 90% 93% (!) 86% (!) 88%  Weight:      Height:       No intake or output data in the 24 hours ending 11/27/20 1641 Filed Weights   11/23/20 0529 11/24/20 0500 11/26/20 0445  Weight: 85.2 kg 89.2 kg 88.4 kg    Examination:  General: No acute distress. Appears more tired,  weaker than previous days - 87 yo appears younger than stated age. Cardiovascular: Heart sounds show Raedyn Klinck regular rate, and rhythm Lungs: Clear to auscultation bilaterally  Abdomen: Soft, nontender, nondistended  Neurological: Alert and oriented 3. Moves all extremities 4. Cranial nerves II through XII grossly intact. Skin: Warm and dry. No rashes or lesions. Extremities: No clubbing or cyanosis. No edema.   Data Reviewed: I have personally reviewed following labs and imaging studies  CBC: Recent Labs  Lab 11/23/20 0422 11/24/20 0425 11/25/20 0413 11/26/20 0410 11/27/20 0404  WBC 5.9 8.1 5.9 9.7 10.6*  NEUTROABS 4.5 5.9 4.7 8.1* 8.9*  HGB 16.2* 14.8 15.4* 15.2* 15.3*  HCT 49.1* 45.5 47.3* 44.9 47.5*  MCV 85.1 86.2 85.8 83.8 86.4  PLT 203 247 227 232 182    Basic Metabolic Panel: Recent Labs  Lab 11/22/20 0417 11/23/20 0538 11/24/20 0425 11/25/20 0413 11/26/20 0410 11/27/20 0404  NA 135 140 140 135 137 138  K 4.0 4.3 3.8 4.0 4.4 4.5  CL 103 107 105 100 100 101  CO2 20* 22 25 23 25 26   GLUCOSE 172* 148* 120* 174* 171* 181*  BUN 23 21 26* 24* 23 27*  CREATININE 0.95 0.89 0.77 0.81 0.85 0.82  CALCIUM 8.3* 8.8* 8.2* 8.1* 8.1* 8.1*  MG 2.3  --  2.3 2.4 2.3 2.5*  PHOS 2.9  --  2.6 3.6 3.4 3.7    GFR: Estimated Creatinine Clearance: 51.1 mL/min (by C-G formula based on SCr of 0.82 mg/dL).  Liver Function Tests: Recent Labs  Lab 11/23/20 0538 11/24/20 0425 11/25/20 0413 11/26/20 0410 11/27/20 0404  AST 28 28 22 23 19   ALT 21 23 23 17 20   ALKPHOS 58 55 58 55 55  BILITOT 0.4 0.6 0.6 1.0 0.6  PROT 7.1 6.4* 6.8 6.3* 6.3*  ALBUMIN 3.5 3.2* 3.4* 3.1* 3.2*    CBG: Recent Labs  Lab 11/26/20 1141 11/26/20 1642 11/26/20 2237 11/27/20 0735 11/27/20 1141  GLUCAP 143* 164* 152* 131* 116*     Recent Results (from the past 240 hour(s))  SARS Coronavirus 2 by RT PCR (hospital order, performed in [place] hospital lab) Nasopharyngeal Nasopharyngeal Swab      Status: Abnormal   Collection Time: 11/20/20 10:00 PM   Specimen: Nasopharyngeal Swab  Result Value Ref Range Status   SARS Coronavirus 2 POSITIVE (Neilson Oehlert) NEGATIVE Final    Comment: RESULT CALLED TO, READ BACK BY AND VERIFIED WITH: KELLIE NEAL RN ON 11/20/20 AT 2305 [facility] (NOTE) SARS-CoV-2 target nucleic acids are DETECTED  SARS-CoV-2 RNA is generally detectable in upper respiratory specimens  during the acute phase of infection.  Positive results are indicative  of the presence of the identified virus, but do not rule out bacterial infection or co-infection with other pathogens not detected by the test.  Clinical correlation with patient history and  other diagnostic information is necessary to determine patient infection status.  The expected result is negative.  Fact Sheet for Patients:   StrictlyIdeas.no   Fact Sheet for Healthcare Providers:   BankingDealers.co.za    This test is not yet approved or cleared by the [place] FDA and  has been authorized for detection and/or diagnosis of SARS-CoV-2 by FDA under an Emergency Use Authorization (EUA).  This EUA will remain in effect (meaning this  test can be used) for the duration of  the COVID-19 declaration under Section 564(b)(1) of the Act, 21 U.S.C. section 360-bbb-3(b)(1), unless the authorization is terminated or revoked sooner.  Performed at [facility], [address] [place], [place] [address]   Blood Culture (routine x 2)     Status: None   Collection Time: 11/20/20 11:00 PM   Specimen: BLOOD  Result Value Ref Range Status   Specimen Description   Final    BLOOD LEFT ANTECUBITAL Performed at [facility], [address] [place], [place] [address]    Special Requests   Final    BOTTLES DRAWN AEROBIC AND ANAEROBIC Blood Culture adequate volume Performed at [facility], [address] [place], [place] [address]    Culture   Final     NO GROWTH 5 DAYS Performed at [facility], [place] [address], [place], [place] [address]    Report Status 11/26/2020 FINAL  Final  Blood Culture (routine x 2)     Status: None   Collection Time: 11/20/20 11:40 PM   Specimen: BLOOD RIGHT HAND  Result Value Ref Range Status   Specimen Description BLOOD RIGHT HAND  Final   Special Requests   Final    BOTTLES DRAWN AEROBIC AND ANAEROBIC Blood Culture adequate volume   Culture   Final    NO GROWTH 5 DAYS Performed at [facility], [place] [address] [place], [place] [address]    Report Status 11/26/2020 FINAL  Final         Radiology Studies: DG CHEST PORT 1 VIEW  Result Date: 11/27/2020 CLINICAL DATA:  Shortness of breath, COVID-19 positive. EXAM: PORTABLE CHEST 1 VIEW COMPARISON:  November 26, 2020. FINDINGS: The heart size and mediastinal contours are within normal limits. No pneumothorax or pleural effusion is noted. Bilateral upper lobe lung opacities are noted, left greater than right, consistent with multifocal pneumonia. The visualized skeletal structures are unremarkable. IMPRESSION: Bilateral upper lobe pneumonia, left greater than right. Electronically Signed   By: Marijo Conception M.D.   On: 11/27/2020 15:34   DG CHEST PORT 1 VIEW  Result Date: 11/26/2020 CLINICAL DATA:  Hypoxia EXAM: PORTABLE CHEST 1 VIEW COMPARISON:  11/23/2020 FINDINGS: Hazy opacity of the chest asymmetric to the left but bilateral by most recent chest CT. Normal heart size. Aortic tortuosity. No visible effusion or pneumothorax. IMPRESSION: Viral pneumonia with stable appearance since 3 days ago. Electronically Signed   By: Monte Fantasia M.D.   On: 11/26/2020 05:59        Scheduled Meds: . enoxaparin (LOVENOX) injection  40 mg Subcutaneous Q24H  . insulin aspart  0-9 Units Subcutaneous TID WC  . methylPREDNISolone (SOLU-MEDROL) injection  40 mg Intravenous BID  . metoprolol succinate  50 mg Oral Daily   Continuous Infusions: . sodium  chloride Stopped (11/22/20 1309)     LOS: 6 days    Time spent: over 30 min    Fayrene Helper, MD [facility]   To contact the attending provider between 7A-7P or the covering provider during after hours 7P-7A, please log into the web site [email or web address] and access using universal [place] password for that web site. If you do not have the password, please call the hospital operator.  11/27/2020, 4:41 PM

## 2020-11-27 NOTE — Progress Notes (Addendum)
Physical Therapy Treatment Patient Details Name: Brooke Mora MRN: [id number] DOB: 08/11/1929 Today's Date: 11/27/2020    History of Present Illness Pt is a 87 y.o. 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  She has been admitted with COVID 19 with new O2 needs,    PT Comments    Pt ambulated 120' without an assistive device, and had no loss of balance. SaO2 88% on 4L O2 at rest, 83% on 4L O2 walking, instructed pt in pursed lip breathing. Even with good breathing technique it took ~7 minutes of seated rest for her O2 levels to recover after walking. Pt did not appear to be in any distress, 1/4 dyspnea.  HR in 70s while ambulating.  Increased supplemental O2 required today compared to last PT session.    Follow Up Recommendations  No PT follow up     Equipment Recommendations  Rolling walker with 5" wheels    Recommendations for Other Services       Precautions / Restrictions Precautions Precautions: Other (comment) Precaution Comments: monitor O2 Restrictions Weight Bearing Restrictions: No    Mobility  Bed Mobility               General bed mobility comments: in chair and returned to chair  Transfers Overall transfer level: Modified independent Equipment used: None Transfers: Sit to/from Stand Sit to Stand: Modified independent (Device/Increase time)         General transfer comment: no LOB, used armrests of recliner  Ambulation/Gait Ambulation/Gait assistance: Supervision Gait Distance (Feet): 120 Feet Assistive device: None Gait Pattern/deviations: Step-through pattern;Decreased stride length Gait velocity: decreased   General Gait Details: no loss of balance, SaO2 88% on 4L at rest, 83% walking with 4L, instructed pt in pursed lip breathing, It took ~7 minutes for SaO2 to recover with seated rest after walking   Stairs             Wheelchair Mobility    Modified Rankin (Stroke Patients Only)        Balance     Sitting balance-Leahy Scale: Normal     Standing balance support: No upper extremity supported Standing balance-Leahy Scale: Good                              Cognition Arousal/Alertness: Awake/alert Behavior During Therapy: WFL for tasks assessed/performed Overall Cognitive Status: No family/caregiver present to determine baseline cognitive functioning                                 General Comments: increased time to respond to some questions (in both [place] and [place]), unclear if pt is [facility]?, pleasant, followed commands      Exercises General Exercises - Upper Extremity Shoulder Flexion: AROM;Both;10 reps;Seated General Exercises - Lower Extremity Ankle Circles/Pumps: AROM;Both;10 reps;Seated Long Arc Quad: AROM;Both;10 reps;Seated Hip Flexion/Marching: AROM;Both;10 reps;Seated    General Comments        Pertinent Vitals/Pain Pain Assessment: No/denies pain    Home Living                      Prior Function            PT Goals (current goals can now be found in the care plan section) Acute Rehab PT Goals Patient Stated Goal: return home Time For Goal Achievement: 12/06/20 Potential  to Achieve Goals: Good Additional Goals Additional Goal #1: Will score 19 or > on DGI to indicate low fall risk. Progress towards PT goals: Progressing toward goals    Frequency    Min 3X/week      PT Plan Current plan remains appropriate    Co-evaluation              AM-PAC PT "6 Clicks" Mobility   Outcome Measure  Help needed turning from your back to your side while in a flat bed without using bedrails?: A Little Help needed moving from lying on your back to sitting on the side of a flat bed without using bedrails?: A Little Help needed moving to and from a bed to a chair (including a wheelchair)?: A Little Help needed standing up from a chair using your arms (e.g., wheelchair or bedside chair)?: A  Little Help needed to walk in hospital room?: A Little Help needed climbing 3-5 steps with a railing? : A Little 6 Click Score: 18    End of Session Equipment Utilized During Treatment: Oxygen;Gait belt Activity Tolerance: Patient tolerated treatment well Patient left: in chair;with call bell/phone within reach Nurse Communication: Mobility status (O2 sats only down to 88% on RA with activity) PT Visit Diagnosis: Difficulty in walking, not elsewhere classified (R26.2);Unsteadiness on feet (R26.81)     Time: 1000-1015 PT Time Calculation (min) (ACUTE ONLY): 15 min  Charges:  $Gait Training: 8-22 mins                    Blondell Reveal Kistler PT 11/27/2020  Acute Rehabilitation Services Pager [phone] Office [phone]

## 2020-11-28 DIAGNOSIS — U071 COVID-19: Secondary | ICD-10-CM | POA: Diagnosis not present

## 2020-11-28 DIAGNOSIS — J9601 Acute respiratory failure with hypoxia: Secondary | ICD-10-CM | POA: Diagnosis not present

## 2020-11-28 LAB — COMPREHENSIVE METABOLIC PANEL
ALT: 21 U/L (ref 0–44)
AST: 21 U/L (ref 15–41)
Albumin: 3.3 g/dL — ABNORMAL LOW (ref 3.5–5.0)
Alkaline Phosphatase: 56 U/L (ref 38–126)
Anion gap: 11 (ref 5–15)
BUN: 33 mg/dL — ABNORMAL HIGH (ref 8–23)
CO2: 26 mmol/L (ref 22–32)
Calcium: 8.1 mg/dL — ABNORMAL LOW (ref 8.9–10.3)
Chloride: 99 mmol/L (ref 98–111)
Creatinine, Ser: 0.97 mg/dL (ref 0.44–1.00)
GFR, Estimated: 56 mL/min — ABNORMAL LOW (ref >60)
Glucose, Bld: 179 mg/dL — ABNORMAL HIGH (ref 70–99)
Potassium: 4.3 mmol/L (ref 3.5–5.1)
Sodium: 136 mmol/L (ref 135–145)
Total Bilirubin: 0.8 mg/dL (ref 0.3–1.2)
Total Protein: 6.5 g/dL (ref 6.5–8.1)

## 2020-11-28 LAB — MAGNESIUM: Magnesium: 2.4 mg/dL (ref 1.7–2.4)

## 2020-11-28 LAB — CBC WITH DIFFERENTIAL/PLATELET
Abs Immature Granulocytes: 0.17 10*3/uL — ABNORMAL HIGH (ref 0.00–0.07)
Basophils Absolute: 0 10*3/uL (ref 0.0–0.1)
Basophils Relative: 0 %
Eosinophils Absolute: 0 10*3/uL (ref 0.0–0.5)
Eosinophils Relative: 0 %
HCT: 49.2 % — ABNORMAL HIGH (ref 36.0–46.0)
Hemoglobin: 16.2 g/dL — ABNORMAL HIGH (ref 12.0–15.0)
Immature Granulocytes: 2 %
Lymphocytes Relative: 9 %
Lymphs Abs: 0.8 10*3/uL (ref 0.7–4.0)
MCH: 27.8 pg (ref 26.0–34.0)
MCHC: 32.9 g/dL (ref 30.0–36.0)
MCV: 84.5 fL (ref 80.0–100.0)
Monocytes Absolute: 0.4 10*3/uL (ref 0.1–1.0)
Monocytes Relative: 4 %
Neutro Abs: 7.9 10*3/uL — ABNORMAL HIGH (ref 1.7–7.7)
Neutrophils Relative %: 85 %
Platelets: 393 10*3/uL (ref 150–400)
RBC: 5.82 MIL/uL — ABNORMAL HIGH (ref 3.87–5.11)
RDW: 12.7 % (ref 11.5–15.5)
WBC: 9.3 10*3/uL (ref 4.0–10.5)
nRBC: 0 % (ref 0.0–0.2)

## 2020-11-28 LAB — BRAIN NATRIURETIC PEPTIDE: B Natriuretic Peptide: 55.4 pg/mL (ref 0.0–100.0)

## 2020-11-28 LAB — D-DIMER, QUANTITATIVE: D-Dimer, Quant: 1.09 ug/mL-FEU — ABNORMAL HIGH (ref 0.00–0.50)

## 2020-11-28 LAB — FERRITIN: Ferritin: 242 ng/mL (ref 11–307)

## 2020-11-28 LAB — GLUCOSE, CAPILLARY
Glucose-Capillary: 159 mg/dL — ABNORMAL HIGH (ref 70–99)
Glucose-Capillary: 160 mg/dL — ABNORMAL HIGH (ref 70–99)
Glucose-Capillary: 161 mg/dL — ABNORMAL HIGH (ref 70–99)
Glucose-Capillary: 128 mg/dL — ABNORMAL HIGH (ref 70–99)

## 2020-11-28 LAB — C-REACTIVE PROTEIN: CRP: <0.5 mg/dL (ref <1.0)

## 2020-11-28 LAB — PHOSPHORUS: Phosphorus: 4.1 mg/dL (ref 2.5–4.6)

## 2020-11-28 LAB — PROCALCITONIN: Procalcitonin: <0.1 ng/mL

## 2020-11-28 MED ORDER — BENZONATATE 100 MG PO CAPS
200.0000 mg | ORAL_CAPSULE | Freq: Three times a day (TID) | ORAL | Status: DC
  Administered 2020-11-28 – 2020-12-04 (×19): 200 mg via ORAL
  Filled 2020-11-28 (×19): qty 2

## 2020-11-28 MED ORDER — METHYLPREDNISOLONE SODIUM SUCC 125 MG IJ SOLR
60.0000 mg | Freq: Two times a day (BID) | INTRAMUSCULAR | Status: DC
  Administered 2020-11-28 – 2020-11-29 (×3): 60 mg via INTRAVENOUS
  Filled 2020-11-28 (×3): qty 2

## 2020-11-28 NOTE — Progress Notes (Signed)
Pt. declined CPAP placement this shift, wants to remain on n/c, unit remains at bedside if wanting placed.

## 2020-11-28 NOTE — Progress Notes (Signed)
PROGRESS NOTE    Brooke Mora  ERX:[id number] DOB: 09-20-1929 DOA: 11/20/2020 PCP: Benito Mccreedy, MD   Chief Complaint  Patient presents with  . Hypoxic   Subjective: The patient was seen and examined this morning, complaining of generalized weaknesses, still on 4 L of oxygen, satting 87%.. Improved with some deep breathing and repositioning to 90% Denies any chest pain She has not been able to ambulate yet.     Brief Narrative:  Brooke Mora is a 87 y.o. female with medical history significant of COPD/emphysema, HTN, prior MAI off of treatment since early 2018 (saw [name]).  She's been admitted for covid pneumonia.  She has continued oxygen requirement.  Has required ~4 L past several days.  She'd initially looked like she might be ready for discharge late last week, but her O2 requirement has persistent and she appears weaker as well.  Continue steroids.  Completed remdesivir.  Holding off on actemra/baricitinib as she has hx of MAI and has only modest O2 requirement at this time.  Assessment & Plan:   Principal Problem:   Acute hypoxemic respiratory failure due to COVID-19 [facility]) Active Problems:   Cavitary lesion of lung   HTN (hypertension)  1. COVID-19 with new O2 requirement - 1. Still requiring requiring 4 L O2, wean as tolerated 2. Anticipating reevaluation with PT -- 3. Monitoring O2 sat closely 4. CT chest with extensive bilateral ground glass pulm infiltrates, mild emphysema, tumefactive scarring at lung apices bilaterally 5. CXR 1/31 viral pneumonia  6. Steroids (1/25-present), remdesivir (1/25-30).  Consider actemra/baricitinib (given hx of prior MAI, discussed with ID who recommended avoiding this if possible - if she declines or has increasing O2 requirement, would be worth considering this ). 7. Increasing steroid dose 8. Strict I/O, daily weights - net positive, trial lasix 9. Prone as able, OOB, IS, flutter, therapy  COVID-19 Labs  Recent Labs     11/26/20 0410 11/27/20 0404 11/28/20 0434  DDIMER 1.01* 0.98* 1.09*  FERRITIN 244 231 242  CRP 0.9 0.7 <0.5    Lab Results  Component Value Date   SARSCOV2NAA POSITIVE (A) 11/20/2020   2. Prediabetes  Steroid Induced Hyperglycemia 1. Follow A1c (6.3), SSI 2. Diabetic diet  3. Hx MAI 1. S/p treatment with ID, Dr. [name].  Following with pulm as well. 2. Consider discussion with ID if considering actemra/baricitinib (as above) 3. Increasing steroids dose  4. OSA 1. CPAP nightly 2. Stable  5. HTN - 1. Cont home metoprolol 2. Monitoring BP  Generalized weakness  Appreciate PT evaluation recommendation  DVT prophylaxis: lovenox Code Status: full  Family Communication: none at bedside - granddaughter 2/2/ Disposition:   Status is: Inpatient  Remains inpatient appropriate because:Inpatient level of care appropriate due to severity of illness   Dispo: The patient is from: Home              Anticipated d/c is to: Home              Anticipated d/c date is: > 3 days              Patient currently is not medically stable to d/c.   Difficult to place patient No       Consultants:   none  Procedures:  none  Antimicrobials:  Anti-infectives (From admission, onward)   Start     Dose/Rate Route Frequency Ordered Stop   11/22/20 1000  remdesivir 100 mg in sodium chloride 0.9 % 100 mL IVPB  100 mg 200 mL/hr over 30 Minutes Intravenous Daily 11/20/20 2320 11/25/20 0926   11/21/20 0000  remdesivir 100 mg in sodium chloride 0.9 % 100 mL IVPB        100 mg 200 mL/hr over 30 Minutes Intravenous Every 30 min 11/20/20 2320 11/21/20 0140           Interpreter used  Objective: Vitals:   11/27/20 2028 11/28/20 0354 11/28/20 0500 11/28/20 0755  BP: 132/68 128/76    Pulse: 61 (!) 56  67  Resp: (!) 21 18    Temp: 98.1 F (36.7 C) (!) 97.5 F (36.4 C)    TempSrc: Oral Oral    SpO2: (!) 89% 90%  (!) 87%  Weight:   81.7 kg   Height:       No  intake or output data in the 24 hours ending 11/28/20 1320 Filed Weights   11/24/20 0500 11/26/20 0445 11/28/20 0500  Weight: 89.2 kg 88.4 kg 81.7 kg        Physical Exam:   General:  Alert, oriented, cooperative, no distress;   HEENT:  Normocephalic, PERRL, otherwise with in Normal limits   Neuro:  CNII-XII intact. , normal motor and sensation, reflexes intact   Lungs:    Mild diffuse wheezing, with minimal rhonchi BL, Respirations unlabored, no wheezes / crackles  Cardio:    S1/S2, RRR, No murmure, No Rubs or Gallops   Abdomen:   Soft, non-tender, bowel sounds active all four quadrants,  no guarding or peritoneal signs.  Muscular skeletal:   Generalized weaknesses ---Limited exam - in bed, able to move all 4 extremities, Normal strength,  2+ pulses,  symmetric, No pitting edema  Skin:  Dry, warm to touch, negative for any Rashes,  Wounds: Please see nursing documentation         Data Reviewed: I have personally reviewed following labs and imaging studies  CBC: Recent Labs  Lab 11/24/20 0425 11/25/20 0413 11/26/20 0410 11/27/20 0404 11/28/20 0434  WBC 8.1 5.9 9.7 10.6* 9.3  NEUTROABS 5.9 4.7 8.1* 8.9* 7.9*  HGB 14.8 15.4* 15.2* 15.3* 16.2*  HCT 45.5 47.3* 44.9 47.5* 49.2*  MCV 86.2 85.8 83.8 86.4 84.5  PLT 247 227 232 303 527    Basic Metabolic Panel: Recent Labs  Lab 11/24/20 0425 11/25/20 0413 11/26/20 0410 11/27/20 0404 11/28/20 0434  NA 140 135 137 138 136  K 3.8 4.0 4.4 4.5 4.3  CL 105 100 100 101 99  CO2 25 23 25 26 26   GLUCOSE 120* 174* 171* 181* 179*  BUN 26* 24* 23 27* 33*  CREATININE 0.77 0.81 0.85 0.82 0.97  CALCIUM 8.2* 8.1* 8.1* 8.1* 8.1*  MG 2.3 2.4 2.3 2.5* 2.4  PHOS 2.6 3.6 3.4 3.7 4.1    GFR: Estimated Creatinine Clearance: 41.5 mL/min (by C-G formula based on SCr of 0.97 mg/dL).  Liver Function Tests: Recent Labs  Lab 11/24/20 0425 11/25/20 0413 11/26/20 0410 11/27/20 0404 11/28/20 0434  AST 28 22 23 19 21   ALT 23 23 17  20 21   ALKPHOS 55 58 55 55 56  BILITOT 0.6 0.6 1.0 0.6 0.8  PROT 6.4* 6.8 6.3* 6.3* 6.5  ALBUMIN 3.2* 3.4* 3.1* 3.2* 3.3*    CBG: Recent Labs  Lab 11/27/20 0735 11/27/20 1141 11/27/20 1651 11/28/20 0730 11/28/20 1140  GLUCAP 131* 116* 148* 159* 160*     Recent Results (from the past 240 hour(s))  SARS Coronavirus 2 by RT PCR (hospital order, performed in  [facility] hospital lab) Nasopharyngeal Nasopharyngeal Swab     Status: Abnormal   Collection Time: 11/20/20 10:00 PM   Specimen: Nasopharyngeal Swab  Result Value Ref Range Status   SARS Coronavirus 2 POSITIVE (A) NEGATIVE Final    Comment: RESULT CALLED TO, READ BACK BY AND VERIFIED WITH: KELLIE NEAL RN ON 11/20/20 AT 2305 [facility] (NOTE) SARS-CoV-2 target nucleic acids are DETECTED  SARS-CoV-2 RNA is generally detectable in upper respiratory specimens  during the acute phase of infection.  Positive results are indicative  of the presence of the identified virus, but do not rule out bacterial infection or co-infection with other pathogens not detected by the test.  Clinical correlation with patient history and  other diagnostic information is necessary to determine patient infection status.  The expected result is negative.  Fact Sheet for Patients:   StrictlyIdeas.no   Fact Sheet for Healthcare Providers:   BankingDealers.co.za    This test is not yet approved or cleared by the [place] FDA and  has been authorized for detection and/or diagnosis of SARS-CoV-2 by FDA under an Emergency Use Authorization (EUA).  This EUA will remain in effect (meaning this  test can be used) for the duration of  the COVID-19 declaration under Section 564(b)(1) of the Act, 21 U.S.C. section 360-bbb-3(b)(1), unless the authorization is terminated or revoked sooner.  Performed at [facility], [address] [place], [place] [address]   Blood Culture (routine x 2)      Status: None   Collection Time: 11/20/20 11:00 PM   Specimen: BLOOD  Result Value Ref Range Status   Specimen Description   Final    BLOOD LEFT ANTECUBITAL Performed at [facility], [address] [place], [place] [address]    Special Requests   Final    BOTTLES DRAWN AEROBIC AND ANAEROBIC Blood Culture adequate volume Performed at [facility], [address] [place], [place] [address]    Culture   Final    NO GROWTH 5 DAYS Performed at [facility], [place] [address], [place], [place] [address]    Report Status 11/26/2020 FINAL  Final  Blood Culture (routine x 2)     Status: None   Collection Time: 11/20/20 11:40 PM   Specimen: BLOOD RIGHT HAND  Result Value Ref Range Status   Specimen Description BLOOD RIGHT HAND  Final   Special Requests   Final    BOTTLES DRAWN AEROBIC AND ANAEROBIC Blood Culture adequate volume   Culture   Final    NO GROWTH 5 DAYS Performed at [facility], [place] [address] [place], [place] [address]    Report Status 11/26/2020 FINAL  Final         Radiology Studies: DG CHEST PORT 1 VIEW  Result Date: 11/27/2020 CLINICAL DATA:  Shortness of breath, COVID-19 positive. EXAM: PORTABLE CHEST 1 VIEW COMPARISON:  November 26, 2020. FINDINGS: The heart size and mediastinal contours are within normal limits. No pneumothorax or pleural effusion is noted. Bilateral upper lobe lung opacities are noted, left greater than right, consistent with multifocal pneumonia. The visualized skeletal structures are unremarkable. IMPRESSION: Bilateral upper lobe pneumonia, left greater than right. Electronically Signed   By: Marijo Conception M.D.   On: 11/27/2020 15:34        Scheduled Meds: . benzonatate  200 mg Oral TID  . enoxaparin (LOVENOX) injection  40 mg Subcutaneous Q24H  . insulin aspart  0-9 Units Subcutaneous TID WC  . methylPREDNISolone (SOLU-MEDROL) injection  60 mg Intravenous BID  . metoprolol succinate  50 mg Oral Daily    Continuous Infusions: . sodium chloride Stopped (11/22/20 1309)     LOS: 7 days    Time spent: over 47 min    Deatra James, MD [facility]   To contact the attending provider between 7A-7P or the covering provider during after hours 7P-7A, please log into the web site [email or web address] and access using universal [place] password for that web site. If you do not have the password, please call the hospital operator.  11/28/2020, 1:20 PM

## 2020-11-28 NOTE — Progress Notes (Signed)
Physical Therapy Treatment Patient Details Name: Brooke Mora MRN: [id number] DOB: 1929/10/03 Today's Date: 11/28/2020    History of Present Illness Pt is a 87 y.o. 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  She has been admitted with COVID 19 with new O2 needs,    PT Comments    Pt ambulated 100' without an assistive device, SaO2 83% on 8L O2 while walking, 87% on 4L at rest. 2/4 dyspnea with ambulation. Increasing O2 flow from 4L yesterday to 8L today with ambulation did not improve SaO2 levels. Pt demonstrates good pursed lip breathing technique. Performed BUE/LE strengthening exercises.   Follow Up Recommendations  No PT follow up     Equipment Recommendations  Rolling walker with 5" wheels    Recommendations for Other Services       Precautions / Restrictions Precautions Precautions: Other (comment) Precaution Comments: monitor O2 Restrictions Weight Bearing Restrictions: No    Mobility  Bed Mobility               General bed mobility comments: in chair and returned to chair  Transfers Overall transfer level: Modified independent Equipment used: None Transfers: Sit to/from Stand Sit to Stand: Modified independent (Device/Increase time)         General transfer comment: no LOB, used armrests of recliner  Ambulation/Gait Ambulation/Gait assistance: Supervision Gait Distance (Feet): 100 Feet Assistive device: None Gait Pattern/deviations: Step-through pattern;Decreased stride length Gait velocity: decreased   General Gait Details: no loss of balance, SaO2 87% on 4L at rest, 83% walking with 8L O2 [place], 2/4 dyspena. It took ~7 minutes for SaO2 to recover to baseline SaO2 levels with seated rest after walking. Pt demonstates good pursed lip breathing technique   Stairs             Wheelchair Mobility    Modified Rankin (Stroke Patients Only)       Balance Overall balance assessment: Needs  assistance Sitting-balance support: No upper extremity supported Sitting balance-Leahy Scale: Normal     Standing balance support: No upper extremity supported Standing balance-Leahy Scale: Good Standing balance comment: Steady with gait and static; did have LOB with turns                            Cognition Arousal/Alertness: Awake/alert Behavior During Therapy: WFL for tasks assessed/performed Overall Cognitive Status: Within Functional Limits for tasks assessed                                        Exercises General Exercises - Upper Extremity Shoulder Flexion: AROM;Both;10 reps;Seated General Exercises - Lower Extremity Ankle Circles/Pumps: AROM;Both;Seated;15 reps Long Arc Quad: AROM;Both;Seated;15 reps Hip Flexion/Marching: AROM;Both;Seated;15 reps    General Comments        Pertinent Vitals/Pain Pain Assessment: No/denies pain    Home Living                      Prior Function            PT Goals (current goals can now be found in the care plan section) Acute Rehab PT Goals Patient Stated Goal: return home Time For Goal Achievement: 12/06/20 Potential to Achieve Goals: Good Additional Goals Additional Goal #1: Will score 19 or > on DGI to indicate low fall risk. Progress towards PT goals:  Progressing toward goals    Frequency    Min 3X/week      PT Plan Current plan remains appropriate    Co-evaluation              AM-PAC PT "6 Clicks" Mobility   Outcome Measure  Help needed turning from your back to your side while in a flat bed without using bedrails?: A Little Help needed moving from lying on your back to sitting on the side of a flat bed without using bedrails?: A Little Help needed moving to and from a bed to a chair (including a wheelchair)?: A Little Help needed standing up from a chair using your arms (e.g., wheelchair or bedside chair)?: A Little Help needed to walk in hospital room?: A  Little Help needed climbing 3-5 steps with a railing? : A Little 6 Click Score: 18    End of Session Equipment Utilized During Treatment: Oxygen;Gait belt Activity Tolerance: Patient tolerated treatment well Patient left: in chair;with call bell/phone within reach Nurse Communication: Mobility status (O2 sats only down to 88% on RA with activity) PT Visit Diagnosis: Difficulty in walking, not elsewhere classified (R26.2);Unsteadiness on feet (R26.81)     Time: 8138-8719 PT Time Calculation (min) (ACUTE ONLY): 13 min  Charges:              Philomena Doheny PT 11/28/2020  Acute Rehabilitation Services Pager [phone] Office [phone]

## 2020-11-29 ENCOUNTER — Inpatient Hospital Stay (HOSPITAL_COMMUNITY): Payer: Medicaid Other

## 2020-11-29 DIAGNOSIS — J9601 Acute respiratory failure with hypoxia: Secondary | ICD-10-CM | POA: Diagnosis not present

## 2020-11-29 DIAGNOSIS — U071 COVID-19: Secondary | ICD-10-CM | POA: Diagnosis not present

## 2020-11-29 DIAGNOSIS — R7989 Other specified abnormal findings of blood chemistry: Secondary | ICD-10-CM

## 2020-11-29 LAB — GLUCOSE, CAPILLARY
Glucose-Capillary: 114 mg/dL — ABNORMAL HIGH (ref 70–99)
Glucose-Capillary: 134 mg/dL — ABNORMAL HIGH (ref 70–99)
Glucose-Capillary: 163 mg/dL — ABNORMAL HIGH (ref 70–99)
Glucose-Capillary: 168 mg/dL — ABNORMAL HIGH (ref 70–99)

## 2020-11-29 LAB — PROCALCITONIN: Procalcitonin: <0.1 ng/mL

## 2020-11-29 LAB — C-REACTIVE PROTEIN: CRP: <0.5 mg/dL (ref <1.0)

## 2020-11-29 NOTE — Progress Notes (Signed)
Bilateral lower extremity venous duplex has been completed. Preliminary results can be found in CV Proc through chart review.   11/29/20 2:00 PM Brooke Mora RVT

## 2020-11-29 NOTE — Progress Notes (Signed)
PROGRESS NOTE    Brooke Mora  ZOX:[id number] DOB: [birth year] DOA: 11/20/2020 PCP: Benito Mccreedy, MD   Chief Complaint  Patient presents with  . Hypoxic   Subjective: The patient was seen and examined this morning, awake alert oriented, still on 4 L of oxygen, satting in low 90s. No issues overnight No complaint of chest pain Persistent cough, especially with exertion    Brief Narrative:  Brooke Mora is a 87 y.o. female with medical history significant of COPD/emphysema, HTN, prior MAI off of treatment since early 2018 (saw [name]).  She's been admitted for covid pneumonia.  She has continued oxygen requirement.  Has required ~4 L past several days.  She'd initially looked like she might be ready for discharge late last week, but her O2 requirement has persistent and she appears weaker as well.  Continue steroids.  Completed remdesivir.  Holding off on actemra/baricitinib as she has hx of MAI and has only modest O2 requirement at this time.  Assessment & Plan:   Principal Problem:   Acute hypoxemic respiratory failure due to COVID-19 [facility]) Active Problems:   Cavitary lesion of lung   HTN (hypertension)  1. COVID-19 with new O2 requirement - 1. Still requiring 4 L of oxygen, satting 88-90% 2. Anticipating reevaluation with PT -- 3. Minimal improvement with supplemental oxygen... Encourage incentive spirometer use 4. CT chest with extensive bilateral ground glass pulm infiltrates, mild emphysema, tumefactive scarring at lung apices bilaterally  5. CTA reviewed once again no signs of pulmonary embolism 6. CXR 1/31 viral pneumonia  7. Steroids (1/25-present), remdesivir (1/25-30).  Consider actemra/baricitinib (given hx of prior MAI, discussed with ID who recommended avoiding this if possible - if she declines or has increasing O2 requirement, would be worth considering this ). 8. Increasing steroid dose ... Inhalers, respiratory use of CPAP 9. Strict I/O, daily weights  - net positive, trial lasix 10. Prone as able, OOB, IS, flutter, therapy  COVID-19 Labs  Recent Labs    11/27/20 0404 11/28/20 0434 11/29/20 0337  DDIMER 0.98* 1.09*  --   FERRITIN 231 242  --   CRP 0.7 <0.5 <0.5   Elevated D-dimer, CTA of the chest was once again reviewed, no signs of pulmonary embolism, obtaining a bilateral lower extremity Doppler study to rule out DVT --we will holding current therapeutic dose of anticoagulation    Lab Results  Component Value Date   SARSCOV2NAA POSITIVE (A) 11/20/2020   2. Prediabetes  Steroid Induced Hyperglycemia 1. Follow A1c (6.3), SSI 2. Diabetic diet 3. Monitoring closely  3. Hx MAI 1. S/p treatment with ID, Dr. [name].  Following with pulm as well. 2. Consider discussion with ID if considering actemra/baricitinib (as above) 3. Increasing steroids dose--- no changes monitoring  4. OSA 1. CPAP nightly--- respiratory evaluate, increased use of CPAP due to respiratory distress secondary to Covid 2. Stable  5. HTN - 1. Cont home metoprolol 2. Monitoring BP  6.   Generalized weakness  Appreciate PT evaluation recommendation  DVT prophylaxis: lovenox Code Status: full  Family Communication: none at bedside - granddaughter 2/2/ Disposition:   Status is: Inpatient  Remains inpatient appropriate because:Inpatient level of care appropriate due to severity of illness   Dispo: The patient is from: Home              Anticipated d/c is to: Home              Anticipated d/c date is: > 3 days  Patient currently is not medically stable to d/c.   Difficult to place patient No       Consultants:   none  Procedures:  none  Antimicrobials:  Anti-infectives (From admission, onward)   Start     Dose/Rate Route Frequency Ordered Stop   11/22/20 1000  remdesivir 100 mg in sodium chloride 0.9 % 100 mL IVPB        100 mg 200 mL/hr over 30 Minutes Intravenous Daily 11/20/20 2320 11/25/20 0926   11/21/20 0000   remdesivir 100 mg in sodium chloride 0.9 % 100 mL IVPB        100 mg 200 mL/hr over 30 Minutes Intravenous Every 30 min 11/20/20 2320 11/21/20 0140           Interpreter used  Objective: Vitals:   11/28/20 1424 11/28/20 2012 11/29/20 0325 11/29/20 0500  BP: (!) 149/89 122/72 128/72   Pulse: 66 63 64   Resp: 16 18 15    Temp: 97.9 F (36.6 C) (!) 97.3 F (36.3 C) (!) 97.5 F (36.4 C)   TempSrc: Oral Oral Oral   SpO2: 93% (!) 89% (!) 88%   Weight:    81.1 kg  Height:       No intake or output data in the 24 hours ending 11/29/20 1156 Filed Weights   11/26/20 0445 11/28/20 0500 11/29/20 0500  Weight: 88.4 kg 81.7 kg 81.1 kg         Physical Exam:   General:  Alert, oriented, cooperative, no distress; on 4 L supplemental oxygen  HEENT:  Normocephalic, PERRL, otherwise with in Normal limits   Neuro:  CNII-XII intact. , normal motor and sensation, reflexes intact   Lungs:    Diffuse rhonchi, Rales,  no wheezes / crackles  Cardio:    S1/S2, RRR, No murmure, No Rubs or Gallops   Abdomen:   Soft, non-tender, bowel sounds active all four quadrants,  no guarding or peritoneal signs.  Muscular skeletal:  Limited exam - in bed, able to move all 4 extremities, Normal strength,  2+ pulses,  symmetric, No pitting edema  Skin:  Dry, warm to touch, negative for any Rashes,  Wounds: Please see nursing documentation            Data Reviewed: I have personally reviewed following labs and imaging studies  CBC: Recent Labs  Lab 11/24/20 0425 11/25/20 0413 11/26/20 0410 11/27/20 0404 11/28/20 0434  WBC 8.1 5.9 9.7 10.6* 9.3  NEUTROABS 5.9 4.7 8.1* 8.9* 7.9*  HGB 14.8 15.4* 15.2* 15.3* 16.2*  HCT 45.5 47.3* 44.9 47.5* 49.2*  MCV 86.2 85.8 83.8 86.4 84.5  PLT 247 227 232 303 716    Basic Metabolic Panel: Recent Labs  Lab 11/24/20 0425 11/25/20 0413 11/26/20 0410 11/27/20 0404 11/28/20 0434  NA 140 135 137 138 136  K 3.8 4.0 4.4 4.5 4.3  CL 105 100 100 101 99   CO2 25 23 25 26 26   GLUCOSE 120* 174* 171* 181* 179*  BUN 26* 24* 23 27* 33*  CREATININE 0.77 0.81 0.85 0.82 0.97  CALCIUM 8.2* 8.1* 8.1* 8.1* 8.1*  MG 2.3 2.4 2.3 2.5* 2.4  PHOS 2.6 3.6 3.4 3.7 4.1    GFR: Estimated Creatinine Clearance: 41.3 mL/min (by C-G formula based on SCr of 0.97 mg/dL).  Liver Function Tests: Recent Labs  Lab 11/24/20 0425 11/25/20 0413 11/26/20 0410 11/27/20 0404 11/28/20 0434  AST 28 22 23 19 21   ALT 23 23 17 20  21  ALKPHOS 55 58 55 55 56  BILITOT 0.6 0.6 1.0 0.6 0.8  PROT 6.4* 6.8 6.3* 6.3* 6.5  ALBUMIN 3.2* 3.4* 3.1* 3.2* 3.3*    CBG: Recent Labs  Lab 11/28/20 1140 11/28/20 1630 11/28/20 2101 11/29/20 0732 11/29/20 1141  GLUCAP 160* 161* 128* 114* 134*     Recent Results (from the past 240 hour(s))  SARS Coronavirus 2 by RT PCR (hospital order, performed in [facility] hospital lab) Nasopharyngeal Nasopharyngeal Swab     Status: Abnormal   Collection Time: 11/20/20 10:00 PM   Specimen: Nasopharyngeal Swab  Result Value Ref Range Status   SARS Coronavirus 2 POSITIVE (A) NEGATIVE Final    Comment: RESULT CALLED TO, READ BACK BY AND VERIFIED WITH: KELLIE NEAL RN ON 11/20/20 AT 2305 [facility] (NOTE) SARS-CoV-2 target nucleic acids are DETECTED  SARS-CoV-2 RNA is generally detectable in upper respiratory specimens  during the acute phase of infection.  Positive results are indicative  of the presence of the identified virus, but do not rule out bacterial infection or co-infection with other pathogens not detected by the test.  Clinical correlation with patient history and  other diagnostic information is necessary to determine patient infection status.  The expected result is negative.  Fact Sheet for Patients:   StrictlyIdeas.no   Fact Sheet for Healthcare Providers:   BankingDealers.co.za    This test is not yet approved or cleared by the [place] FDA and  has been authorized for  detection and/or diagnosis of SARS-CoV-2 by FDA under an Emergency Use Authorization (EUA).  This EUA will remain in effect (meaning this  test can be used) for the duration of  the COVID-19 declaration under Section 564(b)(1) of the Act, 21 U.S.C. section 360-bbb-3(b)(1), unless the authorization is terminated or revoked sooner.  Performed at [facility], [address] [place], [place] [address]   Blood Culture (routine x 2)     Status: None   Collection Time: 11/20/20 11:00 PM   Specimen: BLOOD  Result Value Ref Range Status   Specimen Description   Final    BLOOD LEFT ANTECUBITAL Performed at [facility], [address] [place], [place] [address]    Special Requests   Final    BOTTLES DRAWN AEROBIC AND ANAEROBIC Blood Culture adequate volume Performed at [facility], [address] [place], [place] [address]    Culture   Final    NO GROWTH 5 DAYS Performed at [facility], [place] [address], [place], [place] [address]    Report Status 11/26/2020 FINAL  Final  Blood Culture (routine x 2)     Status: None   Collection Time: 11/20/20 11:40 PM   Specimen: BLOOD RIGHT HAND  Result Value Ref Range Status   Specimen Description BLOOD RIGHT HAND  Final   Special Requests   Final    BOTTLES DRAWN AEROBIC AND ANAEROBIC Blood Culture adequate volume   Culture   Final    NO GROWTH 5 DAYS Performed at [facility], [place] [address], [place], [place] [address]    Report Status 11/26/2020 FINAL  Final         Radiology Studies: DG CHEST PORT 1 VIEW  Result Date: 11/27/2020 CLINICAL DATA:  Shortness of breath, COVID-19 positive. EXAM: PORTABLE CHEST 1 VIEW COMPARISON:  November 26, 2020. FINDINGS: The heart size and mediastinal contours are within normal limits. No pneumothorax or pleural effusion is noted. Bilateral upper  lobe lung opacities are noted, left greater than right, consistent with multifocal pneumonia. The visualized  skeletal structures are unremarkable. IMPRESSION: Bilateral upper lobe pneumonia, left greater than right. Electronically Signed   By: Marijo Conception M.D.   On: 11/27/2020 15:34        Scheduled Meds: . benzonatate  200 mg Oral TID  . enoxaparin (LOVENOX) injection  40 mg Subcutaneous Q24H  . insulin aspart  0-9 Units Subcutaneous TID WC  . methylPREDNISolone (SOLU-MEDROL) injection  60 mg Intravenous BID  . metoprolol succinate  50 mg Oral Daily   Continuous Infusions: . sodium chloride Stopped (11/22/20 1309)     LOS: 8 days    Time spent: over 31 min    Deatra James, MD [facility]   To contact the attending provider between 7A-7P or the covering provider during after hours 7P-7A, please log into the web site [email or web address] and access using universal [place] password for that web site. If you do not have the password, please call the hospital operator.  11/29/2020, 11:56 AM

## 2020-11-29 NOTE — Progress Notes (Signed)
Physical Therapy Treatment Patient Details Name: Brooke Mora MRN: [id number] DOB: 17-Dec-1928 Today's Date: 11/29/2020    History of Present Illness Pt is a 87 y.o. 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  She has been admitted with COVID 19 with new O2 needs,    PT Comments    Pt ambulated 65' with close supervision/min guard assist for mild unsteadiness but not overt loss of balance. SaO2 84% on 8L O2 walking, 87% on 4L O2 at rest. Performed seated BUE/LE exercises, issued green theraband for resisted UE exercises.  Pt tolerated less ambulation distance today, no significant change in O2 requirements. Pt does well with pursed lip breathing during activity.    Follow Up Recommendations  Home health PT     Equipment Recommendations  Other (comment) (rollator)    Recommendations for Other Services       Precautions / Restrictions Precautions Precautions: Other (comment) Precaution Comments: monitor O2 Restrictions Weight Bearing Restrictions: No    Mobility  Bed Mobility               General bed mobility comments: in chair and returned to chair  Transfers Overall transfer level: Needs assistance   Transfers: Sit to/from Stand Sit to Stand: Supervision         General transfer comment: mildly unsteady initially upon standing  Ambulation/Gait Ambulation/Gait assistance: Min guard Gait Distance (Feet): 70 Feet Assistive device: None Gait Pattern/deviations: Step-through pattern;Decreased stride length Gait velocity: decreased   General Gait Details: no loss of balance, SaO2 87% at rest on 4L O2, 84% on 8L O2 while ambulating   Stairs             Wheelchair Mobility    Modified Rankin (Stroke Patients Only)       Balance Overall balance assessment: Needs assistance Sitting-balance support: No upper extremity supported Sitting balance-Leahy Scale: Normal     Standing balance support: No upper  extremity supported Standing balance-Leahy Scale: Good Standing balance comment: Steady with gait and static                            Cognition Arousal/Alertness: Awake/alert Behavior During Therapy: WFL for tasks assessed/performed Overall Cognitive Status: Within Functional Limits for tasks assessed                                        Exercises General Exercises - Upper Extremity Shoulder Flexion: AROM;Both;Seated;Theraband;5 reps Theraband Level (Shoulder Flexion): Level 3 (Green) Shoulder Horizontal ABduction: AROM;Both;5 reps;Theraband;Seated Theraband Level (Shoulder Horizontal Abduction): Level 3 (Green) General Exercises - Lower Extremity Long Arc Quad: AROM;Both;Seated;15 reps Hip Flexion/Marching: AROM;Both;Seated;15 reps    General Comments        Pertinent Vitals/Pain Pain Assessment: Faces Faces Pain Scale: Hurts little more Pain Location: throat Pain Descriptors / Indicators: Sore Pain Intervention(s): Limited activity within patient's tolerance;Monitored during session;Patient requesting pain meds-RN notified    Home Living                      Prior Function            PT Goals (current goals can now be found in the care plan section) Acute Rehab PT Goals Patient Stated Goal: return home Time For Goal Achievement: 12/06/20 Potential to Achieve Goals: Good Additional Goals Additional  Goal #1: Will score 19 or > on DGI to indicate low fall risk. Progress towards PT goals: Progressing toward goals    Frequency    Min 3X/week      PT Plan Discharge plan needs to be updated    Co-evaluation              AM-PAC PT "6 Clicks" Mobility   Outcome Measure  Help needed turning from your back to your side while in a flat bed without using bedrails?: A Little Help needed moving from lying on your back to sitting on the side of a flat bed without using bedrails?: A Little Help needed moving to and from a  bed to a chair (including a wheelchair)?: A Little Help needed standing up from a chair using your arms (e.g., wheelchair or bedside chair)?: A Little Help needed to walk in hospital room?: A Little Help needed climbing 3-5 steps with a railing? : A Little 6 Click Score: 18    End of Session Equipment Utilized During Treatment: Oxygen;Gait belt Activity Tolerance: Patient tolerated treatment well;Patient limited by fatigue Patient left: in chair;with call bell/phone within reach Nurse Communication: Mobility status (O2 sats only down to 88% on RA with activity) PT Visit Diagnosis: Difficulty in walking, not elsewhere classified (R26.2)     Time: 1219-7588 PT Time Calculation (min) (ACUTE ONLY): 13 min  Charges:  $Gait Training: 8-22 mins                    Blondell Reveal Kistler PT 11/29/2020  Acute Rehabilitation Services Pager [phone] Office [phone]

## 2020-11-30 DIAGNOSIS — J9601 Acute respiratory failure with hypoxia: Secondary | ICD-10-CM | POA: Diagnosis not present

## 2020-11-30 DIAGNOSIS — U071 COVID-19: Secondary | ICD-10-CM | POA: Diagnosis not present

## 2020-11-30 LAB — BASIC METABOLIC PANEL
Anion gap: 11 (ref 5–15)
BUN: 37 mg/dL — ABNORMAL HIGH (ref 8–23)
CO2: 27 mmol/L (ref 22–32)
Calcium: 7.8 mg/dL — ABNORMAL LOW (ref 8.9–10.3)
Chloride: 100 mmol/L (ref 98–111)
Creatinine, Ser: 0.9 mg/dL (ref 0.44–1.00)
GFR, Estimated: >60 mL/min (ref >60)
Glucose, Bld: 183 mg/dL — ABNORMAL HIGH (ref 70–99)
Potassium: 4.5 mmol/L (ref 3.5–5.1)
Sodium: 138 mmol/L (ref 135–145)

## 2020-11-30 LAB — CBC
HCT: 45.6 % (ref 36.0–46.0)
Hemoglobin: 14.6 g/dL (ref 12.0–15.0)
MCH: 27.9 pg (ref 26.0–34.0)
MCHC: 32 g/dL (ref 30.0–36.0)
MCV: 87.2 fL (ref 80.0–100.0)
Platelets: 328 10*3/uL (ref 150–400)
RBC: 5.23 MIL/uL — ABNORMAL HIGH (ref 3.87–5.11)
RDW: 12.9 % (ref 11.5–15.5)
WBC: 7.1 10*3/uL (ref 4.0–10.5)
nRBC: 0 % (ref 0.0–0.2)

## 2020-11-30 LAB — D-DIMER, QUANTITATIVE: D-Dimer, Quant: 0.98 ug/mL-FEU — ABNORMAL HIGH (ref 0.00–0.50)

## 2020-11-30 LAB — GLUCOSE, CAPILLARY
Glucose-Capillary: 144 mg/dL — ABNORMAL HIGH (ref 70–99)
Glucose-Capillary: 128 mg/dL — ABNORMAL HIGH (ref 70–99)
Glucose-Capillary: 166 mg/dL — ABNORMAL HIGH (ref 70–99)
Glucose-Capillary: 134 mg/dL — ABNORMAL HIGH (ref 70–99)

## 2020-11-30 MED ORDER — SODIUM CHLORIDE 0.9% FLUSH
10.0000 mL | INTRAVENOUS | Status: DC | PRN
  Administered 2020-12-04: 10 mL

## 2020-11-30 MED ORDER — METHYLPREDNISOLONE SODIUM SUCC 40 MG IJ SOLR
40.0000 mg | Freq: Two times a day (BID) | INTRAMUSCULAR | Status: DC
  Administered 2020-11-30 – 2020-12-01 (×3): 40 mg via INTRAVENOUS
  Filled 2020-11-30 (×3): qty 1

## 2020-11-30 MED ORDER — SODIUM CHLORIDE 0.9% FLUSH
10.0000 mL | Freq: Two times a day (BID) | INTRAVENOUS | Status: DC
  Administered 2020-12-01 – 2020-12-04 (×8): 10 mL

## 2020-11-30 NOTE — Progress Notes (Signed)
PROGRESS NOTE    Brooke Mora  WTU:[id number] DOB: 08-04-1929 DOA: 11/20/2020 PCP: Benito Mccreedy, MD   Chief Complaint  Patient presents with  . Hypoxic   Subjective: The patient was seen and examined this morning, awake alert oriented following commands Reporting improved cough shortness of breath Remains on 4 L but O2 sat improved..    Brief Narrative:  Brooke Mora is a 87 y.o. female with medical history significant of COPD/emphysema, HTN, prior MAI off of treatment since early 2018 (saw [name]).  She's been admitted for covid pneumonia.  She has continued oxygen requirement.  Has required ~4 L past several days.  She'd initially looked like she might be ready for discharge late last week, but her O2 requirement has persistent and she appears weaker as well.  Continue steroids.  Completed remdesivir.  Holding off on actemra/baricitinib as she has hx of MAI and has only modest O2 requirement at this time.  Assessment & Plan:   Principal Problem:   Acute hypoxemic respiratory failure due to COVID-19 [facility]) Active Problems:   Cavitary lesion of lung   HTN (hypertension)  1. COVID-19 with new O2 requirement - 1. Remains on 4 L of oxygen, but O2 sat improved to greater 94% 2. Anticipating reevaluation with PT -- 3. Minimal improvement with supplemental oxygen... Encourage incentive spirometer use 4. CT chest with extensive bilateral ground glass pulm infiltrates, mild emphysema, tumefactive scarring at lung apices bilaterally  5. CTA reviewed once again no signs of pulmonary embolism 6. CXR 1/31 viral pneumonia  7. Steroids (1/25-present --- tapering down 8. Remdesivir (1/25-30).   9. Consider actemra/baricitinib (given hx of prior MAI, discussed with ID who recommended avoiding this if possible - if she declines or has increasing O2 requirement, would be worth considering this ). 10. Increasing steroid dose ... Inhalers, respiratory use of CPAP 11. Strict I/O, daily  weights - net positive, trial lasix 12. Prone as able, OOB, IS, flutter, therapy  COVID-19 Labs  Recent Labs    11/28/20 0434 11/29/20 0337 11/30/20 0358  DDIMER 1.09*  --  0.98*  FERRITIN 242  --   --   CRP <0.5 <0.5  --    Elevated D-dimer, CTA of the chest was once again reviewed, no signs of pulmonary embolism, obtaining a bilateral lower extremity Doppler study to rule out DVT --we will holding current therapeutic dose of anticoagulation    Lab Results  Component Value Date   SARSCOV2NAA POSITIVE (A) 11/20/2020   2. Prediabetes  Steroid Induced Hyperglycemia 1. Follow A1c (6.3), SSI 2. Diabetic diet 3. Monitoring, stable  3. Hx MAI 1. S/p treatment with ID, Dr. [name].  Following with pulm as well. 2. Consider discussion with ID if considering actemra/baricitinib (as above) 3. Increasing steroids dose--- no changes, remained stable  4. OSA 1. CPAP nightly--- respiratory evaluate, increased use of CPAP due to respiratory distress secondary to Covid 2. Stable   5. HTN - 1. Cont home metoprolol 2. Monitoring BP  6.   Generalized weakness  Appreciate PT evaluation recommendation  DVT prophylaxis: lovenox Code Status: full  Family Communication: none at bedside - granddaughter 2/2/ Disposition:   Status is: Inpatient  Remains inpatient appropriate because:Inpatient level of care appropriate due to severity of illness   Dispo: The patient is from: Home              Anticipated d/c is to: Home  Anticipated d/c date is: in 1-2 days, as soon as her O2 sat improved to likely room  air-or minimal O2 demand (she was not O2 dependent prior to this admission)              Patient currently is not medically stable to d/c.   Difficult to place patient No       Consultants:   none  Procedures:  none  Antimicrobials:  Anti-infectives (From admission, onward)   Start     Dose/Rate Route Frequency Ordered Stop   11/22/20 1000  remdesivir 100  mg in sodium chloride 0.9 % 100 mL IVPB        100 mg 200 mL/hr over 30 Minutes Intravenous Daily 11/20/20 2320 11/25/20 0926   11/21/20 0000  remdesivir 100 mg in sodium chloride 0.9 % 100 mL IVPB        100 mg 200 mL/hr over 30 Minutes Intravenous Every 30 min 11/20/20 2320 11/21/20 0140           Interpreter used  Objective: Vitals:   11/29/20 1435 11/29/20 2244 11/30/20 0422 11/30/20 0500  BP: 117/66 136/71 (!) 135/59   Pulse: 63 63 65   Resp: 16 20 20    Temp: 97.7 F (36.5 C) 98.1 F (36.7 C) 97.9 F (36.6 C)   TempSrc: Oral Oral    SpO2: 93% 94% 92%   Weight:    82.1 kg  Height:       No intake or output data in the 24 hours ending 11/30/20 1107 Filed Weights   11/28/20 0500 11/29/20 0500 11/30/20 0500  Weight: 81.7 kg 81.1 kg 82.1 kg      Physical Exam:   General:  Alert, oriented, cooperative, no distress;   HEENT:  Normocephalic, PERRL, otherwise with in Normal limits   Neuro:  CNII-XII intact. , normal motor and sensation, reflexes intact   Lungs:    Improving diffuse rhonchi and rales, Respirations unlabored, no wheezes / crackles  Cardio:    S1/S2, RRR, No murmure, No Rubs or Gallops   Abdomen:   Soft, non-tender, bowel sounds active all four quadrants,  no guarding or peritoneal signs.  Muscular skeletal:  Limited exam - in bed, able to move all 4 extremities, Normal strength,  2+ pulses,  symmetric, No pitting edema  Skin:  Dry, warm to touch, negative for any Rashes,  Wounds: Please see nursing documentation               Data Reviewed: I have personally reviewed following labs and imaging studies  CBC: Recent Labs  Lab 11/24/20 0425 11/25/20 0413 11/26/20 0410 11/27/20 0404 11/28/20 0434 11/30/20 0358  WBC 8.1 5.9 9.7 10.6* 9.3 7.1  NEUTROABS 5.9 4.7 8.1* 8.9* 7.9*  --   HGB 14.8 15.4* 15.2* 15.3* 16.2* 14.6  HCT 45.5 47.3* 44.9 47.5* 49.2* 45.6  MCV 86.2 85.8 83.8 86.4 84.5 87.2  PLT 247 227 232 303 393 328    Basic  Metabolic Panel: Recent Labs  Lab 11/24/20 0425 11/25/20 0413 11/26/20 0410 11/27/20 0404 11/28/20 0434 11/30/20 0358  NA 140 135 137 138 136 138  K 3.8 4.0 4.4 4.5 4.3 4.5  CL 105 100 100 101 99 100  CO2 25 23 25 26 26 27   GLUCOSE 120* 174* 171* 181* 179* 183*  BUN 26* 24* 23 27* 33* 37*  CREATININE 0.77 0.81 0.85 0.82 0.97 0.90  CALCIUM 8.2* 8.1* 8.1* 8.1* 8.1* 7.8*  MG 2.3 2.4 2.3 2.5*  2.4  --   PHOS 2.6 3.6 3.4 3.7 4.1  --     GFR: Estimated Creatinine Clearance: 44.8 mL/min (by C-G formula based on SCr of 0.9 mg/dL).  Liver Function Tests: Recent Labs  Lab 11/24/20 0425 11/25/20 0413 11/26/20 0410 11/27/20 0404 11/28/20 0434  AST 28 22 23 19 21   ALT 23 23 17 20 21   ALKPHOS 55 58 55 55 56  BILITOT 0.6 0.6 1.0 0.6 0.8  PROT 6.4* 6.8 6.3* 6.3* 6.5  ALBUMIN 3.2* 3.4* 3.1* 3.2* 3.3*    CBG: Recent Labs  Lab 11/29/20 0732 11/29/20 1141 11/29/20 1619 11/29/20 2246 11/30/20 0727  GLUCAP 114* 134* 163* 168* 144*     Recent Results (from the past 240 hour(s))  SARS Coronavirus 2 by RT PCR (hospital order, performed in [place] hospital lab) Nasopharyngeal Nasopharyngeal Swab     Status: Abnormal   Collection Time: 11/20/20 10:00 PM   Specimen: Nasopharyngeal Swab  Result Value Ref Range Status   SARS Coronavirus 2 POSITIVE (A) NEGATIVE Final    Comment: RESULT CALLED TO, READ BACK BY AND VERIFIED WITH: KELLIE NEAL RN ON 11/20/20 AT [facility] (NOTE) SARS-CoV-2 target nucleic acids are DETECTED  SARS-CoV-2 RNA is generally detectable in upper respiratory specimens  during the acute phase of infection.  Positive results are indicative  of the presence of the identified virus, but do not rule out bacterial infection or co-infection with other pathogens not detected by the test.  Clinical correlation with patient history and  other diagnostic information is necessary to determine patient infection status.  The expected result is negative.  Fact Sheet for  Patients:   StrictlyIdeas.no   Fact Sheet for Healthcare Providers:   BankingDealers.co.za    This test is not yet approved or cleared by the [place] FDA and  has been authorized for detection and/or diagnosis of SARS-CoV-2 by FDA under an Emergency Use Authorization (EUA).  This EUA will remain in effect (meaning this  test can be used) for the duration of  the COVID-19 declaration under Section 564(b)(1) of the Act, 21 U.S.C. section 360-bbb-3(b)(1), unless the authorization is terminated or revoked sooner.  Performed at [facility], [address] [place], [place] [address]   Blood Culture (routine x 2)     Status: None   Collection Time: 11/20/20 11:00 PM   Specimen: BLOOD  Result Value Ref Range Status   Specimen Description   Final    BLOOD LEFT ANTECUBITAL Performed at [facility], [address] [place], [place] [address]    Special Requests   Final    BOTTLES DRAWN AEROBIC AND ANAEROBIC Blood Culture adequate volume Performed at [facility], [address] [place], [place] [address]    Culture   Final    NO GROWTH 5 DAYS Performed at [facility], [place] [address], [place], [place] [address]    Report Status 11/26/2020 FINAL  Final  Blood Culture (routine x 2)     Status: None   Collection Time: 11/20/20 11:40 PM   Specimen: BLOOD RIGHT HAND  Result Value Ref Range Status   Specimen Description BLOOD RIGHT HAND  Final   Special Requests   Final    BOTTLES DRAWN AEROBIC AND ANAEROBIC Blood Culture adequate volume   Culture   Final    NO GROWTH 5 DAYS Performed at [facility], [place] [address], [place], [place] [address]  Report Status 11/26/2020 FINAL  Final         Radiology Studies: VAS [place] LOWER EXTREMITY VENOUS (DVT)  Result Date: 11/29/2020  Lower Venous DVT Study Indications: Elevated Ddimer.  Risk Factors: COVID 19 positive. Comparison Study: No  prior studies. Performing Technologist: Oliver Hum RVT  Examination Guidelines: A complete evaluation includes B-mode imaging, spectral Doppler, color Doppler, and power Doppler as needed of all accessible portions of each vessel. Bilateral testing is considered an integral part of a complete examination. Limited examinations for reoccurring indications may be performed as noted. The reflux portion of the exam is performed with the patient in reverse Trendelenburg.   Summary: RIGHT: - There is no evidence of deep vein thrombosis in the lower extremity.  - No cystic structure found in the popliteal fossa.  LEFT: - There is no evidence of deep vein thrombosis in the lower extremity.  - No cystic structure found in the popliteal fossa.    *See table(s) above for measurements and observations. Electronically signed by Jamelle Haring on 11/29/2020 at 3:45:19 PM.    Final         Scheduled Meds: . benzonatate  200 mg Oral TID  . enoxaparin (LOVENOX) injection  40 mg Subcutaneous Q24H  . insulin aspart  0-9 Units Subcutaneous TID WC  . methylPREDNISolone (SOLU-MEDROL) injection  40 mg Intravenous BID  . metoprolol succinate  50 mg Oral Daily   Continuous Infusions: . sodium chloride Stopped (11/22/20 1309)     LOS: 9 days    Time spent: over 50 min    Deatra James, MD [facility]   To contact the attending provider between 7A-7P or the covering provider during after hours 7P-7A, please log into the web site [email or web address] and access using universal [place] password for that web site. If you do not have the password, please call the hospital operator.  11/30/2020, 11:07 AM

## 2020-12-01 DIAGNOSIS — U071 COVID-19: Secondary | ICD-10-CM | POA: Diagnosis not present

## 2020-12-01 DIAGNOSIS — J9601 Acute respiratory failure with hypoxia: Secondary | ICD-10-CM | POA: Diagnosis not present

## 2020-12-01 LAB — BASIC METABOLIC PANEL
Anion gap: 7 (ref 5–15)
BUN: 41 mg/dL — ABNORMAL HIGH (ref 8–23)
CO2: 26 mmol/L (ref 22–32)
Calcium: 7.8 mg/dL — ABNORMAL LOW (ref 8.9–10.3)
Chloride: 100 mmol/L (ref 98–111)
Creatinine, Ser: 0.89 mg/dL (ref 0.44–1.00)
GFR, Estimated: >60 mL/min (ref >60)
Glucose, Bld: 168 mg/dL — ABNORMAL HIGH (ref 70–99)
Potassium: 4.7 mmol/L (ref 3.5–5.1)
Sodium: 133 mmol/L — ABNORMAL LOW (ref 135–145)

## 2020-12-01 LAB — GLUCOSE, CAPILLARY
Glucose-Capillary: 154 mg/dL — ABNORMAL HIGH (ref 70–99)
Glucose-Capillary: 117 mg/dL — ABNORMAL HIGH (ref 70–99)
Glucose-Capillary: 163 mg/dL — ABNORMAL HIGH (ref 70–99)
Glucose-Capillary: 142 mg/dL — ABNORMAL HIGH (ref 70–99)

## 2020-12-01 LAB — BRAIN NATRIURETIC PEPTIDE: B Natriuretic Peptide: 35.5 pg/mL (ref 0.0–100.0)

## 2020-12-01 LAB — CBC
HCT: 44.3 % (ref 36.0–46.0)
Hemoglobin: 14.1 g/dL (ref 12.0–15.0)
MCH: 27.9 pg (ref 26.0–34.0)
MCHC: 31.8 g/dL (ref 30.0–36.0)
MCV: 87.5 fL (ref 80.0–100.0)
Platelets: 319 10*3/uL (ref 150–400)
RBC: 5.06 MIL/uL (ref 3.87–5.11)
RDW: 12.7 % (ref 11.5–15.5)
WBC: 9.9 10*3/uL (ref 4.0–10.5)
nRBC: 0 % (ref 0.0–0.2)

## 2020-12-01 LAB — D-DIMER, QUANTITATIVE: D-Dimer, Quant: 0.84 ug/mL-FEU — ABNORMAL HIGH (ref 0.00–0.50)

## 2020-12-01 MED ORDER — METHYLPREDNISOLONE SODIUM SUCC 40 MG IJ SOLR
40.0000 mg | Freq: Every day | INTRAMUSCULAR | Status: DC
  Administered 2020-12-02 – 2020-12-03 (×2): 40 mg via INTRAVENOUS
  Filled 2020-12-01 (×2): qty 1

## 2020-12-01 MED ORDER — FUROSEMIDE 10 MG/ML IJ SOLN
40.0000 mg | Freq: Once | INTRAMUSCULAR | Status: AC
  Administered 2020-12-01: 40 mg via INTRAVENOUS
  Filled 2020-12-01: qty 4

## 2020-12-01 NOTE — Evaluation (Signed)
Occupational Therapy Evaluation Patient Details Name: Brooke Mora MRN: [id number] DOB: Jul 11, 1929 Today's Date: 12/01/2020    History of Present Illness Pt is a 87 y.o. 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  She has been admitted with COVID 19 with new O2 needs,   Clinical Impression   Pt in recliner upon arrival wanting to gottot the restroom ambulaetd with Sup, transferred to toilet, toileting tasks, hygiene at sink and stepped in and out of shower with Sup. and overall did well.  Pt reports that at home, she was independent  and has support from multiple family members if needed at discharge.  Pt on 3L O2  with 93% O2 SATs; was able to ambulate to bathroom for activity on RA with Sup on 3L O2 but with sats down to 83%. Pt returned to recliner and Delmar put back on. Pt instructed on pursed lip/deep breathing and O2 recovering to 88%. Pt would benefit from acute OT services to address impairments to maximize level of function and safety    Follow Up Recommendations  No OT follow up;Supervision - Intermittent    Equipment Recommendations  Tub/shower seat;Other (comment) (RW, reacher)    Recommendations for Other Services       Precautions / Restrictions Precautions Precautions: Other (comment) Precaution Comments: monitor O2 Restrictions Weight Bearing Restrictions: No      Mobility Bed Mobility               General bed mobility comments: pt in recliner upon arrival    Transfers Overall transfer level: Needs assistance Equipment used: None Transfers: Sit to/from Stand Sit to Stand: Supervision         General transfer comment: mildly unsteady, reaching out for bed/furniture and wall during ambulation to and from bathroom    Balance Overall balance assessment: Needs assistance Sitting-balance support: No upper extremity supported Sitting balance-Leahy Scale: Good     Standing balance support: No upper extremity  supported;During functional activity Standing balance-Leahy Scale: Good                             ADL either performed or assessed with clinical judgement   ADL Overall ADL's : Needs assistance/impaired Eating/Feeding: Independent   Grooming: Wash/dry hands;Wash/dry face;Supervision/safety;Standing   Upper Body Bathing: Set up;Sitting Upper Body Bathing Details (indicate cue type and reason): simulated Lower Body Bathing: Supervison/ safety;Sit to/from stand Lower Body Bathing Details (indicate cue type and reason): simulated Upper Body Dressing : Set up;Sitting   Lower Body Dressing: Supervision/safety;Sit to/from stand   Toilet Transfer: Supervision/safety;Ambulation;Comfort height toilet;Grab bars   Toileting- Clothing Manipulation and Hygiene: Supervision/safety   Tub/ Shower Transfer: Supervision/safety;Ambulation;Grab bars   Functional mobility during ADLs: Supervision/safety       Vision Baseline Vision/History: Wears glasses Patient Visual Report: No change from baseline       Perception     Praxis      Pertinent Vitals/Pain Pain Assessment: No/denies pain Pain Score: 0-No pain Pain Intervention(s): Monitored during session     Hand Dominance Right   Extremity/Trunk Assessment Upper Extremity Assessment Upper Extremity Assessment: Overall WFL for tasks assessed   Lower Extremity Assessment Lower Extremity Assessment: Defer to PT evaluation   Cervical / Trunk Assessment Cervical / Trunk Assessment: Normal   Communication Communication Communication: HOH;Prefers language other than English (Speaks [place], some [place])   Cognition Arousal/Alertness: Awake/alert Behavior During Therapy: WFL for tasks assessed/performed  Overall Cognitive Status: Within Functional Limits for tasks assessed                                     General Comments       Exercises     Shoulder Instructions      Home Living  Family/patient expects to be discharged to:: Private residence Living Arrangements: Other (Comment) Available Help at Discharge: Family;Available 24 hours/day Type of Home: House Home Access: Stairs to enter [employer] of Steps: 2   Home Layout: One level     Bathroom Shower/Tub: Tub/shower unit;Walk-in shower         Home Equipment: None          Prior Functioning/Environment Level of Independence: Independent        Comments: Reports independent with ambulation, ADLs, and IADLs. Stated "I do well for almost 90, even my makeup and nails"; denies falls        OT Problem List: Decreased activity tolerance;Decreased knowledge of use of DME or AE      OT Treatment/Interventions: Self-care/ADL training;Energy conservation;Therapeutic activities;DME and/or AE instruction;Patient/family education    OT Goals(Current goals can be found in the care plan section) Acute Rehab OT Goals Patient Stated Goal: return home OT Goal Formulation: With patient Time For Goal Achievement: 12/15/20 Potential to Achieve Goals: Good ADL Goals Additional ADL Goal #2: Pt will complete all ADL/selfcare tasks Mod I Additional ADL Goal #3: pt will verbalize and demo 3/3 energy conservation techniques for ADLs and ADL mobility  OT Frequency: Min 2X/week   Barriers to D/C:            Co-evaluation              AM-PAC OT "6 Clicks" Daily Activity     Outcome Measure Help from another person eating meals?: None Help from another person taking care of personal grooming?: None Help from another person toileting, which includes using toliet, bedpan, or urinal?: A Little Help from another person bathing (including washing, rinsing, drying)?: A Little Help from another person to put on and taking off regular upper body clothing?: None Help from another person to put on and taking off regular lower body clothing?: A Little 6 Click Score: 21   End of Session Equipment Utilized  During Treatment: Oxygen;Other (comment) (3L O2)  Activity Tolerance: Patient limited by fatigue Patient left: in chair;with call bell/phone within reach  OT Visit Diagnosis: Unsteadiness on feet (R26.81)                Time: 2876-8115 OT Time Calculation (min): 31 min Charges:  OT General Charges $OT Visit: 1 Visit OT Evaluation $OT Eval Moderate Complexity: 1 Mod OT Treatments $Self Care/Home Management : 8-22 mins    Britt Bottom 12/01/2020, 2:20 PM

## 2020-12-01 NOTE — Progress Notes (Signed)
Pt refused CPAP tonight. CPAP SB.

## 2020-12-01 NOTE — Progress Notes (Signed)
PROGRESS NOTE    Brooke Mora  JAS:[id number] DOB: [birth year] DOA: 11/20/2020 PCP: Benito Mccreedy, MD   Chief Complaint  Patient presents with  . Hypoxic   Subjective: The patient was seen and examined this morning, nonlabored breathing, improved O2 sat 92% but still on 4 L of oxygen No issues overnight  Brief Narrative:  Brooke Mora is a 87 y.o. female with medical history significant of COPD/emphysema, HTN, prior MAI off of treatment since early 2018 (saw [name]).  She's been admitted for covid pneumonia.  She has continued oxygen requirement.  Has required ~4 L past several days.  She'd initially looked like she might be ready for discharge late last week, but her O2 requirement has persistent and she appears weaker as well.  Continue steroids.  Completed remdesivir.  Holding off on actemra/baricitinib as she has hx of MAI and has only modest O2 requirement at this time.  Assessment & Plan:   Principal Problem:   Acute hypoxemic respiratory failure due to COVID-19 [facility]) Active Problems:   Cavitary lesion of lung   HTN (hypertension)  1. COVID-19 with new O2 requirement - 1. Remains on 4 L oxygen, satting > 92%... Will taper down to 2 L of oxygen 2. Anticipating reevaluation with PT -- 3. Minimal improvement with supplemental oxygen... Encourage incentive spirometer use 4. CT chest with extensive bilateral ground glass pulm infiltrates, mild emphysema, tumefactive scarring at lung apices bilaterally  5. CTA reviewed once again no signs of pulmonary embolism 6. CXR 1/31 viral pneumonia  7. Steroids (1/25-present --- tapering down 8. Remdesivir (1/25-30).   9. Consider actemra/baricitinib (given hx of prior MAI, discussed with ID who recommended avoiding this if possible - if she declines or has increasing O2 requirement, would be worth considering this ). 10. Increasing steroid dose ... Inhalers, respiratory use of CPAP 11. Strict I/O, daily weights - net positive,  trial lasix 12. Prone as able, OOB, IS, flutter, therapy 13. Continue to improve  COVID-19 Labs  Recent Labs    11/29/20 0337 11/30/20 0358 12/01/20 0445  DDIMER  --  0.98* 0.84*  CRP <0.5  --   --    Elevated D-dimer, CTA of the chest was once again reviewed, no signs of pulmonary embolism, obtaining a bilateral lower extremity Doppler study to rule out DVT --we will holding current therapeutic dose of anticoagulation    Lab Results  Component Value Date   SARSCOV2NAA POSITIVE (A) 11/20/2020   2. Prediabetes  Steroid Induced Hyperglycemia 1. Follow A1c (6.3), SSI 2. Diabetic diet 3. Stable  3. Hx MAI 1. S/p treatment with ID, Dr. [name].  Following with pulm as well. 2. Consider discussion with ID if considering actemra/baricitinib (as above) 3. Increasing steroids dose--- no changes  4. OSA 1. CPAP nightly--- respiratory evaluate, increased use of CPAP due to respiratory distress secondary to Covid 2. Remained stable  5. HTN - 1. Cont home metoprolol 2. Monitoring BP  6.   Generalized weakness  Appreciate PT evaluation recommendation  DVT prophylaxis: lovenox Code Status: full  Family Communication: none at bedside - granddaughter  Disposition:   Status is: Inpatient  Remains inpatient appropriate because:Inpatient level of care appropriate due to severity of illness   Dispo: The patient is from: Home              Anticipated d/c is to: Home              Anticipated d/c date is: in 1-2 days, as  soon as her O2 sat improved to likely room  air-or minimal O2 demand (she was not O2 dependent prior to this admission)              Patient currently is not medically stable to d/c.   Difficult to place patient No       Consultants:   none  Procedures:  none  Antimicrobials:  Anti-infectives (From admission, onward)   Start     Dose/Rate Route Frequency Ordered Stop   11/22/20 1000  remdesivir 100 mg in sodium chloride 0.9 % 100 mL IVPB         100 mg 200 mL/hr over 30 Minutes Intravenous Daily 11/20/20 2320 11/25/20 0926   11/21/20 0000  remdesivir 100 mg in sodium chloride 0.9 % 100 mL IVPB        100 mg 200 mL/hr over 30 Minutes Intravenous Every 30 min 11/20/20 2320 11/21/20 0140           Interpreter used  Objective: Vitals:   11/30/20 1449 11/30/20 2145 12/01/20 0500 12/01/20 0602  BP: 135/70 (!) 144/64  140/80  Pulse: 61 (!) 55  63  Resp: 18 20  18   Temp: 98 F (36.7 C) 98.2 F (36.8 C)  97.9 F (36.6 C)  TempSrc: Oral     SpO2: 92% 93%  92%  Weight:   82 kg   Height:        Intake/Output Summary (Last 24 hours) at 12/01/2020 1009 Last data filed at 11/30/2020 1448 Gross per 24 hour  Intake 236 ml  Output --  Net 236 ml   Filed Weights   11/29/20 0500 11/30/20 0500 12/01/20 0500  Weight: 81.1 kg 82.1 kg 82 kg        Physical Exam:   General:  Alert, oriented, cooperative, no distress; still on 4 L of oxygen  HEENT:  Normocephalic, PERRL, otherwise with in Normal limits   Neuro:  CNII-XII intact. , normal motor and sensation, reflexes intact   Lungs:    Improved rhonchi, rales,  respirations unlabored, no wheezes / crackles  Cardio:    S1/S2, RRR, No murmure, No Rubs or Gallops   Abdomen:   Soft, non-tender, bowel sounds active all four quadrants,  no guarding or peritoneal signs.  Muscular skeletal:  Limited exam - in bed, able to move all 4 extremities, Normal strength,  2+ pulses,  symmetric, No pitting edema  Skin:  Dry, warm to touch, negative for any Rashes,  Wounds: Please see nursing documentation                       Data Reviewed: I have personally reviewed following labs and imaging studies  CBC: Recent Labs  Lab 11/25/20 0413 11/26/20 0410 11/27/20 0404 11/28/20 0434 11/30/20 0358 12/01/20 0445  WBC 5.9 9.7 10.6* 9.3 7.1 9.9  NEUTROABS 4.7 8.1* 8.9* 7.9*  --   --   HGB 15.4* 15.2* 15.3* 16.2* 14.6 14.1  HCT 47.3* 44.9 47.5* 49.2* 45.6 44.3  MCV 85.8 83.8  86.4 84.5 87.2 87.5  PLT 227 232 303 393 328 865    Basic Metabolic Panel: Recent Labs  Lab 11/25/20 0413 11/26/20 0410 11/27/20 0404 11/28/20 0434 11/30/20 0358 12/01/20 0445  NA 135 137 138 136 138 133*  K 4.0 4.4 4.5 4.3 4.5 4.7  CL 100 100 101 99 100 100  CO2 23 25 26 26 27 26   GLUCOSE 174* 171* 181* 179* 183* 168*  BUN 24* 23 27* 33* 37* 41*  CREATININE 0.81 0.85 0.82 0.97 0.90 0.89  CALCIUM 8.1* 8.1* 8.1* 8.1* 7.8* 7.8*  MG 2.4 2.3 2.5* 2.4  --   --   PHOS 3.6 3.4 3.7 4.1  --   --     GFR: Estimated Creatinine Clearance: 45.3 mL/min (by C-G formula based on SCr of 0.89 mg/dL).  Liver Function Tests: Recent Labs  Lab 11/25/20 0413 11/26/20 0410 11/27/20 0404 11/28/20 0434  AST 22 23 19 21   ALT 23 17 20 21   ALKPHOS 58 55 55 56  BILITOT 0.6 1.0 0.6 0.8  PROT 6.8 6.3* 6.3* 6.5  ALBUMIN 3.4* 3.1* 3.2* 3.3*    CBG: Recent Labs  Lab 11/30/20 0727 11/30/20 1128 11/30/20 1642 11/30/20 2146 12/01/20 0812  GLUCAP 144* 128* 166* 134* 154*     No results found for this or any previous visit (from the past 240 hour(s)).       Radiology Studies: VAS [place] LOWER EXTREMITY VENOUS (DVT)  Result Date: 11/29/2020  Lower Venous DVT Study Indications: Elevated Ddimer.  Risk Factors: COVID 19 positive. Comparison Study: No prior studies. Performing Technologist: Oliver Hum RVT  Examination Guidelines: A complete evaluation includes B-mode imaging, spectral Doppler, color Doppler, and power Doppler as needed of all accessible portions of each vessel. Bilateral testing is considered an integral part of a complete examination. Limited examinations for reoccurring indications may be performed as noted. The reflux portion of the exam is performed with the patient in reverse Trendelenburg.   Summary: RIGHT: - There is no evidence of deep vein thrombosis in the lower extremity.  - No cystic structure found in the popliteal fossa.  LEFT: - There is no evidence of deep vein  thrombosis in the lower extremity.  - No cystic structure found in the popliteal fossa.    *See table(s) above for measurements and observations. Electronically signed by Jamelle Haring on 11/29/2020 at 3:45:19 PM.    Final         Scheduled Meds: . benzonatate  200 mg Oral TID  . enoxaparin (LOVENOX) injection  40 mg Subcutaneous Q24H  . insulin aspart  0-9 Units Subcutaneous TID WC  . methylPREDNISolone (SOLU-MEDROL) injection  40 mg Intravenous BID  . metoprolol succinate  50 mg Oral Daily  . sodium chloride flush  10-40 mL Intracatheter Q12H   Continuous Infusions: . sodium chloride Stopped (11/22/20 1309)     LOS: 10 days    Time spent: over 69 min    Deatra James, MD [facility]   To contact the attending provider between 7A-7P or the covering provider during after hours 7P-7A, please log into the web site [email or web address] and access using universal [place] password for that web site. If you do not have the password, please call the hospital operator.  12/01/2020, 10:09 AM

## 2020-12-01 NOTE — Plan of Care (Signed)
  Problem: Education: Goal: Knowledge of risk factors and measures for prevention of condition will improve Outcome: Progressing   Problem: Respiratory: Goal: Complications related to the disease process, condition or treatment will be avoided or minimized Outcome: Progressing   Problem: Education: Goal: Knowledge of General Education information will improve Description: Including pain rating scale, medication(s)/side effects and non-pharmacologic comfort measures Outcome: Progressing   Problem: Health Behavior/Discharge Planning: Goal: Ability to manage health-related needs will improve Outcome: Progressing   Problem: Clinical Measurements: Goal: Ability to maintain clinical measurements within normal limits will improve Outcome: Progressing Goal: Will remain free from infection Outcome: Progressing Goal: Diagnostic test results will improve Outcome: Progressing   Problem: Activity: Goal: Risk for activity intolerance will decrease Outcome: Progressing   Problem: Nutrition: Goal: Adequate nutrition will be maintained Outcome: Progressing   Problem: Elimination: Goal: Will not experience complications related to bowel motility Outcome: Progressing   Problem: Pain Managment: Goal: General experience of comfort will improve Outcome: Progressing   Problem: Safety: Goal: Ability to remain free from injury will improve Outcome: Progressing   Problem: Skin Integrity: Goal: Risk for impaired skin integrity will decrease Outcome: Progressing

## 2020-12-02 DIAGNOSIS — U071 COVID-19: Secondary | ICD-10-CM | POA: Diagnosis not present

## 2020-12-02 DIAGNOSIS — J9601 Acute respiratory failure with hypoxia: Secondary | ICD-10-CM | POA: Diagnosis not present

## 2020-12-02 LAB — CBC
HCT: 46.7 % — ABNORMAL HIGH (ref 36.0–46.0)
Hemoglobin: 15.1 g/dL — ABNORMAL HIGH (ref 12.0–15.0)
MCH: 28.2 pg (ref 26.0–34.0)
MCHC: 32.3 g/dL (ref 30.0–36.0)
MCV: 87.1 fL (ref 80.0–100.0)
Platelets: 323 10*3/uL (ref 150–400)
RBC: 5.36 MIL/uL — ABNORMAL HIGH (ref 3.87–5.11)
RDW: 12.9 % (ref 11.5–15.5)
WBC: 11.3 10*3/uL — ABNORMAL HIGH (ref 4.0–10.5)
nRBC: 0 % (ref 0.0–0.2)

## 2020-12-02 LAB — GLUCOSE, CAPILLARY
Glucose-Capillary: 95 mg/dL (ref 70–99)
Glucose-Capillary: 122 mg/dL — ABNORMAL HIGH (ref 70–99)
Glucose-Capillary: 143 mg/dL — ABNORMAL HIGH (ref 70–99)
Glucose-Capillary: 143 mg/dL — ABNORMAL HIGH (ref 70–99)

## 2020-12-02 LAB — BASIC METABOLIC PANEL
Anion gap: 12 (ref 5–15)
BUN: 43 mg/dL — ABNORMAL HIGH (ref 8–23)
CO2: 29 mmol/L (ref 22–32)
Calcium: 8.3 mg/dL — ABNORMAL LOW (ref 8.9–10.3)
Chloride: 99 mmol/L (ref 98–111)
Creatinine, Ser: 0.93 mg/dL (ref 0.44–1.00)
GFR, Estimated: 59 mL/min — ABNORMAL LOW (ref >60)
Glucose, Bld: 108 mg/dL — ABNORMAL HIGH (ref 70–99)
Potassium: 4.2 mmol/L (ref 3.5–5.1)
Sodium: 140 mmol/L (ref 135–145)

## 2020-12-02 LAB — D-DIMER, QUANTITATIVE: D-Dimer, Quant: 1.03 ug/mL-FEU — ABNORMAL HIGH (ref 0.00–0.50)

## 2020-12-02 MED ORDER — METOPROLOL SUCCINATE ER 25 MG PO TB24
12.5000 mg | ORAL_TABLET | Freq: Every day | ORAL | Status: DC
  Administered 2020-12-03 – 2020-12-04 (×2): 12.5 mg via ORAL
  Filled 2020-12-02 (×2): qty 1

## 2020-12-02 MED ORDER — FUROSEMIDE 10 MG/ML IJ SOLN
40.0000 mg | Freq: Once | INTRAMUSCULAR | Status: AC
  Administered 2020-12-02: 40 mg via INTRAVENOUS
  Filled 2020-12-02: qty 4

## 2020-12-02 NOTE — Progress Notes (Signed)
PROGRESS NOTE    Brooke Mora  CHE:[id number] DOB: Sep 01, 1929 DOA: 11/20/2020 PCP: Benito Mccreedy, MD   Chief Complaint  Patient presents with  . Hypoxic   Subjective: The patient was seen and examined this morning, stable at rest patient was weaned down from 42 L of oxygen, with saturation of 90% Per nursing staff with ambulation patient dropped to low 80s this morning  Brief Narrative:  Brooke Mora is a 87 y.o. female with medical history significant of COPD/emphysema, HTN, prior MAI off of treatment since early 2018 (saw [name]).  She's been admitted for covid pneumonia.  She has continued oxygen requirement.  Has required ~4 L past several days.  She'd initially looked like she might be ready for discharge late last week, but her O2 requirement has persistent and she appears weaker as well.  Continue steroids.  Completed remdesivir.  Holding off on actemra/baricitinib as she has hx of MAI and has only modest O2 requirement at this time.  Assessment & Plan:   Principal Problem:   Acute hypoxemic respiratory failure due to COVID-19 [facility]) Active Problems:   Cavitary lesion of lung   HTN (hypertension)  1. COVID-19 with new O2 requirement - 1. Patient was weaned off from 4 L to 2 L of oxygen, satting 90% 2. This morning with ambulation on oxygen she dropped to low 80s 3. PT reevaluation requested 4. Minimal improvement with supplemental oxygen... Encourage incentive spirometer use 5. CT chest with extensive bilateral ground glass pulm infiltrates, mild emphysema, tumefactive scarring at lung apices bilaterally  6. CTA reviewed once again no signs of pulmonary embolism 7. CXR 1/31 viral pneumonia  8. Steroids (1/25-present --- tapering down 9. Remdesivir (1/25-30).   10. She did not complete treatment with actemra/baricitinib (given hx of prior MAI, discussed with ID who recommended avoiding this if possible ). 11. Steroid dose taper... Inhalers, respiratory use of  CPAP-she has refused CPAP 12. Strict I/O, daily weights - net positive, trial lasix 13. Prone as able, OOB, IS, flutter, therapy 14. Continue to improve   COVID-19 Labs  Recent Labs    11/30/20 0358 12/01/20 0445 12/02/20 0547  DDIMER 0.98* 0.84* 1.03*   Elevated D-dimer, CTA of the chest was once again reviewed, no signs of pulmonary embolism, obtaining a bilateral lower extremity Doppler study to rule out DVT --we will holding current therapeutic dose of anticoagulation    Lab Results  Component Value Date   SARSCOV2NAA POSITIVE (A) 11/20/2020   2. Prediabetes  Steroid Induced Hyperglycemia 1. Follow A1c (6.3), SSI 2. Diabetic diet 3. Stable monitor as needed  3. Hx MAI 1. S/p treatment with ID, Dr. [name].  Following with pulm as well. 2. Consider discussion with ID if considering actemra/baricitinib (as above) 3. On steroids --- no changes remained stable  4. OSA 1. CPAP nightly--- respiratory evaluate, per respiratory she has been refusing it 2. Remained stable  5. HTN - 1. Cont home metoprolol 2. Monitoring BP 3. Remained stable  6.   Generalized weakness  Appreciate PT evaluation recommendation  DVT prophylaxis: lovenox Code Status: full  Family Communication: none at bedside -discussed with granddaughter Disposition:   Status is: Inpatient  Remains inpatient appropriate because:Inpatient level of care appropriate due to severity of illness   Dispo: The patient is from: Home              Anticipated d/c is to: Home              Anticipated  d/c date is: in 1-2 days, as soon as her O2 sat improved to likely room  air-or minimal O2 demand (she was not O2 dependent prior to this admission)              Patient currently is not medically stable to d/c.   Difficult to place patient No       Consultants:   none  Procedures:  none  Antimicrobials:  Anti-infectives (From admission, onward)   Start     Dose/Rate Route Frequency Ordered Stop    11/22/20 1000  remdesivir 100 mg in sodium chloride 0.9 % 100 mL IVPB        100 mg 200 mL/hr over 30 Minutes Intravenous Daily 11/20/20 2320 11/25/20 0926   11/21/20 0000  remdesivir 100 mg in sodium chloride 0.9 % 100 mL IVPB        100 mg 200 mL/hr over 30 Minutes Intravenous Every 30 min 11/20/20 2320 11/21/20 0140           Interpreter used  Objective: Vitals:   12/01/20 2140 12/02/20 0557 12/02/20 0805 12/02/20 0900  BP: (!) 148/79 135/61    Pulse: 69 (!) 52 66   Resp: (!) 22 14    Temp: 97.6 F (36.4 C) 98.2 F (36.8 C)    TempSrc: Oral     SpO2: 91% 91%  90%  Weight:  80.9 kg    Height:        Intake/Output Summary (Last 24 hours) at 12/02/2020 1053 Last data filed at 12/02/2020 0900 Gross per 24 hour  Intake 720 ml  Output --  Net 720 ml   Filed Weights   11/30/20 0500 12/01/20 0500 12/02/20 0557  Weight: 82.1 kg 82 kg 80.9 kg     Physical Exam:   General:  Alert, oriented, cooperative, no distress;   HEENT:  Normocephalic, PERRL, otherwise with in Normal limits   Neuro:  CNII-XII intact. , normal motor and sensation, reflexes intact   Lungs:    Much improved  bronchitis, Respirations unlabored, no wheezes / crackles  Cardio:    S1/S2, RRR, No murmure, No Rubs or Gallops   Abdomen:   Soft, non-tender, bowel sounds active all four quadrants,  no guarding or peritoneal signs.  Muscular skeletal:   Generalized weakness, Limited exam - in bed, able to move all 4 extremities, Normal strength,  2+ pulses,  symmetric, No pitting edema  Skin:  Dry, warm to touch, negative for any Rashes,  Wounds: Please see nursing documentation            Data Reviewed: I have personally reviewed following labs and imaging studies  CBC: Recent Labs  Lab 11/26/20 0410 11/27/20 0404 11/28/20 0434 11/30/20 0358 12/01/20 0445 12/02/20 0547  WBC 9.7 10.6* 9.3 7.1 9.9 11.3*  NEUTROABS 8.1* 8.9* 7.9*  --   --   --   HGB 15.2* 15.3* 16.2* 14.6 14.1 15.1*  HCT 44.9  47.5* 49.2* 45.6 44.3 46.7*  MCV 83.8 86.4 84.5 87.2 87.5 87.1  PLT 232 303 393 328 319 967    Basic Metabolic Panel: Recent Labs  Lab 11/26/20 0410 11/27/20 0404 11/28/20 0434 11/30/20 0358 12/01/20 0445 12/02/20 0547  NA 137 138 136 138 133* 140  K 4.4 4.5 4.3 4.5 4.7 4.2  CL 100 101 99 100 100 99  CO2 25 26 26 27 26 29   GLUCOSE 171* 181* 179* 183* 168* 108*  BUN 23 27* 33* 37* 41* 43*  CREATININE  0.85 0.82 0.97 0.90 0.89 0.93  CALCIUM 8.1* 8.1* 8.1* 7.8* 7.8* 8.3*  MG 2.3 2.5* 2.4  --   --   --   PHOS 3.4 3.7 4.1  --   --   --     GFR: Estimated Creatinine Clearance: 43.1 mL/min (by C-G formula based on SCr of 0.93 mg/dL).  Liver Function Tests: Recent Labs  Lab 11/26/20 0410 11/27/20 0404 11/28/20 0434  AST 23 19 21   ALT 17 20 21   ALKPHOS 55 55 56  BILITOT 1.0 0.6 0.8  PROT 6.3* 6.3* 6.5  ALBUMIN 3.1* 3.2* 3.3*    CBG: Recent Labs  Lab 12/01/20 0812 12/01/20 1206 12/01/20 1655 12/01/20 2145 12/02/20 0744  GLUCAP 154* 117* 163* 142* 95     No results found for this or any previous visit (from the past 240 hour(s)).       Radiology Studies: VAS [place] LOWER EXTREMITY VENOUS (DVT)  Result Date: 11/29/2020  Lower Venous DVT Study Indications: Elevated Ddimer.  Risk Factors: COVID 19 positive. Comparison Study: No prior studies. Performing Technologist: Oliver Hum RVT  Examination Guidelines: A complete evaluation includes B-mode imaging, spectral Doppler, color Doppler, and power Doppler as needed of all accessible portions of each vessel. Bilateral testing is considered an integral part of a complete examination. Limited examinations for reoccurring indications may be performed as noted. The reflux portion of the exam is performed with the patient in reverse Trendelenburg.   Summary: RIGHT: - There is no evidence of deep vein thrombosis in the lower extremity.  - No cystic structure found in the popliteal fossa.  LEFT: - There is no evidence of deep  vein thrombosis in the lower extremity.  - No cystic structure found in the popliteal fossa.    *See table(s) above for measurements and observations. Electronically signed by Jamelle Haring on 11/29/2020 at 3:45:19 PM.    Final         Scheduled Meds: . benzonatate  200 mg Oral TID  . enoxaparin (LOVENOX) injection  40 mg Subcutaneous Q24H  . insulin aspart  0-9 Units Subcutaneous TID WC  . methylPREDNISolone (SOLU-MEDROL) injection  40 mg Intravenous Daily  . metoprolol succinate  50 mg Oral Daily  . sodium chloride flush  10-40 mL Intracatheter Q12H   Continuous Infusions: . sodium chloride Stopped (11/22/20 1309)     LOS: 11 days    Time spent: over 77 min    Deatra James, MD [facility]   To contact the attending provider between 7A-7P or the covering provider during after hours 7P-7A, please log into the web site [email or web address] and access using universal [place] password for that web site. If you do not have the password, please call the hospital operator.  12/02/2020, 10:53 AM

## 2020-12-02 NOTE — Progress Notes (Signed)
Pt refused CPAP qhs.  Pt encouraged to contact RT should she change her mind.  

## 2020-12-03 DIAGNOSIS — U071 COVID-19: Secondary | ICD-10-CM | POA: Diagnosis not present

## 2020-12-03 DIAGNOSIS — J9601 Acute respiratory failure with hypoxia: Secondary | ICD-10-CM | POA: Diagnosis not present

## 2020-12-03 LAB — BASIC METABOLIC PANEL
Anion gap: 14 (ref 5–15)
BUN: 44 mg/dL — ABNORMAL HIGH (ref 8–23)
CO2: 28 mmol/L (ref 22–32)
Calcium: 8.1 mg/dL — ABNORMAL LOW (ref 8.9–10.3)
Chloride: 96 mmol/L — ABNORMAL LOW (ref 98–111)
Creatinine, Ser: 1 mg/dL (ref 0.44–1.00)
GFR, Estimated: 54 mL/min — ABNORMAL LOW (ref >60)
Glucose, Bld: 113 mg/dL — ABNORMAL HIGH (ref 70–99)
Potassium: 4.1 mmol/L (ref 3.5–5.1)
Sodium: 138 mmol/L (ref 135–145)

## 2020-12-03 LAB — GLUCOSE, CAPILLARY
Glucose-Capillary: 87 mg/dL (ref 70–99)
Glucose-Capillary: 99 mg/dL (ref 70–99)
Glucose-Capillary: 187 mg/dL — ABNORMAL HIGH (ref 70–99)
Glucose-Capillary: 205 mg/dL — ABNORMAL HIGH (ref 70–99)

## 2020-12-03 LAB — CBC
HCT: 49.2 % — ABNORMAL HIGH (ref 36.0–46.0)
Hemoglobin: 15.6 g/dL — ABNORMAL HIGH (ref 12.0–15.0)
MCH: 28 pg (ref 26.0–34.0)
MCHC: 31.7 g/dL (ref 30.0–36.0)
MCV: 88.3 fL (ref 80.0–100.0)
Platelets: 318 10*3/uL (ref 150–400)
RBC: 5.57 MIL/uL — ABNORMAL HIGH (ref 3.87–5.11)
RDW: 12.8 % (ref 11.5–15.5)
WBC: 12 10*3/uL — ABNORMAL HIGH (ref 4.0–10.5)
nRBC: 0 % (ref 0.0–0.2)

## 2020-12-03 LAB — D-DIMER, QUANTITATIVE: D-Dimer, Quant: 0.97 ug/mL-FEU — ABNORMAL HIGH (ref 0.00–0.50)

## 2020-12-03 MED ORDER — PREDNISONE 20 MG PO TABS: 40.0000 mg | ORAL_TABLET | Freq: Every day | ORAL | Status: DC

## 2020-12-03 MED ORDER — FUROSEMIDE 10 MG/ML IJ SOLN
40.0000 mg | Freq: Once | INTRAMUSCULAR | Status: AC
  Administered 2020-12-03: 40 mg via INTRAVENOUS
  Filled 2020-12-03: qty 4

## 2020-12-03 NOTE — Progress Notes (Signed)
Occupational Therapy Treatment Patient Details Name: Brooke Mora MRN: [id number] DOB: 10-Dec-1928 Today's Date: 12/03/2020    History of present illness Pt is a 87 y.o. 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  She has been admitted with COVID 19 with new O2 needs,   OT comments  Patient progressing and showed improved ability to perform LE dressing while seated Mod I and functional mobility needed for bathroom and kitchen safety, with supervision to Mod I, compared to previous session. Patient limited by impaired activity tolerance with SpO2 lowest of 83% on 2L via [place] with long sessions of rest and PLB needed to bring SpO2 back to 90%, along with deficits noted below. Pt continues to demonstrate good rehab potential and would benefit from continued skilled OT to increase safety and independence with ADLs and functional transfers to allow pt to return home safely and reduce caregiver burden and fall risk. Anticipate only need of 1 more session to provide literature and education on energy conservation. Pt anticipates d/c home soon and home health OT can also address energy conservation after d/c if needed.    Follow Up Recommendations  No OT follow up;Supervision - Intermittent    Equipment Recommendations  Tub/shower seat;Other (comment) (Reacher, RW)    Recommendations for Other Services      Precautions / Restrictions Precautions Precautions: Other (comment) Precaution Comments: monitor O2, [place]/Contact Restrictions Weight Bearing Restrictions: No       Mobility Bed Mobility               General bed mobility comments: pt in recliner upon arrival  Transfers   Equipment used: None Transfers: Sit to/from Stand Sit to Stand: Modified independent (Device/Increase time)         General transfer comment: Pt ambulated in room without AD Mod I, being careful to manage her lines to avoid tripping. Pt required verbal cues for  rest as SpO2 monitored.    Balance Overall balance assessment: Mild deficits observed, not formally tested Sitting-balance support: No upper extremity supported Sitting balance-Leahy Scale: Good     Standing balance support: No upper extremity supported;During functional activity Standing balance-Leahy Scale: Good                             ADL either performed or assessed with clinical judgement   ADL   Eating/Feeding: Independent   Grooming: Wash/dry hands;Wash/dry face;Supervision/safety;Standing               Lower Body Dressing: Modified independent Lower Body Dressing Details (indicate cue type and reason): Pt able to lean down to floor from seated recliner position and don shoes, using index finger as "shoe horn" Mod I.   Toilet Transfer Details (indicate cue type and reason): Pt denied need to void.         Functional mobility during ADLs: Supervision/safety General ADL Comments: It appears pt is able to perform most ADLs at or near her baseline level, however pt continues to desaturate to 83% with light out of bed activity and takes several min of PLB to reach 86% and several more min to reach 90% at rest. Pt needs frequent cues to continue with PLB and seems to be unbothered and assymptomatic at 86%.     Vision   Vision Assessment?: No apparent visual deficits   Perception     Praxis      Cognition Arousal/Alertness: Awake/alert Behavior  During Therapy: WFL for tasks assessed/performed Overall Cognitive Status: Within Functional Limits for tasks assessed                                 General Comments: Pt seems to be HOH on top of mostly non-English speaking.  OT and RN unable to locate IPAD for translation. Pt very pleasant.        Exercises     Shoulder Instructions       General Comments      Pertinent Vitals/ Pain       Pain Assessment: No/denies pain  Home Living                                           Prior Functioning/Environment              Frequency  Min 2X/week        Progress Toward Goals  OT Goals(current goals can now be found in the care plan section)  Progress towards OT goals: Progressing toward goals  Acute Rehab OT Goals Patient Stated Goal: return home OT Goal Formulation: With patient Time For Goal Achievement: 12/15/20 Potential to Achieve Goals: Good  Plan Discharge plan remains appropriate    Co-evaluation                 AM-PAC OT "6 Clicks" Daily Activity     Outcome Measure   Help from another person eating meals?: None Help from another person taking care of personal grooming?: None Help from another person toileting, which includes using toliet, bedpan, or urinal?: A Little Help from another person bathing (including washing, rinsing, drying)?: A Little Help from another person to put on and taking off regular upper body clothing?: None Help from another person to put on and taking off regular lower body clothing?: A Little 6 Click Score: 21    End of Session Equipment Utilized During Treatment: Oxygen;Other (comment) (2L)  OT Visit Diagnosis: Unsteadiness on feet (R26.81)   Activity Tolerance Patient limited by fatigue   Patient Left in chair;with call bell/phone within reach   Nurse Communication Mobility status (SpO2 changes)        Time: 0947-0962 OT Time Calculation (min): 23 min  Charges: OT General Charges $OT Visit: 1 Visit OT Treatments $Self Care/Home Management : 8-22 mins $Therapeutic Activity: 8-22 mins  Anderson [place], [place] Office: [phone] 12/03/2020   Julien Girt 12/03/2020, 1:22 PM

## 2020-12-03 NOTE — Progress Notes (Signed)
Pt continues to decline CPAP QHS.

## 2020-12-03 NOTE — Progress Notes (Signed)
PROGRESS NOTE  Brooke Mora  DOB: 1929-02-01  PCP: Benito Mccreedy, MD HBZ:[id number]  DOA: 11/20/2020  LOS: 12 days   Chief Complaint  Patient presents with  . Hypoxic   Brief narrative: Brooke Mora is a 87 y.o. female with PMH significant for COPD/emphysema, HTN, prior MAI off of treatment since early 2018 (saw [name]). Patient presented to the ED on 1/25 with shortness of breath.  She was admitted and treated for Covid pneumonia.  Despite adequate/appropriate treatment, her oxygen demand was persistent.  She was placed on a long course of oral steroids. Gradually improving now.  Subjective: Patient was seen and examined this morning.  [name].  Sitting up in chair.  Not in distress. At rest, she is requiring 2 L to maintain saturation close to 90%. On ambulation, her oxygen requirement significantly increased. Today with 6 L oxygen, saturation was at the maximum of 86%.  Assessment/Plan: COVID pneumonia Acute respiratory failure with hypoxia  -Presented with hypoxia -COVID test: PCR positive on admission -Chest imaging: CT chest on admission with extensive bilateral groundglass pulmonary infiltrates  -Treatment: Completed 5-day course of IV remdesivir.  Despite appropriate treatment for Covid pneumonia, patient continues to have high oxygen requirement.  On a protracted course of steroids, currently on IV Solu-Medrol 40 mg daily.  Switch to oral prednisone today. -Patient has bilateral 1+ pedal edema.  1 dose of IV Lasix was given yesterday.  We will repeat 1 today.  Last echo from 2020 with normal EF. -Continue supplemental oxygen. -Plan to discharge home tomorrow on supplemental oxygen. -Continue supportive care: Vitamin C, Zinc, PRN inhalers, Tylenol, Antitussives (benzonatate/ Mucinex/Tussionex).   -Encouraged incentive spirometry, prone position, out of bed and early mobilization as much as possible -Continue airborne/contact isolation  precautions for duration of 3 weeks from the day of diagnosis. -WBC and inflammatory markers trend as below.  Recent Labs  Lab 11/27/20 0404 11/28/20 0434 11/29/20 0337 11/30/20 0358 12/01/20 0445 12/02/20 0547 12/03/20 0621  WBC 10.6* 9.3  --  7.1 9.9 11.3* 12.0*  PROCALCITON <0.10 <0.10 <0.10  --   --   --   --   DDIMER 0.98* 1.09*  --  0.98* 0.84* 1.03* 0.97*  FERRITIN 231 242  --   --   --   --   --   CRP 0.7 <0.5 <0.5  --   --   --   --   ALT 20 21  --   --   --   --   --    Prediabetes Steroid-induced hyperglycemia -A1c 6.3 on 1/30 -Currently diabetic diet.  Blood sugar level in normal range.  Not on scheduled insulin.  Hx MAI Off treatment since 2018.  Was following up with ID Dr. [name].  OSA CPAP nightly-  Essential hypertension -Continue Toprol 50 mg daily.  Generalized weakness -PT eval obtained.  Home health PT was recommended.  Mobility: Encourage ambulation Code Status:   Code Status: Full Code  Nutritional status: Body mass index is 29.9 kg/m.     Diet Order            Diet Heart Room service appropriate? Yes; Fluid consistency: Thin  Diet effective now                 DVT prophylaxis: enoxaparin (LOVENOX) injection 40 mg Start: 11/21/20 1000   Antimicrobials:  Currently none Fluid: None Consultants: None Family Communication:  None at bedside  Status is: Inpatient  Remains inpatient appropriate  because -continues to require high level oxygen  Dispo: The patient is from: Home              Anticipated d/c is to: Home with PT              Anticipated d/c date is: 1 day              Patient currently is not medically stable to d/c.   Difficult to place patient No       Infusions:  . sodium chloride Stopped (11/22/20 1309)    Scheduled Meds: . benzonatate  200 mg Oral TID  . enoxaparin (LOVENOX) injection  40 mg Subcutaneous Q24H  . furosemide  40 mg Intravenous Once  . insulin aspart  0-9 Units Subcutaneous TID WC  .  metoprolol succinate  12.5 mg Oral Daily  . [START ON 12/04/2020] predniSONE  40 mg Oral Q breakfast  . sodium chloride flush  10-40 mL Intracatheter Q12H    Antimicrobials: Anti-infectives (From admission, onward)   Start     Dose/Rate Route Frequency Ordered Stop   11/22/20 1000  remdesivir 100 mg in sodium chloride 0.9 % 100 mL IVPB        100 mg 200 mL/hr over 30 Minutes Intravenous Daily 11/20/20 2320 11/25/20 0926   11/21/20 0000  remdesivir 100 mg in sodium chloride 0.9 % 100 mL IVPB        100 mg 200 mL/hr over 30 Minutes Intravenous Every 30 min 11/20/20 2320 11/21/20 0140      PRN meds: sodium chloride, acetaminophen, gabapentin, ondansetron **OR** ondansetron (ZOFRAN) IV, sodium chloride flush   Objective: Vitals:   12/03/20 1124 12/03/20 1334  BP:  127/68  Pulse: 69 100  Resp:  14  Temp:  98 F (36.7 C)  SpO2:  (!) 80%    Intake/Output Summary (Last 24 hours) at 12/03/2020 1335 Last data filed at 12/02/2020 1813 Gross per 24 hour  Intake 240 ml  Output -  Net 240 ml   Filed Weights   12/01/20 0500 12/02/20 0557 12/03/20 0506  Weight: 82 kg 80.9 kg 81.5 kg   Weight change: 0.6 kg Body mass index is 29.9 kg/m.   Physical Exam: General exam: [name]. Skin: No rashes, lesions or ulcers. HEENT: Atraumatic, normocephalic, no obvious bleeding Lungs: Clear to auscultation bilaterally. CVS: Regular rate and rhythm, no murmur GI/Abd soft, nontender, nondistended, bowel sound present CNS: Alert, awake, oriented to place Psychiatry: Mood appropriate Extremities: 1+ pedal edema bilaterally  Data Review: I have personally reviewed the laboratory data and studies available.  Recent Labs  Lab 11/27/20 0404 11/28/20 0434 11/30/20 0358 12/01/20 0445 12/02/20 0547 12/03/20 0621  WBC 10.6* 9.3 7.1 9.9 11.3* 12.0*  NEUTROABS 8.9* 7.9*  --   --   --   --   HGB 15.3* 16.2* 14.6 14.1 15.1* 15.6*  HCT 47.5* 49.2* 45.6 44.3 46.7* 49.2*  MCV  86.4 84.5 87.2 87.5 87.1 88.3  PLT 303 393 328 319 323 318   Recent Labs  Lab 11/27/20 0404 11/28/20 0434 11/30/20 0358 12/01/20 0445 12/02/20 0547 12/03/20 0621  NA 138 136 138 133* 140 138  K 4.5 4.3 4.5 4.7 4.2 4.1  CL 101 99 100 100 99 96*  CO2 26 26 27 26 29 28   GLUCOSE 181* 179* 183* 168* 108* 113*  BUN 27* 33* 37* 41* 43* 44*  CREATININE 0.82 0.97 0.90 0.89 0.93 1.00  CALCIUM 8.1* 8.1* 7.8* 7.8*  8.3* 8.1*  MG 2.5* 2.4  --   --   --   --   PHOS 3.7 4.1  --   --   --   --     F/u labs ordered  Signed, Terrilee Croak, MD [facility] 12/03/2020

## 2020-12-03 NOTE — Progress Notes (Signed)
SATURATION QUALIFICATIONS: (This note is used to comply with regulatory documentation for home oxygen)  Patient Saturations on Room Air at Rest = 84%  Patient Saturations on Room Air while Ambulating = 80%  Patient Saturations on 6 Liters of oxygen while Ambulating = 86%  Please briefly explain why patient needs home oxygen: patient requires oxygen to mainain O2 saturations at an acceptable level

## 2020-12-03 NOTE — Plan of Care (Signed)
  Problem: Respiratory: Goal: Complications related to the disease process, condition or treatment will be avoided or minimized Outcome: Progressing   Problem: Skin Integrity: Goal: Risk for impaired skin integrity will decrease Outcome: Progressing   Problem: Activity: Goal: Risk for activity intolerance will decrease Outcome: Progressing   Problem: Skin Integrity: Goal: Risk for impaired skin integrity will decrease Outcome: Progressing

## 2020-12-04 DIAGNOSIS — U071 COVID-19: Secondary | ICD-10-CM | POA: Diagnosis not present

## 2020-12-04 DIAGNOSIS — J9601 Acute respiratory failure with hypoxia: Secondary | ICD-10-CM | POA: Diagnosis not present

## 2020-12-04 LAB — CBC
HCT: 49 % — ABNORMAL HIGH (ref 36.0–46.0)
Hemoglobin: 15.7 g/dL — ABNORMAL HIGH (ref 12.0–15.0)
MCH: 28.5 pg (ref 26.0–34.0)
MCHC: 32 g/dL (ref 30.0–36.0)
MCV: 88.9 fL (ref 80.0–100.0)
Platelets: 319 10*3/uL (ref 150–400)
RBC: 5.51 MIL/uL — ABNORMAL HIGH (ref 3.87–5.11)
RDW: 12.7 % (ref 11.5–15.5)
WBC: 11.9 10*3/uL — ABNORMAL HIGH (ref 4.0–10.5)
nRBC: 0 % (ref 0.0–0.2)

## 2020-12-04 LAB — GLUCOSE, CAPILLARY: Glucose-Capillary: 109 mg/dL — ABNORMAL HIGH (ref 70–99)

## 2020-12-04 LAB — BASIC METABOLIC PANEL
Anion gap: 13 (ref 5–15)
BUN: 46 mg/dL — ABNORMAL HIGH (ref 8–23)
CO2: 31 mmol/L (ref 22–32)
Calcium: 8.2 mg/dL — ABNORMAL LOW (ref 8.9–10.3)
Chloride: 96 mmol/L — ABNORMAL LOW (ref 98–111)
Creatinine, Ser: 1.03 mg/dL — ABNORMAL HIGH (ref 0.44–1.00)
GFR, Estimated: 52 mL/min — ABNORMAL LOW (ref >60)
Glucose, Bld: 137 mg/dL — ABNORMAL HIGH (ref 70–99)
Potassium: 4.1 mmol/L (ref 3.5–5.1)
Sodium: 140 mmol/L (ref 135–145)

## 2020-12-04 MED ORDER — METOPROLOL SUCCINATE ER 25 MG PO TB24: 12.5000 mg | ORAL_TABLET | Freq: Every day | ORAL | 0 refills | Status: AC

## 2020-12-04 MED ORDER — BENZONATATE 200 MG PO CAPS: 200.0000 mg | ORAL_CAPSULE | Freq: Three times a day (TID) | ORAL | 0 refills | Status: AC | PRN

## 2020-12-04 NOTE — Discharge Summary (Signed)
Physician Discharge Summary  Brooke Mora HKV:[id number] DOB: 12/26/1928 DOA: 11/20/2020  PCP: Brooke Mccreedy, MD  Admit date: 11/20/2020 Discharge date: 12/04/2020  Admitted From: Home Discharge disposition: Home with home health PT/oxygen   Code Status: Full Code  Diet Recommendation: Cardiac/diabetic diet Discharge Diagnosis:   Principal Problem:   Acute hypoxemic respiratory failure due to COVID-19 [facility]) Active Problems:   Cavitary lesion of lung   HTN (hypertension)  Chief Complaint  Patient presents with  . Hypoxic   Brief narrative: Brooke Mora is a 87 y.o. female with PMH significant for COPD/emphysema, HTN, prior MAI off of treatment since early 2018 (saw Brooke Mora). Patient presented to the ED on 1/25 with shortness of breath.  She was admitted and treated for Covid pneumonia.  Despite adequate/appropriate treatment, her oxygen demand was persistent.  She was placed on a long course of oral steroids. Gradually improving now.  Subjective: Patient was seen and examined this morning.  [name].  Sitting up in chair.  Not in distress. She is not in respiratory distress.  He is on 2 L oxygen by nasal cannula.  Ambulatory oxygen requirement was checked yesterday.  Patient is comfortable to go home today.  Discussed with her daughter as well.  Hospital course: COVID pneumonia Acute respiratory failure with hypoxia  -Presented with hypoxia -COVID test: PCR positive on admission -Chest imaging: CT chest on admission with extensive bilateral groundglass pulmonary infiltrates  -Treatment: Completed 5-day course of IV remdesivir.  Despite appropriate treatment for Covid pneumonia, for several days, patient continued to require high level of oxygen.  With 2 doses of IV Lasix, she diuresed appropriately and her oxygen requirement has improved.  1+ pedal edema improved as well. Last echo from 2020 with normal EF. -Continue supplemental oxygen at  home. -Plan to discharge home today on supplemental oxygen.  Recent Labs  Lab 11/28/20 0434 11/29/20 0337 11/30/20 0358 12/01/20 0445 12/02/20 0547 12/03/20 0621 12/04/20 0537  WBC 9.3  --  7.1 9.9 11.3* 12.0* 11.9*  PROCALCITON <0.10 <0.10  --   --   --   --   --   DDIMER 1.09*  --  0.98* 0.84* 1.03* 0.97*  --   FERRITIN 242  --   --   --   --   --   --   CRP <0.5 <0.5  --   --   --   --   --   ALT 21  --   --   --   --   --   --    Prediabetes Steroid-induced hyperglycemia -A1c 6.3 on 1/30 -Currently diabetic diet.  Blood sugar level in normal range.  Not on scheduled insulin.  Hx MAI Off treatment since 2018.  Was following up with ID Dr. Megan Mora.  OSA CPAP nightly  Essential hypertension -Continue Toprol at a reduced dose of 12.5 mg daily. Blood pressure controlled on reduced dose.  Generalized weakness -PT eval obtained.  Home health PT was recommended.  Stable for discharge home today with home health PT and O2   Wound care: Incision (Closed) 07/18/14 Abdomen Other (Comment) (Active)  Date First Assessed/Time First Assessed: 07/18/14 1152   Location: Abdomen  Location Orientation: Other (Comment)    Assessments 07/18/2014 12:05 PM 07/19/2014  8:00 AM  Dressing Type - Gauze (Comment)  Dressing Clean;Dry;Intact Clean;Dry;Intact  Site / Wound Assessment - Dressing in place / Unable to assess     No Linked orders to display  Discharge Exam:   Vitals:   12/03/20 1334 12/03/20 1400 12/03/20 2033 12/04/20 0454  BP: 127/68  132/70 111/85  Pulse: 100  76 69  Resp: 14  20 18   Temp: 98 F (36.7 C)  97.7 F (36.5 C) 97.8 F (36.6 C)  TempSrc: Oral     SpO2: (!) 80% 90% 92% 94%  Weight:      Height:        Body mass index is 29.9 kg/m.  General exam: [name].  Sitting up in chair.  Not in distress on low-flow oxygen Skin: No rashes, lesions or ulcers. HEENT: Atraumatic, normocephalic, no obvious bleeding Lungs: Clear to  auscultation bilaterally CVS: Regular rate and rhythm, no murmur GI/Abd soft, nontender, nondistended, bowel sound present CNS: Alert, awake, oriented x3 Psychiatry: Mood appropriate Extremities: Improved pedal edema bilaterally, no calf tenderness  Follow ups:   Discharge Instructions    Diet - low sodium heart healthy   Complete by: As directed    Increase activity slowly   Complete by: As directed       Follow-up Information    Brooke Mccreedy, MD Follow up.   Specialty: Internal Medicine Contact information: [address] [place] [address] [phone]        Brooke Blanks, MD .   Specialty: Cardiology Contact information: [address] [place] [address] [phone]               Recommendations for Outpatient Follow-Up:   1. Follow-up with PCP as an outpatient  Discharge Instructions:  Follow with Primary MD Brooke Mccreedy, MD in 7 days   Get CBC/BMP checked in next visit within 1 week by PCP or SNF MD ( we routinely change or add medications that can affect your baseline labs and fluid status, therefore we recommend that you get the mentioned basic workup next visit with your PCP, your PCP may decide not to get them or add new tests based on their clinical decision)  On your next visit with your PCP, please Get Medicines reviewed and adjusted.  Please request your PCP  to go over all Hospital Tests and Procedure/Radiological results at the follow up, please get all Hospital records sent to your Prim MD by signing hospital release before you go home.  Activity: As tolerated with Full fall precautions use walker/cane & assistance as needed  For Heart failure patients - Check your Weight same time everyday, if you gain over 2 pounds, or you develop in leg swelling, experience more shortness of breath or chest pain, call your Primary MD immediately. Follow Cardiac Low Salt Diet and 1.5 lit/day fluid  restriction.  If you have smoked or chewed Tobacco in the last 2 yrs please stop smoking, stop any regular Alcohol  and or any Recreational drug use.  If you experience worsening of your admission symptoms, develop shortness of breath, life threatening emergency, suicidal or homicidal thoughts you must seek medical attention immediately by calling 911 or calling your MD immediately  if symptoms less severe.  You Must read complete instructions/literature along with all the possible adverse reactions/side effects for all the Medicines you take and that have been prescribed to you. Take any new Medicines after you have completely understood and accpet all the possible adverse reactions/side effects.   Do not drive, operate heavy machinery, perform activities at heights, swimming or participation in water activities or provide baby sitting services if your were admitted for  syncope or siezures until you have seen by Primary MD or a Neurologist and advised to do so again.  Do not drive when taking Pain medications.  Do not take more than prescribed Pain, Sleep and Anxiety Medications  Wear Seat belts while driving.   Please note You were cared for by a hospitalist during your hospital stay. If you have any questions about your discharge medications or the care you received while you were in the hospital after you are discharged, you can call the unit and asked to speak with the hospitalist on call if the hospitalist that took care of you is not available. Once you are discharged, your primary care physician will handle any further medical issues. Please note that NO REFILLS for any discharge medications will be authorized once you are discharged, as it is imperative that you return to your primary care physician (or establish a relationship with a primary care physician if you do not have one) for your aftercare needs so that they can reassess your need for medications and monitor your lab  values.    Allergies as of 12/04/2020      Reactions   Nabumetone Itching, Swelling      Medication List    STOP taking these medications   ibuprofen 400 MG tablet Commonly known as: ADVIL     TAKE these medications   acetaminophen 500 MG tablet Commonly known as: TYLENOL Take 1,000 mg by mouth every 6 (six) hours as needed for moderate pain.   benzonatate 200 MG capsule Commonly known as: TESSALON Take 1 capsule (200 mg total) by mouth 3 (three) times daily as needed for up to 14 days for cough.   gabapentin 300 MG capsule Commonly known as: NEURONTIN Take 300 mg by mouth daily as needed (pain).   metoprolol succinate 25 MG 24 hr tablet Commonly known as: TOPROL-XL Take 0.5 tablets (12.5 mg total) by mouth daily. What changed:   medication strength  how much to take  additional instructions            Durable Medical Equipment  (From admission, onward)         Start     Ordered   12/04/20 0734  For home use only DME Tub bench  Once        12/04/20 0733   12/04/20 0734  For home use only DME oxygen  Once       Question Answer Comment  Length of Need Lifetime   Mode or (Route) Nasal cannula   Liters per Minute 2   Frequency Continuous (stationary and portable oxygen unit needed)   Oxygen conserving device Yes   Oxygen delivery system Gas      12/04/20 0733   12/04/20 0733  For home use only DME Walker rolling  Once       Question Answer Comment  Walker: With Crystal   Patient needs a walker to treat with the following condition Impaired mobility      12/04/20 0733          Time coordinating discharge: 35 minutes  The results of significant diagnostics from this hospitalization (including imaging, microbiology, ancillary and laboratory) are listed below for reference.    Procedures and Diagnostic Studies:   CT Angio Chest PE W and/or Wo Contrast  Result Date: 11/20/2020 CLINICAL DATA:  Elevated D-dimer, COVID exposure, fatigue EXAM:  CT ANGIOGRAPHY CHEST WITH CONTRAST TECHNIQUE: Multidetector CT imaging of the chest was performed using the standard  protocol during bolus administration of intravenous contrast. Multiplanar CT image reconstructions and MIPs were obtained to evaluate the vascular anatomy. CONTRAST:  168mL OMNIPAQUE IOHEXOL 350 MG/ML SOLN COMPARISON:  04/27/2020 FINDINGS: Cardiovascular: There is adequate opacification of the pulmonary arterial tree. No intraluminal filling defect identified to suggest acute pulmonary embolism. The central pulmonary arteries are of normal caliber. Mild coronary artery calcification. Global cardiac size within normal limits. No pericardial effusion. Minimal atherosclerotic calcification within the thoracic aorta. No aortic aneurysm. Mediastinum/Nodes: 17 mm cyst within the left thyroid gland. Shotty mediastinal and left hilar adenopathy may be reactive in nature. No frankly pathologic thoracic adenopathy. The esophagus is unremarkable. Small hiatal hernia present. Lungs/Pleura: Mild biapical paraseptal emphysema again noted. Areas of tumefactive scarring within the lung apices bilaterally are unchanged. There is diffuse, peripheral ground-glass pulmonary infiltrate now present, most in keeping with atypical infection or inflammation and compatible with the given history of acute COVID pneumonia. No pneumothorax or pleural effusion. The central airways are widely patent. Upper Abdomen: Left hepatic lobe cyst is partially visualized. Atheromatous plaque is seen within the thoracoabdominal aorta at the diaphragmatic hiatus. No acute abnormality identified. Musculoskeletal: No lytic or blastic bone lesions are seen. Review of the MIP images confirms the above findings. IMPRESSION: No pulmonary embolism. Interval development of extensive bilateral ground-glass pulmonary infiltrates, most in keeping with atypical infection or inflammation and compatible with the given history of COVID-19 pneumonia.  Extensive parenchymal involvement. Mild emphysema. Stable changes of a tumefactive scarring at the lung apices bilaterally. Aortic Atherosclerosis (ICD10-I70.0). Electronically Signed   By: Fidela Salisbury MD   On: 11/20/2020 23:39   DG Chest Portable 1 View  Result Date: 11/20/2020 CLINICAL DATA:  Hypoxia. EXAM: PORTABLE CHEST 1 VIEW COMPARISON:  Chest radiograph dated 12/07/2017 and CT dated 04/27/2020. FINDINGS: Chronic bilateral upper lobe densities as seen on the prior radiograph and CT. No new consolidation. There is no pleural effusion pneumothorax. The cardiac silhouette is within limits. Atherosclerotic calcification of the aorta. Degenerative changes of the shoulders. No acute osseous pathology. IMPRESSION: No acute cardiopulmonary process. Electronically Signed   By: Anner Crete M.D.   On: 11/20/2020 22:01     Labs:   Basic Metabolic Panel: Recent Labs  Lab 11/28/20 0434 11/30/20 0358 12/01/20 0445 12/02/20 0547 12/03/20 0621 12/04/20 0537  NA 136 138 133* 140 138 140  K 4.3 4.5 4.7 4.2 4.1 4.1  CL 99 100 100 99 96* 96*  CO2 26 27 26 29 28 31   GLUCOSE 179* 183* 168* 108* 113* 137*  BUN 33* 37* 41* 43* 44* 46*  CREATININE 0.97 0.90 0.89 0.93 1.00 1.03*  CALCIUM 8.1* 7.8* 7.8* 8.3* 8.1* 8.2*  MG 2.4  --   --   --   --   --   PHOS 4.1  --   --   --   --   --    GFR Estimated Creatinine Clearance: 39 mL/min (A) (by C-G formula based on SCr of 1.03 mg/dL (H)). Liver Function Tests: Recent Labs  Lab 11/28/20 0434  AST 21  ALT 21  ALKPHOS 56  BILITOT 0.8  PROT 6.5  ALBUMIN 3.3*   No results for input(s): LIPASE, AMYLASE in the last 168 hours. No results for input(s): AMMONIA in the last 168 hours. Coagulation profile No results for input(s): INR, PROTIME in the last 168 hours.  CBC: Recent Labs  Lab 11/28/20 0434 11/30/20 0358 12/01/20 0445 12/02/20 0547 12/03/20 0621 12/04/20 0537  WBC 9.3 7.1  9.9 11.3* 12.0* 11.9*  NEUTROABS 7.9*  --   --   --   --    --   HGB 16.2* 14.6 14.1 15.1* 15.6* 15.7*  HCT 49.2* 45.6 44.3 46.7* 49.2* 49.0*  MCV 84.5 87.2 87.5 87.1 88.3 88.9  PLT 393 328 319 323 318 319   Cardiac Enzymes: No results for input(s): CKTOTAL, CKMB, CKMBINDEX, TROPONINI in the last 168 hours. BNP: Invalid input(s): POCBNP CBG: Recent Labs  Lab 12/03/20 0806 12/03/20 1138 12/03/20 1633 12/03/20 2029 12/04/20 0809  GLUCAP 87 99 187* 205* 109*   D-Dimer Recent Labs    12/02/20 0547 12/03/20 0621  DDIMER 1.03* 0.97*   Hgb A1c No results for input(s): HGBA1C in the last 72 hours. Lipid Profile No results for input(s): CHOL, HDL, LDLCALC, TRIG, CHOLHDL, LDLDIRECT in the last 72 hours. Thyroid function studies No results for input(s): TSH, T4TOTAL, T3FREE, THYROIDAB in the last 72 hours.  Invalid input(s): FREET3 Anemia work up No results for input(s): VITAMINB12, FOLATE, FERRITIN, TIBC, IRON, RETICCTPCT in the last 72 hours. Microbiology No results found for this or any previous visit (from the past 240 hour(s)).   Signed: Terrilee Croak  [facility] 12/04/2020, 9:45 AM

## 2020-12-04 NOTE — Plan of Care (Signed)
  Problem: Education: Goal: Knowledge of risk factors and measures for prevention of condition will improve Outcome: Adequate for Discharge   Problem: Respiratory: Goal: Complications related to the disease process, condition or treatment will be avoided or minimized Outcome: Adequate for Discharge   Problem: Education: Goal: Knowledge of General Education information will improve Description: Including pain rating scale, medication(s)/side effects and non-pharmacologic comfort measures Outcome: Adequate for Discharge   Problem: Health Behavior/Discharge Planning: Goal: Ability to manage health-related needs will improve Outcome: Adequate for Discharge   Problem: Clinical Measurements: Goal: Ability to maintain clinical measurements within normal limits will improve Outcome: Adequate for Discharge Goal: Will remain free from infection Outcome: Adequate for Discharge Goal: Diagnostic test results will improve Outcome: Adequate for Discharge   Problem: Activity: Goal: Risk for activity intolerance will decrease Outcome: Adequate for Discharge   Problem: Nutrition: Goal: Adequate nutrition will be maintained Outcome: Adequate for Discharge   Problem: Elimination: Goal: Will not experience complications related to bowel motility Outcome: Adequate for Discharge   Problem: Pain Managment: Goal: General experience of comfort will improve Outcome: Adequate for Discharge   Problem: Safety: Goal: Ability to remain free from injury will improve Outcome: Adequate for Discharge   Problem: Skin Integrity: Goal: Risk for impaired skin integrity will decrease Outcome: Adequate for Discharge

## 2020-12-04 NOTE — Discharge Instructions (Signed)
10 cosas que puede hacer para controlar los sntomas de COVID-19 en casa 10 Things You Can Do to Manage Your COVID-19 Symptoms at [place] infeccin por COVID-19 posible o confirmada: 1. Qudese en su casa, excepto para obtener atencin mdica. 2. Preste atencin a sus sntomas cuidadosamente. Si sus sntomas empeoran, llame al mdico de inmediato. 3. Descanse y mantngase hidratado. 4. Si tiene una cita mdica, llame al mdico con anticipacin e infrmele que tiene o puede tener COVID-19. 5. Si tiene una emergencia mdica, llame al 911 y avise al personal de despacho que tiene o puede tener COVID-19. 6. Al toser y al estornudar, cbrase la boca y la nariz con un pauelo descartable o con el pliegue del codo. 7. Lvese las manos con frecuencia con agua y jabn durante al menos 29 segundos, o bien lmpiese las manos con un desinfectante de manos a base de alcohol que contenga al menos un 60% de alcohol. 8. En la mayor medida posible, permanezca en una habitacin especfica y lejos de Psychologist, sport and exercise. Adems, debe utilizar un bao aparte, si es posible. Si necesita estar cerca de [employer] dentro o fuera de la casa, use Geographical information systems officer. 9. Evite compartir objetos personales con [employer] de la casa, como platos, toallas y ropa de [place]. 10. Limpie todas las superficies que se tocan con frecuencia, como encimeras, mesas y picaportes. Utilice los [employer] o las toallitas hmedas de limpieza domstica segn las instrucciones de la etiqueta. [email or web address] 04/27/2019 Esta informacin no tiene Marine scientist el consejo del mdico. Asegrese de hacerle al mdico cualquier pregunta que tenga. Document Revised: 08/27/2020 Document Reviewed: 08/27/2020 Elsevier Patient Education  2021 [place] respiratoria aguda en los adultos Acute Respiratory Failure, Adult La insuficiencia respiratoria aguda es una afeccin que constituye una emergencia  mdica. Puede desarrollarse rpidamente y debe tratarse de inmediato. Hay dos tipos de insuficiencia respiratoria aguda:  La insuficiencia respiratoria de tipo I ocurre cuando los pulmones no pueden enviar suficiente oxgeno a Herbalist. Esto hace que el nivel de oxgeno en la sangre disminuya.  La insuficiencia respiratoria de tipo II se produce cuando el dixido de carbono no sale de los [employer] fuera del cuerpo. Esto hace que el dixido de carbono se acumule en la sangre. Una persona puede tener un tipo de insuficiencia respiratoria aguda o ambos tipos al [employer]. Cules son las causas? Las causas frecuentes de la insuficiencia respiratoria de tipo I incluyen lo siguiente:  Traumatismo en el pulmn, el trax, las costillas o los tejidos que rodean el pulmn.  Neumona.  Enfermedades pulmonares, como fibrosis pulmonar o asma.  Inhalacin de humo, sustancias qumicas o agua.  Un cogulo de [employer] pulmones (embolia pulmonar).  Una infeccin en la sangre (sepsis).  Infarto de miocardio. Las causas frecuentes de la insuficiencia respiratoria de tipo II incluyen lo siguiente:  Accidente cerebrovascular.  Lesin en la mdula espinal.  Sobredosis de drogas o alcohol.  Una infeccin en la sangre (sepsis).  Paro cardacoSander Nephew incrementa el riesgo? Es ms probable que Orthoptist en las personas que tienen:  Enfermedades pulmonares, como asma o enfermedad pulmonar obstructiva crnica (EPOC).  Una afeccin que daa o [employer], los nervios, los huesos o los tejidos que intervienen en la respiracin, como miastenia grave o el sndrome de Curator.  Una infeccin grave.  Un problema de salud que bloquea el reflejo inconsciente que est involucrado en la respiracin,  como el hipotiroidismo o la apnea del sueo. Cules son los signos o sntomas? La dificultad para respirar es el sntoma principal de la insuficiencia respiratoria  aguda. Otros sntomas son:  Respiracin acelerada.  Ansiedad o inquietud.  Respiracin ruidosa (sibilancias) y sonidos similares a gruidos.  Latidos cardacos acelerados o irregulares (palpitaciones).  Confusin o cambios en la conducta.  Cansancio (fatiga), dormir ms de lo normal o tener dificultad para despertarse.  Piel, labios o yemas de los dedos de color azulado (cianosis). Cmo se diagnostica? Esta afeccin se puede diagnosticar en funcin de lo siguiente:  Los antecedentes mdicos y un examen fsico. El mdico le auscultar el corazn y los pulmones para determinar si presenta sonidos anormales.  Pruebas para confirmar el diagnstico y determinar qu est produciendo la insuficiencia respiratoria. Estas pruebas pueden incluir lo siguiente: ? Medicin de la cantidad de oxgeno en la sangre (oximetra de pulso). La medicin se obtiene con un dispositivo pequeo que se coloca en el dedo, el lbulo o un dedo del pie. ? Anlisis de sangre para medir el oxgeno y el dixido de carbono en la sangre y para buscar signos de infeccin. ? Anlisis de [place] del lquido que rodea la mdula espinal (lquido cefalorraqudeo) o una muestra de lquido que se extrae de la trquea para detectar infecciones. ? Radiografa de trax. ? Electrocardiograma (ECG) para controlar la actividad elctrica del corazn.   Cmo se trata? El tratamiento para esta afeccin suele realizarse en la unidad de cuidados intensivos (UCI) de un hospital. El tratamiento depende de la causa. Puede incluir uno o ms de los siguientes tratamientos:  Es posible que le administren oxgeno a travs de una mascarilla o por la Lawyer.  Es posible que se use un dispositivo tal como una mquina de presin positiva continua (CPAP) o de presin positiva de dos niveles (BPAP) de las vas areas para ayudarle a Ambulance person. El dispositivo le administra oxgeno y presin.  Es posible que le administren tratamientos  respiratorios, lquidos y otros medicamentos.  Es posible que se utilice un respirador para ayudarle a Ambulance person. La [employer] oxgeno y presin. El respirador se conecta a un tubo que se coloca en la boca y la trquea. ? Si este tratamiento es necesario a ms [employer], se puede Engineer, materials. Una traqueostoma es la colocacin de un tubo para respirar a travs del cuello en la trquea.  En casos extremos, se puede usar la oxigenacin por membrana extracorprea [facility]). Este tratamiento asume la funcin del corazn y los pulmones, de forma temporaria, para suministrar oxgeno y Radiographer, therapeutic el dixido de carbono. A travs de la OMEC, los pulmones tienen la posibilidad de recuperarse. Siga estas instrucciones en su casa: Medicamentos  Use los medicamentos de venta libre y los recetados solamente como se lo haya indicado el mdico.  Si le recetaron un antibitico, tmelo como se lo haya indicado el mdico. No deje de usar el antibitico aunque comience a Sports administrator.  Si est tomando anticoagulantes, tenga en cuenta lo siguiente: ? Hable con el mdico antes de tomar cualquier medicamento que contenga aspirina o antiinflamatorios no esteroideos (AINE), como el ibuprofeno. Estos medicamentos aumentan el riesgo de tener una hemorragia peligrosa. ? Tome los P[employer] Corporation se lo indicaron, [employer] a la misma hora. ? Evite las actividades que podran causarle lesiones o moretones y [place] cadas. ? Use un brazalete de alerta mdica o lleve una tarjeta con Tes[employer] Corporation de  los medicamentos que toma. Instrucciones generales  Retome sus actividades normales segn lo indicado por el mdico. Pregntele al mdico qu actividades son seguras para usted.  No consuma ningn producto que contenga nicotina o tabaco, como cigarrillos, cigarrillos electrnicos y tabaco de Higher education careers adviser. Si necesita ayuda para dejar de fumar, consulte al  mdico.  No beba alcohol si: ? Su mdico le indica no hacerlo. ? Est embarazada, puede estar embarazada o est tratando de quedar embarazada.  Use medias de compresin como se lo haya indicado su mdico. Estas medias ayudan a evitar la formacin de cogulos de [place] y a reducir la hinchazn de las piernas.  Asista a fisioterapia y rehabilitacin pulmonar tal como se lo haya indicado el mdico.  Concurra a todas las visitas de seguimiento como se lo haya indicado el mdico. Esto es importante. Cmo se previene?  Si tiene una infeccin o una enfermedad que puede causar insuficiencia respiratoria aguda, asegrese de recibir el tratamiento adecuado. Comunquese con un mdico si:  Tiene fiebre.  Los sntomas no mejoran o empeoran. Solicite ayuda de inmediato si:  Presenta dificultades respiratorias sbitas.  Pierde la conciencia.  Se le acelera la frecuencia cardaca.  [place] manos, los labios u otras zonas del cuerpo se le ponen de color [place].  Se siente confundido. Estos sntomas pueden representar un problema grave que constituye Engineer, maintenance (IT). No espere a ver si los sntomas desaparecen. Solicite atencin mdica de inmediato. Comunquese con el servicio de emergencias de su localidad (911 en los [place]). No conduzca por sus propios medios [employer] hospital. Resumen  La insuficiencia respiratoria aguda es una emergencia mdica. Puede desarrollarse rpidamente y debe tratarse de inmediato.  El tratamiento para esta afeccin suele realizarse en la unidad de cuidados intensivos ([place]) de un hospital. El tratamiento de emergencia puede incluir oxgeno, lquidos y Chief Strategy Officer. Es posible que se utilice un dispositivo para ayudarle a Ambulance person, Psychologist, educational.  Use los medicamentos de venta libre y los recetados solamente como se lo haya indicado el mdico.  Comunquese con un mdico si los sntomas no mejoran o si empeoran. Esta informacin no tiene Buyer, retail el consejo del mdico. Asegrese de hacerle al mdico cualquier pregunta que tenga. Document Revised: 11/21/2019 Document Reviewed: 11/21/2019 Elsevier Patient Education  2021 [employer].

## 2020-12-04 NOTE — TOC Transition Note (Signed)
Transition of Care [facility]) - CM/SW Discharge Note   Patient Details  Name: LAWRIE TUNKS MRN: [id number] Date of Birth: 27-Mar-1929  Transition of [facility] CM/SW Contact:  Trish Mage, LCSW Phone Number: 12/04/2020, 10:27 AM   Clinical Narrative:   Patient who is stable for discharge is in need of [place] [facility], home O2, and DME. SAT note and orders seen and appreciated.  Cindie with [facility] agreed to provide [facility] services.  Caryl Pina with [employer] will arrange for delivery of travel canister and home O2 unit.  [facility] will deliver RW, tub bench to room. Granddaughter will provide transportation home, No further needs identified.  TOC sign off.    Final next level of care: [place] Barriers to Discharge: No Barriers Identified   Patient Goals and CMS Choice        Discharge Placement                       Discharge Plan and Services                                     Social Determinants of Health (SDOH) Interventions     Readmission Risk Interventions No flowsheet data found.

## 2021-04-30 ENCOUNTER — Other Ambulatory Visit: Payer: Self-pay | Admitting: Cardiovascular Disease

## 2021-12-04 ENCOUNTER — Ambulatory Visit: Payer: Medicaid Other | Admitting: Pulmonary Disease

## 2021-12-04 ENCOUNTER — Other Ambulatory Visit: Payer: Self-pay

## 2021-12-04 ENCOUNTER — Encounter: Payer: Self-pay | Admitting: Pulmonary Disease

## 2021-12-04 VITALS — BP 164/82 | HR 75 | Temp 98.2°F | Ht 64.0 in | Wt 186.0 lb

## 2021-12-04 DIAGNOSIS — R918 Other nonspecific abnormal finding of lung field: Secondary | ICD-10-CM | POA: Diagnosis not present

## 2021-12-04 DIAGNOSIS — Z87891 Personal history of nicotine dependence: Secondary | ICD-10-CM | POA: Diagnosis not present

## 2021-12-04 NOTE — Patient Instructions (Signed)
Thank you for visiting Dr. Valeta Harms at [facility]. Today we recommend the following:  Orders Placed This Encounter  Procedures   CT Super D Chest Wo Contrast   Follow up with [place] after her CT Chest is complete.   Return in about 3 weeks (around 12/25/2021) for with Eric Form, NP, or Dr. Valeta Harms.    Please do your part to reduce the spread of COVID-19.

## 2021-12-04 NOTE — Progress Notes (Signed)
Synopsis: Referred in Feb 2023 for lung mass by Benito Mccreedy, MD  Subjective:   PATIENT ID: Brooke Mora GENDER: female DOB: [birth year], MRN: [id number]  Chief Complaint  Patient presents with   Follow-up    Patient is here to talk about abnormal findings from CT    This is a 88 year old female here today for evaluation of abnormal chest imaging.  Patient's granddaughter is here to help communicate and translate.  Patient does understand some English however granddaughter is here and is a Personnel officer at [employer].  The patient has been seen here in our clinic before for these biapical masses.  They have been stable for some time however recent chest X imaging that was completed at primary care office had concern for progression.  Patient had apical cavitary disease in the right side dating back since 2017.  Patient had a bronchoscopy with BAL with cultures that were negative, AFB negative.  She even had PET scan imaging which revealed hypermetabolic uptake within some of these lesions.  I see no previous biopsy that was completed.Patient's initial CT imaging was in January 2017.  Had subsequent serial CT imaging through September 2018.  Patient had a repeat CT scan of the chest in July 2021.  This revealed stable lobulated irregular biapical masses with no progression of disease and stable hilar lymph nodes.  Due to ongoing back pain patient had a repeat chest x-ray at primary care which led to the referral today.  Also in 2022 in the emergency department patient had a repeat CT scan of the chest was the time she was diagnosed with COVID-19 at extensive bilateral groundglass opacities within the lung.  These series of images have been reviewed today.  The area of scarring within the apices had been the same this entire time.  Patient is a longtime smoker she quit several years ago however smoked at least a half a pack a day for 40+ years.  Also lives with her ex-husband who was a  heavy smoker.    Past Medical History:  Diagnosis Date   COPD (chronic obstructive pulmonary disease) (HCC)    slight    Emphysema lung (HCC)    GERD (gastroesophageal reflux disease)    High cholesterol    Hypertension    patient states b/p up and down not on meds   Kidney stones    SVT (supraventricular tachycardia) ([place])      Family History  Problem Relation Age of Onset   Diabetes Mother      Past Surgical History:  Procedure Laterality Date   ABDOMINAL HYSTERECTOMY     CATARACT EXTRACTION     HERNIA REPAIR     INCISIONAL HERNIA REPAIR N/A 07/18/2014   Procedure: REPAIR OF INACERATED INCISIONAL HERNIA WITH MESH;  Surgeon: Armandina Gemma, MD;  Location: [facility];  Service: General;  Laterality: N/A;   INSERTION OF MESH N/A 07/18/2014   Procedure: INSERTION OF MESH;  Surgeon: Armandina Gemma, MD;  Location: [facility];  Service: General;  Laterality: N/A;   KNEE SURGERY     VIDEO BRONCHOSCOPY Bilateral 11/05/2015   Procedure: VIDEO BRONCHOSCOPY WITHOUT FLUORO;  Surgeon: Javier Glazier, MD;  Location: [facility];  Service: Cardiopulmonary;  Laterality: Bilateral;    Social History   Socioeconomic History   Marital status: Widowed    Spouse name: Not on file   Number of children: Not on file   Years of education: Not on file   Highest education level: Not  on file  Occupational History   Not on file  Tobacco Use   Smoking status: Former    Packs/day: 0.50    Years: 60.00    Pack years: 30.00    Types: Cigarettes    Quit date: 10/27/2006    Years since quitting: 15.1   Smokeless tobacco: Never  Vaping Use   Vaping Use: Never used  Substance and Sexual Activity   Alcohol use: No    Alcohol/week: 0.0 standard drinks   Drug use: No   Sexual activity: Not on file  Other Topics Concern   Not on file  Social History Narrative   Not on file   Social Determinants of Health   Financial Resource Strain: Not on file  Food Insecurity: Not on file  Transportation Needs: Not  on file  Physical Activity: Not on file  Stress: Not on file  Social Connections: Not on file  Intimate Partner Violence: Not on file     Allergies  Allergen Reactions   Nabumetone Itching and Swelling     Outpatient Medications Prior to Visit  Medication Sig Dispense Refill   acetaminophen (TYLENOL) 500 MG tablet Take 1,000 mg by mouth every 6 (six) hours as needed for moderate pain.     gabapentin (NEURONTIN) 300 MG capsule Take 300 mg by mouth daily as needed (pain).     mirtazapine (REMERON) 15 MG tablet Take 30 mg by mouth at bedtime.     omeprazole (PRILOSEC) 20 MG capsule Take 20 mg by mouth daily.     traMADol (ULTRAM) 50 MG tablet Take 50 mg by mouth every 6 (six) hours as needed. Twice daily     metoprolol succinate (TOPROL-XL) 25 MG 24 hr tablet Take 0.5 tablets (12.5 mg total) by mouth daily. 45 tablet 0   No facility-administered medications prior to visit.    Review of Systems  Constitutional:  Negative for chills, fever, malaise/fatigue and weight loss.  HENT:  Negative for hearing loss, sore throat and tinnitus.   Eyes:  Negative for blurred vision and double vision.  Respiratory:  Negative for cough, hemoptysis, sputum production, shortness of breath, wheezing and stridor.   Cardiovascular:  Negative for chest pain, palpitations, orthopnea, leg swelling and PND.  Gastrointestinal:  Negative for abdominal pain, constipation, diarrhea, heartburn, nausea and vomiting.  Genitourinary:  Negative for dysuria, hematuria and urgency.  Musculoskeletal:  Positive for back pain. Negative for joint pain and myalgias.  Skin:  Negative for itching and rash.  Neurological:  Negative for dizziness, tingling, weakness and headaches.  Endo/Heme/Allergies:  Negative for environmental allergies. Does not bruise/bleed easily.  Psychiatric/Behavioral:  Negative for depression. The patient is not nervous/anxious and does not have insomnia.   All other systems reviewed and are  negative.   Objective:  Physical Exam Vitals reviewed.  Constitutional:      General: She is not in acute distress.    Appearance: She is well-developed.  HENT:     Head: Normocephalic and atraumatic.  Eyes:     General: No scleral icterus.    Conjunctiva/sclera: Conjunctivae normal.     Pupils: Pupils are equal, round, and reactive to light.  Neck:     Vascular: No JVD.     Trachea: No tracheal deviation.  Cardiovascular:     Rate and Rhythm: Normal rate and regular rhythm.     Heart sounds: Normal heart sounds. No murmur heard. Pulmonary:     Effort: Pulmonary effort is normal. No tachypnea, accessory muscle usage  or respiratory distress.     Breath sounds: No stridor. No wheezing, rhonchi or rales.     Comments: Diminished breath sounds bilaterally Abdominal:     General: There is no distension.     Palpations: Abdomen is soft.     Tenderness: There is no abdominal tenderness.  Musculoskeletal:        General: No tenderness.     Cervical back: Neck supple.  Lymphadenopathy:     Cervical: No cervical adenopathy.  Skin:    General: Skin is warm and dry.     Capillary Refill: Capillary refill takes less than 2 seconds.     Findings: No rash.  Neurological:     Mental Status: She is alert and oriented to person, place, and time.  Psychiatric:        Behavior: Behavior normal.     Vitals:   12/04/21 1524  BP: (!) 164/82  Pulse: 75  Temp: 98.2 F (36.8 C)  TempSrc: Oral  SpO2: 92%  Weight: 186 lb (84.4 kg)  Height: 5\' 4"  (1.626 m)   92% on RA BMI Readings from Last 3 Encounters:  12/04/21 31.93 kg/m  12/03/20 29.90 kg/m  05/15/20 31.62 kg/m   Wt Readings from Last 3 Encounters:  12/04/21 186 lb (84.4 kg)  12/03/20 179 lb 10.8 oz (81.5 kg)  05/15/20 190 lb (86.2 kg)     CBC    Component Value Date/Time   WBC 11.9 (H) 12/04/2020 0537   RBC 5.51 (H) 12/04/2020 0537   HGB 15.7 (H) 12/04/2020 0537   HCT 49.0 (H) 12/04/2020 0537   PLT 319  12/04/2020 0537   MCV 88.9 12/04/2020 0537   MCH 28.5 12/04/2020 0537   MCHC 32.0 12/04/2020 0537   RDW 12.7 12/04/2020 0537   LYMPHSABS 0.8 11/28/2020 0434   MONOABS 0.4 11/28/2020 0434   EOSABS 0.0 11/28/2020 0434   BASOSABS 0.0 11/28/2020 0434    Chest Imaging: Several CT imaging from 2017 through 2022 reviewed today in the office: Has areas of scarring and apical lobulated masses within both lungs. The patient's images have been independently reviewed by me.    Pulmonary Functions Testing Results: PFT Results Latest Ref Rng & Units 12/21/2015  FVC-Pre L 2.31  FVC-Predicted Pre % 91  FVC-Post L 2.34  FVC-Predicted Post % 92  Pre FEV1/FVC % % 77  Post FEV1/FCV % % 79  FEV1-Pre L 1.78  FEV1-Predicted Pre % 94  FEV1-Post L 1.86  TLC L 10.62  TLC % Predicted % 204  RV % Predicted % 329    FeNO:   Pathology:   Echocardiogram:   Heart Catheterization:     Assessment & Plan:     ICD-10-CM   1. Multiple nodules of lung  R91.8 CT Super D Chest Wo Contrast    2. Former cigarette smoker  Z87.891       Discussion:  This is a 88 year old female, multiple pulmonary nodules, large apical areas of masslike lesions and scarring in the area.  Prior bronchoscopy in 2017 with negative cultures, AFB negative.  At 1 point time she had a cavitary area within the lung.  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  Plan: With ongoing pain in the upper back as well as changes seen in her recent chest x-ray completed at her primary care office at the next best step is to have a repeat CT scan of the chest. We need to  have a CT to compare to to see if there is any changes in the lesions.  At her age she is obviously at risk for the development of a malignancy but these lesions that she currently have have been unchanged for several years I think that is low risk.  I was able to view a picture of the chest x-ray completed at her primary  care office on her granddaughter's phone in the office.  To me it looks like it represents the same similar masses that were documented on previous CT imaging dating back since 2017.  We will have a repeat noncontrasted CT scan of the chest and they can see [place] in a few weeks after the CT scan to review results and come up with next best steps.   Current Outpatient Medications:    acetaminophen (TYLENOL) 500 MG tablet, Take 1,000 mg by mouth every 6 (six) hours as needed for moderate pain., Disp: , Rfl:    gabapentin (NEURONTIN) 300 MG capsule, Take 300 mg by mouth daily as needed (pain)., Disp: , Rfl:    mirtazapine (REMERON) 15 MG tablet, Take 30 mg by mouth at bedtime., Disp: , Rfl:    omeprazole (PRILOSEC) 20 MG capsule, Take 20 mg by mouth daily., Disp: , Rfl:    traMADol (ULTRAM) 50 MG tablet, Take 50 mg by mouth every 6 (six) hours as needed. Twice daily, Disp: , Rfl:    metoprolol succinate (TOPROL-XL) 25 MG 24 hr tablet, Take 0.5 tablets (12.5 mg total) by mouth daily., Disp: 45 tablet, Rfl: 0   Garner Nash, DO [facility] 12/04/2021 3:30 PM

## 2021-12-26 ENCOUNTER — Ambulatory Visit
Admission: RE | Admit: 2021-12-26 | Discharge: 2021-12-26 | Disposition: A | Discharge status: Home / Self Care | Payer: Medicaid Other | Source: Ambulatory Visit | Attending: Pulmonary Disease | Admitting: Pulmonary Disease

## 2021-12-26 DIAGNOSIS — R918 Other nonspecific abnormal finding of lung field: Secondary | ICD-10-CM

## 2022-01-01 ENCOUNTER — Ambulatory Visit: Payer: Medicaid Other | Admitting: Pulmonary Disease

## 2022-01-01 ENCOUNTER — Other Ambulatory Visit: Payer: Self-pay

## 2022-01-01 ENCOUNTER — Encounter: Payer: Self-pay | Admitting: Pulmonary Disease

## 2022-01-01 VITALS — BP 146/68 | HR 69 | Temp 98.1°F | Ht 64.0 in | Wt 186.6 lb

## 2022-01-01 DIAGNOSIS — C7951 Secondary malignant neoplasm of bone: Secondary | ICD-10-CM

## 2022-01-01 DIAGNOSIS — Z515 Encounter for palliative care: Secondary | ICD-10-CM

## 2022-01-01 DIAGNOSIS — R918 Other nonspecific abnormal finding of lung field: Secondary | ICD-10-CM | POA: Diagnosis not present

## 2022-01-01 MED ORDER — OXYCODONE-ACETAMINOPHEN 5-325 MG PO TABS: 1.0000 | ORAL_TABLET | Freq: Four times a day (QID) | ORAL | 0 refills | Status: DC | PRN

## 2022-01-01 NOTE — Progress Notes (Signed)
Synopsis: Referred in Feb 2023 for lung mass by Benito Mccreedy, MD  Subjective:   PATIENT ID: Brooke Mora GENDER: female DOB: Dec 12, 1928, MRN: [id number]  Chief Complaint  Patient presents with   Follow-up    Patient is here to discuss CT results.    This is a 88 year old female here today for evaluation of abnormal chest imaging.  Patient's granddaughter is here to help communicate and translate.  Patient does understand some English however granddaughter is here and is a Personnel officer at [employer].  The patient has been seen here in our clinic before for these biapical masses.  They have been stable for some time however recent chest X imaging that was completed at primary care office had concern for progression.  Patient had apical cavitary disease in the right side dating back since 2017.  Patient had a bronchoscopy with BAL with cultures that were negative, AFB negative.  She even had PET scan imaging which revealed hypermetabolic uptake within some of these lesions.  I see no previous biopsy that was completed.Patient's initial CT imaging was in January 2017.  Had subsequent serial CT imaging through September 2018.  Patient had a repeat CT scan of the chest in July 2021.  This revealed stable lobulated irregular biapical masses with no progression of disease and stable hilar lymph nodes.  Due to ongoing back pain patient had a repeat chest x-ray at primary care which led to the referral today.  Also in 2022 in the emergency department patient had a repeat CT scan of the chest was the time she was diagnosed with COVID-19 at extensive bilateral groundglass opacities within the lung.  These series of images have been reviewed today.  The area of scarring within the apices had been the same this entire time.  Patient is a longtime smoker she quit several years ago however smoked at least a half a pack a day for 40+ years.  Also lives with her ex-husband who was a heavy smoker.  OV  01/01/2022: Patient had recent CT scan of the chest and here for follow-up after that.See to chest completed on 12/26/2021 revealed significant enlargement of a right apical pleural-based mass now 5.7 cm with associated multiple parenchymal and subpleural nodules concerning for metastatic disease.  Additionally there is direct involvement of the chest wall from the right apical mass with bony destruction of the right fourth rib    Past Medical History:  Diagnosis Date   COPD (chronic obstructive pulmonary disease) (HCC)    slight    Emphysema lung (HCC)    GERD (gastroesophageal reflux disease)    High cholesterol    Hypertension    patient states b/p up and down not on meds   Kidney stones    SVT (supraventricular tachycardia) ([place])      Family History  Problem Relation Age of Onset   Diabetes Mother      Past Surgical History:  Procedure Laterality Date   ABDOMINAL HYSTERECTOMY     CATARACT EXTRACTION     HERNIA REPAIR     INCISIONAL HERNIA REPAIR N/A 07/18/2014   Procedure: REPAIR OF INACERATED INCISIONAL HERNIA WITH MESH;  Surgeon: Armandina Gemma, MD;  Location: [facility];  Service: General;  Laterality: N/A;   INSERTION OF MESH N/A 07/18/2014   Procedure: INSERTION OF MESH;  Surgeon: Armandina Gemma, MD;  Location: [facility];  Service: General;  Laterality: N/A;   KNEE SURGERY     VIDEO BRONCHOSCOPY Bilateral 11/05/2015  Procedure: VIDEO BRONCHOSCOPY WITHOUT FLUORO;  Surgeon: Javier Glazier, MD;  Location: [facility];  Service: Cardiopulmonary;  Laterality: Bilateral;    Social History   Socioeconomic History   Marital status: Widowed    Spouse name: Not on file   Number of children: Not on file   Years of education: Not on file   Highest education level: Not on file  Occupational History   Not on file  Tobacco Use   Smoking status: Former    Packs/day: 0.50    Years: 60.00    Pack years: 30.00    Types: Cigarettes    Quit date: 10/27/2006    Years since quitting: 15.1    Smokeless tobacco: Never  Vaping Use   Vaping Use: Never used  Substance and Sexual Activity   Alcohol use: No    Alcohol/week: 0.0 standard drinks   Drug use: No   Sexual activity: Not on file  Other Topics Concern   Not on file  Social History Narrative   Not on file   Social Determinants of Health   Financial Resource Strain: Not on file  Food Insecurity: Not on file  Transportation Needs: Not on file  Physical Activity: Not on file  Stress: Not on file  Social Connections: Not on file  Intimate Partner Violence: Not on file     Allergies  Allergen Reactions   Nabumetone Itching and Swelling     Outpatient Medications Prior to Visit  Medication Sig Dispense Refill   acetaminophen (TYLENOL) 500 MG tablet Take 1,000 mg by mouth every 6 (six) hours as needed for moderate pain.     gabapentin (NEURONTIN) 300 MG capsule Take 300 mg by mouth daily as needed (pain).     mirtazapine (REMERON) 15 MG tablet Take 30 mg by mouth at bedtime.     omeprazole (PRILOSEC) 20 MG capsule Take 20 mg by mouth daily.     traMADol (ULTRAM) 50 MG tablet Take 50 mg by mouth every 6 (six) hours as needed. Twice daily     metoprolol succinate (TOPROL-XL) 25 MG 24 hr tablet Take 0.5 tablets (12.5 mg total) by mouth daily. 45 tablet 0   No facility-administered medications prior to visit.    Review of Systems  Constitutional:  Negative for chills, fever, malaise/fatigue and weight loss.  HENT:  Negative for hearing loss, sore throat and tinnitus.   Eyes:  Negative for blurred vision and double vision.  Respiratory:  Negative for cough, hemoptysis, sputum production, shortness of breath, wheezing and stridor.   Cardiovascular:  Negative for chest pain, palpitations, orthopnea, leg swelling and PND.  Gastrointestinal:  Negative for abdominal pain, constipation, diarrhea, heartburn, nausea and vomiting.  Genitourinary:  Negative for dysuria, hematuria and urgency.  Musculoskeletal:  Positive for  back pain. Negative for joint pain and myalgias.  Skin:  Negative for itching and rash.  Neurological:  Negative for dizziness, tingling, weakness and headaches.  Endo/Heme/Allergies:  Negative for environmental allergies. Does not bruise/bleed easily.  Psychiatric/Behavioral:  Negative for depression. The patient is not nervous/anxious and does not have insomnia.   All other systems reviewed and are negative.   Objective:  Physical Exam Vitals reviewed.  Constitutional:      General: She is not in acute distress.    Appearance: She is well-developed.  HENT:     Head: Normocephalic and atraumatic.  Eyes:     General: No scleral icterus.    Conjunctiva/sclera: Conjunctivae normal.     Pupils: Pupils  are equal, round, and reactive to light.  Neck:     Vascular: No JVD.     Trachea: No tracheal deviation.  Cardiovascular:     Rate and Rhythm: Normal rate and regular rhythm.     Heart sounds: Normal heart sounds. No murmur heard. Pulmonary:     Effort: Pulmonary effort is normal. No tachypnea, accessory muscle usage or respiratory distress.     Breath sounds: Normal breath sounds. No stridor. No wheezing, rhonchi or rales.  Abdominal:     General: Bowel sounds are normal. There is no distension.     Palpations: Abdomen is soft.     Tenderness: There is no abdominal tenderness.  Musculoskeletal:        General: No tenderness.     Cervical back: Neck supple.  Lymphadenopathy:     Cervical: No cervical adenopathy.  Skin:    General: Skin is warm and dry.     Capillary Refill: Capillary refill takes less than 2 seconds.     Findings: No rash.  Neurological:     Mental Status: She is alert and oriented to person, place, and time.  Psychiatric:        Behavior: Behavior normal.     Vitals:   01/01/22 1036  BP: (!) 146/68  Pulse: 69  Temp: 98.1 F (36.7 C)  TempSrc: Oral  SpO2: 95%  Weight: 186 lb 9.6 oz (84.6 kg)  Height: 5\' 4"  (1.626 m)   95% on RA BMI Readings  from Last 3 Encounters:  01/01/22 32.03 kg/m  12/04/21 31.93 kg/m  12/03/20 29.90 kg/m   Wt Readings from Last 3 Encounters:  01/01/22 186 lb 9.6 oz (84.6 kg)  12/04/21 186 lb (84.4 kg)  12/03/20 179 lb 10.8 oz (81.5 kg)     CBC    Component Value Date/Time   WBC 11.9 (H) 12/04/2020 0537   RBC 5.51 (H) 12/04/2020 0537   HGB 15.7 (H) 12/04/2020 0537   HCT 49.0 (H) 12/04/2020 0537   PLT 319 12/04/2020 0537   MCV 88.9 12/04/2020 0537   MCH 28.5 12/04/2020 0537   MCHC 32.0 12/04/2020 0537   RDW 12.7 12/04/2020 0537   LYMPHSABS 0.8 11/28/2020 0434   MONOABS 0.4 11/28/2020 0434   EOSABS 0.0 11/28/2020 0434   BASOSABS 0.0 11/28/2020 0434    Chest Imaging: Several CT imaging from 2017 through 2022 reviewed today in the office: Has areas of scarring and apical lobulated masses within both lungs. The patient's images have been independently reviewed by me.   12/26/2021 CT chest: Progressive enlargement of the right apical lesion now 5.7 cm in largest cross-section with associated metastatic disease to the right chest and multiple pulmonary nodules.  This is concerning for an advanced age bronchogenic carcinoma. The patient's images have been independently reviewed by me.    Pulmonary Functions Testing Results: PFT Results Latest Ref Rng & Units 12/21/2015  FVC-Pre L 2.31  FVC-Predicted Pre % 91  FVC-Post L 2.34  FVC-Predicted Post % 92  Pre FEV1/FVC % % 77  Post FEV1/FCV % % 79  FEV1-Pre L 1.78  FEV1-Predicted Pre % 94  FEV1-Post L 1.86  TLC L 10.62  TLC % Predicted % 204  RV % Predicted % 329    FeNO:   Pathology:   Echocardiogram:   Heart Catheterization:     Assessment & Plan:     ICD-10-CM   1. Lung mass  R91.8 Amb Referral to Palliative Care    2. Hospice  care patient  Z51.5     3. Bone metastasis ([place])  C79.51        Discussion:  This is a 88 year old female, multiple pulmonary nodules, large apical masslike densities.  In 2017 she had a large  cavity within the left upper lobe.  Work-up at that time was negative.  She ultimately did have having serial CT imaging.  Additionally she had a CT scan in January 2022 which had persistence of the apical masslike densities.  The right apical lesion was more round and solid in appearance.  Recent CT imaging repeated a little over a year later shows today a much larger lesion in the right upper lobe with associated nodules throughout the lung concerning for a primary bronchogenic carcinoma with metastatic disease.  Plan: I discussed this in detail with the patient as well as the patient's granddaughter.  They were all in agreements that she would not want to consider general anesthesia, biopsy or chemotherapy and radiation at the age of 46. I did explain that radiation treatments could be an option for treatment to the bony destruction from the right upper lobe lesion.  Ambulatory referral placed to palliative care. New prescription for Percocet to help with pain.  I will reach out discussed case with our radiation oncology colleagues.  Hopefully they will be able to offer her some palliative treatments to help with bone pain.   Current Outpatient Medications:    acetaminophen (TYLENOL) 500 MG tablet, Take 1,000 mg by mouth every 6 (six) hours as needed for moderate pain., Disp: , Rfl:    gabapentin (NEURONTIN) 300 MG capsule, Take 300 mg by mouth daily as needed (pain)., Disp: , Rfl:    mirtazapine (REMERON) 15 MG tablet, Take 30 mg by mouth at bedtime., Disp: , Rfl:    omeprazole (PRILOSEC) 20 MG capsule, Take 20 mg by mouth daily., Disp: , Rfl:    oxyCODONE-acetaminophen (PERCOCET) 5-325 MG tablet, Take 1 tablet by mouth every 6 (six) hours as needed for severe pain., Disp: 60 tablet, Rfl: 0   traMADol (ULTRAM) 50 MG tablet, Take 50 mg by mouth every 6 (six) hours as needed. Twice daily, Disp: , Rfl:    metoprolol succinate (TOPROL-XL) 25 MG 24 hr tablet, Take 0.5 tablets (12.5 mg total) by  mouth daily., Disp: 45 tablet, Rfl: 0   I spent 44 minutes dedicated to the care of this patient on the date of this encounter to include pre-visit review of records, face-to-face time with the patient discussing conditions above, post visit ordering of testing, clinical documentation with the electronic health record, making appropriate referrals as documented, and communicating necessary findings to members of the patients care team.    Garner Nash, DO [facility] 01/01/2022 11:05 AM

## 2022-01-01 NOTE — Patient Instructions (Signed)
Thank you for visiting Dr. Valeta Harms at [facility]. ?Today we recommend the following: ? ?Orders Placed This Encounter  ?Procedures  ? Amb Referral to Palliative Care  ? ?Meds ordered this encounter  ?Medications  ? oxyCODONE-acetaminophen (PERCOCET) 5-325 MG tablet  ?  Sig: Take 1 tablet by mouth every 6 (six) hours as needed for severe pain.  ?  Dispense:  60 tablet  ?  Refill:  0  ? ?Return in about 3 months (around 04/03/2022), or if symptoms worsen or fail to improve. ? ? ? ?Please do your part to reduce the spread of COVID-19.  ? ?

## 2022-01-02 ENCOUNTER — Encounter: Payer: Self-pay | Admitting: Pulmonary Disease

## 2022-01-02 ENCOUNTER — Telehealth: Payer: Self-pay

## 2022-01-02 NOTE — Telephone Encounter (Signed)
Pharmacy, please advise on pt email. Pt's oxycodone isnt covered and needs PA. Thanks.  ? ? ?Triage: When responding back to pt, may need to call pt to inform her instead of replying to email as there is no DPR on file.  ?

## 2022-01-02 NOTE — Progress Notes (Signed)
Histology and Location of Primary Cancer: Right Upper Lobe with bone metastasis  ? ?Sites of Visceral and Bony Metastatic Disease: Right Fourth Rib ? ?Location(s) of Symptomatic Metastases: Right Fourth Rib ? ?01/01/2022 ?Dr. Valeta Harms (Pulmonary) ? ?The patient has been seen here in our clinic before for these biapical masses.  They have been stable for some time however recent chest X imaging that was completed at primary care office had concern for progression.  Patient had apical cavitary disease in the right side dating back since 2017.  Patient had a bronchoscopy with BAL with cultures that were negative, AFB negative.  She even had PET scan imaging which revealed hypermetabolic uptake within some of these lesions.  I see no previous biopsy that was completed.Patient's initial CT imaging was in January 2017.  Had subsequent serial CT imaging through September 2018.  Patient had a repeat CT scan of the chest in July 2021.  This revealed stable lobulated irregular biapical masses with no progression of disease and stable hilar lymph nodes.  Due to ongoing back pain patient had a repeat chest x-ray at primary care which led to the referral today.  Also in 2022 in the emergency department patient had a repeat CT scan of the chest was the time she was diagnosed with COVID-19 at extensive bilateral groundglass opacities within the lung.  These series of images have been reviewed today.  The area of scarring within the apices had been the same this entire time. ?  ?Patient is a longtime smoker she quit several years ago however smoked at least a half a pack a day for 40+ years.  Also lives with her ex-husband who was a heavy smoker. ?  ?OV 01/01/2022: Patient had recent CT scan of the chest and here for follow-up after that.See to chest completed on 12/26/2021 revealed significant enlargement of a right apical pleural-based mass now 5.7 cm with associated multiple parenchymal and subpleural nodules concerning for metastatic  disease.  Additionally there is direct involvement of the chest wall from the right apical mass with bony destruction of the right fourth rib. ? ? ?Past/Anticipated chemotherapy by medical oncology, if any:   NA ? ?Pain on a scale of 0-10 is:  No ? ? ?If Spine Met(s), symptoms, if any, include: ?Bowel/Bladder retention or incontinence (please describe): No bowel but has bladder prolapse urinary leakage. ?Numbness or weakness in extremities (please describe): No ?Current Decadron regimen, if applicable:  No ? ?Ambulatory status? Walker? Wheelchair?: walker ? ?SAFETY ISSUES: ?Prior radiation? No ?Pacemaker/ICD? No ?Possible current pregnancy? No ?Is the patient on methotrexate? No ? ?Current Complaints / other details:   ? ? ?

## 2022-01-02 NOTE — Telephone Encounter (Signed)
Spoke with patient's granddaughter Lares and scheduled a Mychart Palliative Consult for 01/06/22 @ 1 PM.  ? ?Consent obtained; updated Outlook/Netsmart/Team List and Epic.  ? ? ?

## 2022-01-03 ENCOUNTER — Telehealth: Payer: Self-pay

## 2022-01-03 ENCOUNTER — Other Ambulatory Visit (HOSPITAL_COMMUNITY): Payer: Self-pay

## 2022-01-03 NOTE — Telephone Encounter (Signed)
Patient Advocate Encounter ?  ?Received notification from patient calls that prior authorization for Percocet 5/325mg  tabs is required by his/her insurance [employer]. ?  ?PA submitted on 01/03/22 ? ?Key#: [id number] ? ?Status is pending ?   ?[facility] will continue to follow: ? ?Patient Advocate ?Fax: [phone]  ?

## 2022-01-05 DIAGNOSIS — C3411 Malignant neoplasm of upper lobe, right bronchus or lung: Secondary | ICD-10-CM | POA: Insufficient documentation

## 2022-01-05 NOTE — Progress Notes (Signed)
[place]         [phone]) [id number] ________________________________  Initial Outpatient Consultation - Conducted via telephone due to current COVID-19 concerns for limiting patient exposure  Name: Brooke Mora MRN: [id number]  Date: 01/06/2022  DOB: Aug 02, 1929  REFERRING PHYSICIAN: Garner Nash, DO  DIAGNOSIS: 88 yo woman with an enlarging right upper lung mass with painful invasion of the rib/chest wall.     ICD-10-CM   1. 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 ([place])  C34.11       HISTORY OF PRESENT ILLNESS::Brooke Mora is a 88 y.o. female referred for management of a right upper lung suspected lung cancer.  Patient's granddaughter is here to help communicate and translate.  Patient does understand some English however granddaughter is here and is a Personnel officer at [employer].   The patient has been seen here in the pulmonary clinic before for biapical lung masses.  They have been stable for some time however recent chest X imaging that was completed at primary care office had concern for progression.  Patient had apical cavitary disease in the right side dating back since 2017.  Patient had a bronchoscopy with BAL with cultures that were negative, AFB negative.  She even had PET scan imaging which revealed hypermetabolic uptake within some of these lesions.  I see no previous biopsy that was completed.Patient's initial CT imaging was in January 2017.  Had subsequent serial CT imaging through September 2018.  Patient had a repeat CT scan of the chest in July 2021.  This revealed stable lobulated irregular biapical masses with no progression of disease and stable hilar lymph nodes.  Due to ongoing back pain patient had a repeat chest x-ray at primary care which led to the referral today.  Also in 2022 in the emergency department patient had a repeat CT scan of the chest was the time she was diagnosed with COVID-19 at extensive bilateral groundglass  opacities within the lung.  These series of images have been reviewed today.  The area of scarring within the apices had been the same this entire time.   Patient is a longtime smoker she quit several years ago however smoked at least a half a pack a day for 40+ years.  Also lives with her ex-husband who was a heavy smoker.   OV 01/01/2022: Patient had recent CT scan of the chest and here for follow-up after that.See to chest completed on 12/26/2021 revealed significant enlargement of a right apical pleural-based mass now 5.7 cm with associated multiple parenchymal and subpleural nodules concerning for metastatic disease.  Additionally there is direct involvement of the chest wall from the right apical mass with bony destruction of the right fourth and fifth rib     The patient was offered biopsy by Dr. Valeta Harms but, after an informed discussion, they decided to forego the risks of biopsy in hopes that radiation therapy could be used without biopsy for palliation.  PREVIOUS RADIATION THERAPY: No  Past Medical History:  Diagnosis Date   COPD (chronic obstructive pulmonary disease) (HCC)    slight    Emphysema lung (HCC)    GERD (gastroesophageal reflux disease)    High cholesterol    Hypertension    patient states b/p up and down not on meds   Kidney stones    SVT (supraventricular tachycardia) ([facility])   :   Past Surgical History:  Procedure Laterality Date   ABDOMINAL HYSTERECTOMY  CATARACT EXTRACTION     HERNIA REPAIR     INCISIONAL HERNIA REPAIR N/A 07/18/2014   Procedure: REPAIR OF INACERATED INCISIONAL HERNIA WITH MESH;  Surgeon: Armandina Gemma, MD;  Location: [facility];  Service: General;  Laterality: N/A;   INSERTION OF MESH N/A 07/18/2014   Procedure: INSERTION OF MESH;  Surgeon: Armandina Gemma, MD;  Location: [facility];  Service: General;  Laterality: N/A;   KNEE SURGERY     VIDEO BRONCHOSCOPY Bilateral 11/05/2015   Procedure: VIDEO BRONCHOSCOPY WITHOUT FLUORO;  Surgeon: Javier Glazier, MD;   Location: [facility];  Service: Cardiopulmonary;  Laterality: Bilateral;  :   Current Outpatient Medications:    nitroGLYCERIN (NITROSTAT) 0.3 MG SL tablet, 1 tab(s), Disp: , Rfl:    acetaminophen (TYLENOL) 500 MG tablet, Take 1,000 mg by mouth every 6 (six) hours as needed for moderate pain., Disp: , Rfl:    Diclofenac Sodium 3 % GEL, Apply 1 application. topically 2 (two) times daily., Disp: , Rfl:    furosemide (LASIX) 20 MG tablet, 1 tab(s), Disp: , Rfl:    gabapentin (NEURONTIN) 300 MG capsule, Take 300 mg by mouth daily as needed (pain)., Disp: , Rfl:    metoprolol succinate (TOPROL-XL) 25 MG 24 hr tablet, Take 0.5 tablets (12.5 mg total) by mouth daily., Disp: 45 tablet, Rfl: 0   mirtazapine (REMERON) 15 MG tablet, Take 30 mg by mouth at bedtime., Disp: , Rfl:    omeprazole (PRILOSEC) 20 MG capsule, Take 20 mg by mouth daily., Disp: , Rfl:    oxyCODONE-acetaminophen (PERCOCET) 5-325 MG tablet, Take 1 tablet by mouth every 6 (six) hours as needed for severe pain., Disp: 60 tablet, Rfl: 0   traMADol (ULTRAM) 50 MG tablet, Take 50 mg by mouth every 6 (six) hours as needed. Twice daily, Disp: , Rfl: :   Allergies  Allergen Reactions   Nabumetone Itching and Swelling  :   Family History  Problem Relation Age of Onset   Diabetes Mother   :   Social History   Socioeconomic History   Marital status: Widowed    Spouse name: Not on file   Number of children: Not on file   Years of education: Not on file   Highest education level: Not on file  Occupational History   Not on file  Tobacco Use   Smoking status: Former    Packs/day: 0.50    Years: 60.00    Pack years: 30.00    Types: Cigarettes    Quit date: 10/27/2006    Years since quitting: 15.2   Smokeless tobacco: Never  Vaping Use   Vaping Use: Never used  Substance and Sexual Activity   Alcohol use: No    Alcohol/week: 0.0 standard drinks   Drug use: No   Sexual activity: Not on file  Other Topics Concern   Not  on file  Social History Narrative   Not on file   Social Determinants of Health   Financial Resource Strain: Not on file  Food Insecurity: Not on file  Transportation Needs: Not on file  Physical Activity: Not on file  Stress: Not on file  Social Connections: Not on file  Intimate Partner Violence: Not on file  :  REVIEW OF SYSTEMS:  A 15 point review of systems is documented in the electronic medical record. This was obtained by the nursing staff. However, I reviewed this with the patient to discuss relevant findings and make appropriate changes.     PHYSICAL EXAM:  Height 5\' 4"  (1.626 m), weight 186 lb (84.4 kg). Per Icard:  KPS = Physical Exam Vitals reviewed.  Constitutional:      General: She is not in acute distress.    Appearance: She is well-developed.  HENT:     Head: Normocephalic and atraumatic.  Eyes:     General: No scleral icterus.    Conjunctiva/sclera: Conjunctivae normal.     Pupils: Pupils are equal, round, and reactive to light.  Neck:     Vascular: No JVD.     Trachea: No tracheal deviation.  Cardiovascular:     Rate and Rhythm: Normal rate and regular rhythm.     Heart sounds: Normal heart sounds. No murmur heard. Pulmonary:     Effort: Pulmonary effort is normal. No tachypnea, accessory muscle usage or respiratory distress.     Breath sounds: Normal breath sounds. No stridor. No wheezing, rhonchi or rales.  Abdominal:     General: Bowel sounds are normal. There is no distension.     Palpations: Abdomen is soft.     Tenderness: There is no abdominal tenderness.  Musculoskeletal:        General: No tenderness.     Cervical back: Neck supple.  Lymphadenopathy:     Cervical: No cervical adenopathy.  Skin:    General: Skin is warm and dry.     Capillary Refill: Capillary refill takes less than 2 seconds.     Findings: No rash.  Neurological:     Mental Status: She is alert and oriented to person, place, and time.  Psychiatric:        Behavior:  Behavior normal.     100 - Normal; no complaints; no evidence of disease. 90   - Able to carry on normal activity; minor signs or symptoms of disease. 80   - Normal activity with effort; some signs or symptoms of disease. 69   - Cares for self; unable to carry on normal activity or to do active work. 60   - Requires occasional assistance, but is able to care for most of his personal needs. 50   - Requires considerable assistance and frequent medical care. 61   - Disabled; requires special care and assistance. 41   - Severely disabled; hospital admission is indicated although death not imminent. 1   - Very sick; hospital admission necessary; active supportive treatment necessary. 10   - Moribund; fatal processes progressing rapidly. 0     - Dead  Karnofsky DA, Abelmann [place], Craver LS and Burchenal [facility] [phone]) The use of the nitrogen mustards in the palliative treatment of carcinoma: with particular reference to bronchogenic carcinoma Cancer 1 634-56  LABORATORY DATA:  Lab Results  Component Value Date   WBC 11.9 (H) 12/04/2020   HGB 15.7 (H) 12/04/2020   HCT 49.0 (H) 12/04/2020   MCV 88.9 12/04/2020   PLT 319 12/04/2020   Lab Results  Component Value Date   NA 140 12/04/2020   K 4.1 12/04/2020   CL 96 (L) 12/04/2020   CO2 31 12/04/2020   Lab Results  Component Value Date   ALT 21 11/28/2020   AST 21 11/28/2020   ALKPHOS 56 11/28/2020   BILITOT 0.8 11/28/2020     RADIOGRAPHY: CT Super D Chest Wo Contrast  Result Date: 12/27/2021 CLINICAL DATA:  Lung nodules emphysema, former smoker EXAM: CT CHEST WITHOUT CONTRAST TECHNIQUE: Multidetector CT imaging of the chest was performed using thin slice collimation for electromagnetic bronchoscopy planning purposes, without intravenous contrast.  RADIATION DOSE REDUCTION: This exam was performed according to the departmental dose-optimization program which includes automated exposure control, adjustment of the mA and/or kV according to  patient size and/or use of iterative reconstruction technique. COMPARISON:  CT chest angiogram, 11/20/2020, CT chest, 04/27/2020 FINDINGS: Cardiovascular: Aortic atherosclerosis. Normal heart size. Scattered left and right coronary artery calcifications. No pericardial effusion. Mediastinum/Nodes: Unchanged enlarged pretracheal lymph nodes, measuring up to 2.0 x 1.4 cm (series 2, image 57). Thyroid gland, trachea, and esophagus demonstrate no significant findings. Lungs/Pleura: Moderate centrilobular and paraseptal emphysema. Significant interval enlargement of a pleural-based mass of posterior right apex, now measuring 5.7 x 4.5 cm in greatest axial section, previously 3.9 x 2.3 cm (series 8, image 31). There are additional new, smaller pleural nodules about the apex (series 8, image 26) as well as multiple new pleural nodules throughout the lung, largest nodule in the dependent right lower lobe measuring 2.4 x 1.7 cm (series 8, image 107). Unchanged, partially calcified scarring of the posterior left apex (series 8, image 39). Trace right pleural effusion. Upper Abdomen: No acute abnormality. Hepatic steatosis. Large, partially imaged cyst of the left lobe of the liver measuring at least 12.3 cm (series 2, image 137). Musculoskeletal: No chest wall abnormality. New bony destruction of the head of the right fourth rib underlying right apical mass (series 2, image 29). IMPRESSION: 1. Significant interval enlargement of a pleural-based mass of posterior right apex, measuring 5.7 x 4.5 cm. There are additional new, smaller pleural nodules about the apex as well as multiple new pleural nodules throughout the lung, largest nodule in the dependent right lower lobe measuring 2.4 x 1.7 cm. Findings are consistent with primary lung malignancy and metastatic disease. 2. Direct involvement of the underlying chest wall by right apical mass, with new bony destruction of the head of the right fourth rib. 3. Unchanged enlarged  pretracheal lymph nodes, concerning for nodal metastatic disease. 4. Trace right pleural effusion, presumed malignant. 5. Emphysema. 6. Coronary artery disease. 7. Large, partially imaged cyst of the left lobe of the liver, although almost certainly benign, this may be symptomatic due to large size and mass effect. Aortic Atherosclerosis (ICD10-I70.0) and Emphysema (ICD10-J43.9). Electronically Signed   By: Delanna Ahmadi M.D.   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   Today, I talked to the patient and family about the findings and work-up thus far.  We discussed the natural history of locally advanced metastatic lung cancer and general treatment, highlighting the role of radiotherapy in the management.  We discussed the available radiation techniques, and focused on the details of logistics and delivery.  We reviewed the anticipated acute and late sequelae associated with radiation in this setting.  The patient was encouraged to ask questions that I answered to the best of my ability.    The patient would like to proceed with radiation and will be scheduled for CT simulation.  However, her caregiver granddaughter, Brooke Mora plans to drop off FMLA paperwork first to get time excused from work, then, we will schedule CT simulation and 30 Gy in 10 fractions to the right upper lung mass  I personally spent 45 minutes in this encounter including chart review, reviewing  radiological studies, meeting face-to-face with the patient, entering orders and completing documentation.  Given current concerns for patient exposure during the COVID-19 pandemic, this encounter was conducted via telephone. The patient was notified  in advance and was offered a [facility] meeting to allow for face to face communication but unfortunately reported that he/she did not have the appropriate resources/technology to support such a visit and instead preferred to proceed with telephone consult. The patient has given verbal consent for this type of encounter. The time spent during this encounter was 45 minutes. The attendants for this meeting include Tyler Pita MD, the  patient, Brooke Mora and family members Brooke Mora. During the encounter, Tyler Pita MD, was located at [facility] Radiation Oncology Department.  Patient, Brooke Mora and family, Brooke Mora were located at home.     ------------------------------------------------   Tyler Pita, MD [place] Director and Director of Stereotactic Radiosurgery Direct Dial: [phone]   Fax: [phone] [email or web address]   Skype   LinkedIn

## 2022-01-06 ENCOUNTER — Ambulatory Visit
Admission: RE | Admit: 2022-01-06 | Discharge: 2022-01-06 | Disposition: A | Discharge status: Home / Self Care | Payer: Medicaid Other | Source: Ambulatory Visit | Attending: Radiation Oncology | Admitting: Radiation Oncology

## 2022-01-06 ENCOUNTER — Other Ambulatory Visit: Payer: Self-pay

## 2022-01-06 ENCOUNTER — Telehealth: Payer: Medicaid Other | Admitting: Hospice

## 2022-01-06 ENCOUNTER — Other Ambulatory Visit (HOSPITAL_COMMUNITY): Payer: Self-pay

## 2022-01-06 VITALS — Ht 64.0 in | Wt 186.0 lb

## 2022-01-06 DIAGNOSIS — R918 Other nonspecific abnormal finding of lung field: Secondary | ICD-10-CM | POA: Insufficient documentation

## 2022-01-06 DIAGNOSIS — I10 Essential (primary) hypertension: Secondary | ICD-10-CM | POA: Insufficient documentation

## 2022-01-06 DIAGNOSIS — Z8619 Personal history of other infectious and parasitic diseases: Secondary | ICD-10-CM | POA: Insufficient documentation

## 2022-01-06 DIAGNOSIS — G629 Polyneuropathy, unspecified: Secondary | ICD-10-CM | POA: Insufficient documentation

## 2022-01-06 DIAGNOSIS — R7303 Prediabetes: Secondary | ICD-10-CM | POA: Insufficient documentation

## 2022-01-06 DIAGNOSIS — R52 Pain, unspecified: Secondary | ICD-10-CM

## 2022-01-06 DIAGNOSIS — M1712 Unilateral primary osteoarthritis, left knee: Secondary | ICD-10-CM | POA: Insufficient documentation

## 2022-01-06 DIAGNOSIS — C3411 Malignant neoplasm of upper lobe, right bronchus or lung: Secondary | ICD-10-CM

## 2022-01-06 DIAGNOSIS — Z79899 Other long term (current) drug therapy: Secondary | ICD-10-CM | POA: Diagnosis not present

## 2022-01-06 DIAGNOSIS — Z87442 Personal history of urinary calculi: Secondary | ICD-10-CM | POA: Diagnosis not present

## 2022-01-06 DIAGNOSIS — I471 Supraventricular tachycardia: Secondary | ICD-10-CM | POA: Insufficient documentation

## 2022-01-06 DIAGNOSIS — I872 Venous insufficiency (chronic) (peripheral): Secondary | ICD-10-CM | POA: Insufficient documentation

## 2022-01-06 DIAGNOSIS — E7800 Pure hypercholesterolemia, unspecified: Secondary | ICD-10-CM | POA: Diagnosis not present

## 2022-01-06 DIAGNOSIS — J432 Centrilobular emphysema: Secondary | ICD-10-CM | POA: Insufficient documentation

## 2022-01-06 DIAGNOSIS — K219 Gastro-esophageal reflux disease without esophagitis: Secondary | ICD-10-CM | POA: Insufficient documentation

## 2022-01-06 DIAGNOSIS — R9389 Abnormal findings on diagnostic imaging of other specified body structures: Secondary | ICD-10-CM | POA: Insufficient documentation

## 2022-01-06 DIAGNOSIS — Z87891 Personal history of nicotine dependence: Secondary | ICD-10-CM | POA: Diagnosis not present

## 2022-01-06 DIAGNOSIS — I119 Hypertensive heart disease without heart failure: Secondary | ICD-10-CM | POA: Insufficient documentation

## 2022-01-06 DIAGNOSIS — E785 Hyperlipidemia, unspecified: Secondary | ICD-10-CM | POA: Insufficient documentation

## 2022-01-06 DIAGNOSIS — J439 Emphysema, unspecified: Secondary | ICD-10-CM

## 2022-01-06 DIAGNOSIS — F5104 Psychophysiologic insomnia: Secondary | ICD-10-CM | POA: Insufficient documentation

## 2022-01-06 DIAGNOSIS — Z515 Encounter for palliative care: Secondary | ICD-10-CM

## 2022-01-06 NOTE — Progress Notes (Signed)
? ? ?Manufacturing engineer ?Community Palliative Care Consult Note ?Telephone: [phone]  ?Fax: [phone] ? ?PATIENT NAME: Brooke Mora ?[address] ?[place] [address] ?[phone] (home)  ?DOB: 1929/10/03 ?MRN: [id number] ? ?PRIMARY CARE PROVIDER:    ?Brooke Mccreedy, MD,  ?[address] ?[place] [address] ?[phone] ? ?REFERRING PROVIDER:   ?Dr. Leory Plowman Mora  ? ?RESPONSIBLE PARTY:  Self/Brooke Mora/Juliana ?Contact Information   ? ? Name Relation Home Work Mobile  ? Brooke Mora Daughter [phone]  [phone]  ? Brooke, Mora Granddaughter [phone]  [phone]  ? ?  ? ? ?TELEHEALTH VISIT STATEMENT ?Due to the COVID-19 crisis, this visit was done via telemedicine from my office and it was initiated and consent by this patient and or family.  ?I connected with patient OR PROXY by a telephone/video  and verified that I am speaking with the correct person. I discussed the limitations of evaluation and management by telemedicine. The patient expressed understanding and agreed to proceed. ?Palliative Care was asked to follow this patient to address advance care planning, complex medical decision making and goals of care clarification. Brooke Mora and Brooke Mora are with patient during visit This is the initial visit.  ? ?  ASSESSMENT AND / RECOMMENDATIONS:  ? ?Advance Care Planning: Our advance care planning conversation included a discussion about:    ?The value and importance of advance care planning  ?Difference between Hospice and Palliative care ?Exploration of goals of care in the event of a sudden injury or illness  ?Identification and preparation of a healthcare agent  ?Review and updating or creation of an  advance directive document . ?Decision not to resuscitate or to de-escalate disease focused treatments due to poor prognosis. ? ?CODE STATUS: DNR ? ?Goals of Care: Goals include to maximize quality of life and symptom management ? ?I spent 16  minutes providing this  initial consultation. More than 50% of the time in this consultation was spent on counseling patient and coordinating communication. ?-------------------------------------------------------------------------------------------------------------------------------------- ? ?Symptom Management/Plan: ?Emphysema: Avoid triggers.  Patient no longer smoking.  Encourage slow deep breathing.  Albuterol on hand. ?Lung mass: Appointment with Oncologist later today for discussion on plan. ?Pain: Generalized.  Managed with Tramadol. ?Routine CBC CMP. ?Follow up: Palliative care will continue to follow for complex medical decision making, advance care planning, and clarification of goals. Return 6 weeks or prn. Encouraged to call provider sooner with any concerns.  ? ?Family /Caregiver/Community Supports: Patient lives at home with her daughter and grand daughter.  Patient with limited English, understands but not able to speak fluently.  Either granddaughter or daughter is always with her daughter facilitate communication and her care.  ? ?HOSPICE ELIGIBILITY/DIAGNOSIS: TBD ? ?Chief Complaint: Initial Palliative care visit ? ?HISTORY OF PRESENT ILLNESS:  Brooke Mora is a 88 y.o. year old female  with multiple morbidities requiring close monitoring and with high risk of complications and  mortality: COPD-emphysema, lung mass.  Patient is following up with oncology for discussion on plan of care.  She denies pain/discomfort, in no respiratory distress. ?History obtained from review of EMR, discussion with primary team, caregiver, family and/or Brooke Mora.  ?Review and summarization of Epic records shows history from other than patient. Rest of 10 point ROS asked and negative.  ?I reviewed as needed, available labs, patient records, imaging, studies and related documents from the EMR. ? ?ROS ?General: NAD ?EYES: denies vision changes ?ENMT: denies dysphagia ?Cardiovascular: denies chest pain/discomfort ?Pulmonary: denies cough,  denies SOB ?Abdomen: endorses good  appetite, denies constipation/diarrhea ?GU: denies dysuria, urinary frequency ?MSK:  endorses weakness,  no falls reported ?Skin: denies rashes or wounds ?Neurological: denies pain, denies insomnia ?Psych: Endorses positive mood ?Heme/lymph/immuno: denies bruises, abnormal bleeding ? ? ?PAST MEDICAL HISTORY:  ?Active Ambulatory Problems  ?  Diagnosis Date Noted  ? Incisional hernia, without obstruction or gangrene, incarcerated 06/12/2014  ? SVT (supraventricular tachycardia) ([place]) 11/01/2015  ? Cavitary lesion of lung 11/01/2015  ? Mycobacterium avium-intracellulare infection ([place]) 12/24/2015  ? HTN (hypertension) 12/24/2015  ? GERD (gastroesophageal reflux disease) 12/24/2015  ? Nephrolithiasis 12/24/2015  ? COPD exacerbation ([place]) 12/24/2015  ? Former cigarette smoker 12/24/2015  ? Hyperglycemia 12/24/2015  ? Dyslipidemia 12/24/2015  ? Acute hypoxemic respiratory failure due to COVID-19 [facility]) 11/20/2020  ? Hospice care patient 01/01/2022  ? 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 ([place]) 01/05/2022  ? ?Resolved Ambulatory Problems  ?  Diagnosis Date Noted  ? Incisional hernia, incarcerated 07/18/2014  ? Troponin level elevated 11/01/2015  ? CAP (community acquired pneumonia) 11/16/2015  ? ?Past Medical History:  ?Diagnosis Date  ? COPD (chronic obstructive pulmonary disease) ([place])   ? Emphysema lung ([place])   ? High cholesterol   ? Hypertension   ? Kidney stones   ? ? ?SOCIAL HX:  ?Social History  ? ?Tobacco Use  ? Smoking status: Former  ?  Packs/day: 0.50  ?  Years: 60.00  ?  Pack years: 30.00  ?  Types: Cigarettes  ?  Quit date: 10/27/2006  ?  Years since quitting: 15.2  ? Smokeless tobacco: Never  ?Substance Use Topics  ? Alcohol use: No  ?  Alcohol/week: 0.0 standard drinks  ? ?  ?FAMILY HX:  ?Family History  ?Problem Relation Age of Onset  ? Diabetes Mother   ?   ? ?ALLERGIES:  ?Allergies  ?Allergen Reactions  ? Nabumetone Itching and Swelling  ?   ? ?PERTINENT  MEDICATIONS:  ?Outpatient Encounter Medications as of 01/06/2022  ?Medication Sig  ? acetaminophen (TYLENOL) 500 MG tablet Take 1,000 mg by mouth every 6 (six) hours as needed for moderate pain.  ? gabapentin (NEURONTIN) 300 MG capsule Take 300 mg by mouth daily as needed (pain).  ? metoprolol succinate (TOPROL-XL) 25 MG 24 hr tablet Take 0.5 tablets (12.5 mg total) by mouth daily.  ? mirtazapine (REMERON) 15 MG tablet Take 30 mg by mouth at bedtime.  ? omeprazole (PRILOSEC) 20 MG capsule Take 20 mg by mouth daily.  ? oxyCODONE-acetaminophen (PERCOCET) 5-325 MG tablet Take 1 tablet by mouth every 6 (six) hours as needed for severe pain.  ? traMADol (ULTRAM) 50 MG tablet Take 50 mg by mouth every 6 (six) hours as needed. Twice daily  ? ?No facility-administered encounter medications on file as of 01/06/2022.  ? ? ? ?Thank you for the opportunity to participate in the care of Brooke Mora.  The palliative care team will continue to follow. Please call our office at [phone] if we can be of additional assistance.  ? ?Note: Portions of this note were generated with Lobbyist. Dictation errors may occur despite best attempts at proofreading. ? ?Teodoro Spray, NP  ? ?  ?

## 2022-01-06 NOTE — Telephone Encounter (Signed)
Received notification from [place] ([place]) regarding a prior authorization for PERCOCET 5/325MG . Authorization has been APPROVED from 03.10.23 to 9.6.23.  ? ?Per test claim, copay for 15 days supply is $4 ? ? ?Authorization # PA Case: [id number] ? ? ?

## 2022-01-08 ENCOUNTER — Ambulatory Visit: Payer: Medicaid Other | Admitting: Pulmonary Disease

## 2022-01-14 ENCOUNTER — Ambulatory Visit
Admission: RE | Admit: 2022-01-14 | Discharge: 2022-01-14 | Disposition: A | Discharge status: Home / Self Care | Payer: Medicaid Other | Source: Ambulatory Visit | Attending: Radiation Oncology | Admitting: Radiation Oncology

## 2022-01-14 DIAGNOSIS — C3411 Malignant neoplasm of upper lobe, right bronchus or lung: Secondary | ICD-10-CM | POA: Insufficient documentation

## 2022-01-14 DIAGNOSIS — Z510 Encounter for antineoplastic radiation therapy: Secondary | ICD-10-CM | POA: Diagnosis present

## 2022-01-14 NOTE — Progress Notes (Signed)
?  Radiation Oncology         [phone] ?________________________________ ? ?Name: Brooke Mora MRN: [id number]  ?Date: 01/14/2022  DOB: January 02, 1929 ? ?SIMULATION AND TREATMENT PLANNING NOTE ? ?  ICD-10-CM   ?1. 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 ([place])  C34.11   ?  ? ? ?DIAGNOSIS:  88 yo woman with an enlarging right upper lung mass with painful invasion of the rib/chest wall.  ? ?NARRATIVE:  The patient was brought to the [facility].  Identity was confirmed.  All relevant records and images related to the planned course of therapy were reviewed.  The patient freely provided informed written consent to proceed with treatment after reviewing the details related to the planned course of therapy. The consent form was witnessed and verified by the simulation staff.  Then, the patient was set-up in a stable reproducible  supine position for radiation therapy.  CT images were obtained.  Surface markings were placed.  The CT images were loaded into the planning software.  Then the target and avoidance structures were contoured.  Treatment planning then occurred.  The radiation prescription was entered and confirmed.  Then, I designed and supervised the construction of a total of 6 medically necessary complex treatment devices (pedning final plan in [place]), including a BodyFix immobilization mold custom fitted to the patient along with 5 multileaf collimators conformally shaped radiation around the treatment target while shielding critical structures such as the heart and spinal cord maximally.  I have requested : 3D Simulation  I have requested a DVH of the following structures: Left lung, right lung, spinal cord, heart, esophagus, and target.  I have ordered:Nutrition Consult ? ?PLAN:  The patient will receive 30 Gy in 10 fractions. ? ?________________________________ ? ?Sheral Apley Tammi Klippel, M.D. ? ?

## 2022-01-16 DIAGNOSIS — Z510 Encounter for antineoplastic radiation therapy: Secondary | ICD-10-CM | POA: Diagnosis not present

## 2022-01-21 ENCOUNTER — Other Ambulatory Visit: Payer: Self-pay

## 2022-01-21 ENCOUNTER — Ambulatory Visit
Admission: RE | Admit: 2022-01-21 | Discharge: 2022-01-21 | Disposition: A | Discharge status: Home / Self Care | Payer: Medicaid Other | Source: Ambulatory Visit | Attending: Radiation Oncology | Admitting: Radiation Oncology

## 2022-01-21 DIAGNOSIS — Z510 Encounter for antineoplastic radiation therapy: Secondary | ICD-10-CM | POA: Diagnosis not present

## 2022-01-22 ENCOUNTER — Ambulatory Visit
Admission: RE | Admit: 2022-01-22 | Discharge: 2022-01-22 | Disposition: A | Discharge status: Home / Self Care | Payer: Medicaid Other | Source: Ambulatory Visit | Attending: Radiation Oncology | Admitting: Radiation Oncology

## 2022-01-22 DIAGNOSIS — Z510 Encounter for antineoplastic radiation therapy: Secondary | ICD-10-CM | POA: Diagnosis not present

## 2022-01-23 ENCOUNTER — Ambulatory Visit
Admission: RE | Admit: 2022-01-23 | Discharge: 2022-01-23 | Disposition: A | Discharge status: Home / Self Care | Payer: Medicaid Other | Source: Ambulatory Visit | Attending: Radiation Oncology | Admitting: Radiation Oncology

## 2022-01-23 ENCOUNTER — Other Ambulatory Visit: Payer: Self-pay

## 2022-01-23 DIAGNOSIS — Z510 Encounter for antineoplastic radiation therapy: Secondary | ICD-10-CM | POA: Diagnosis not present

## 2022-01-24 ENCOUNTER — Ambulatory Visit
Admission: RE | Admit: 2022-01-24 | Discharge: 2022-01-24 | Disposition: A | Discharge status: Home / Self Care | Payer: Medicaid Other | Source: Ambulatory Visit | Attending: Radiation Oncology | Admitting: Radiation Oncology

## 2022-01-24 DIAGNOSIS — Z510 Encounter for antineoplastic radiation therapy: Secondary | ICD-10-CM | POA: Diagnosis not present

## 2022-01-27 ENCOUNTER — Other Ambulatory Visit: Payer: Self-pay

## 2022-01-27 ENCOUNTER — Ambulatory Visit
Admission: RE | Admit: 2022-01-27 | Discharge: 2022-01-27 | Disposition: A | Discharge status: Home / Self Care | Payer: Medicaid Other | Source: Ambulatory Visit | Attending: Radiation Oncology | Admitting: Radiation Oncology

## 2022-01-27 DIAGNOSIS — C3411 Malignant neoplasm of upper lobe, right bronchus or lung: Secondary | ICD-10-CM | POA: Diagnosis present

## 2022-01-27 DIAGNOSIS — Z510 Encounter for antineoplastic radiation therapy: Secondary | ICD-10-CM | POA: Diagnosis present

## 2022-01-28 ENCOUNTER — Ambulatory Visit
Admission: RE | Admit: 2022-01-28 | Discharge: 2022-01-28 | Disposition: A | Discharge status: Home / Self Care | Payer: Medicaid Other | Source: Ambulatory Visit | Attending: Radiation Oncology | Admitting: Radiation Oncology

## 2022-01-28 DIAGNOSIS — Z510 Encounter for antineoplastic radiation therapy: Secondary | ICD-10-CM | POA: Diagnosis not present

## 2022-01-29 ENCOUNTER — Other Ambulatory Visit: Payer: Self-pay

## 2022-01-29 ENCOUNTER — Ambulatory Visit
Admission: RE | Admit: 2022-01-29 | Discharge: 2022-01-29 | Disposition: A | Discharge status: Home / Self Care | Payer: Medicaid Other | Source: Ambulatory Visit | Attending: Radiation Oncology | Admitting: Radiation Oncology

## 2022-01-29 DIAGNOSIS — Z510 Encounter for antineoplastic radiation therapy: Secondary | ICD-10-CM | POA: Diagnosis not present

## 2022-01-30 ENCOUNTER — Other Ambulatory Visit: Payer: Self-pay

## 2022-01-30 ENCOUNTER — Ambulatory Visit
Admission: RE | Admit: 2022-01-30 | Discharge: 2022-01-30 | Disposition: A | Discharge status: Home / Self Care | Payer: Medicaid Other | Source: Ambulatory Visit | Attending: Radiation Oncology | Admitting: Radiation Oncology

## 2022-01-30 DIAGNOSIS — Z510 Encounter for antineoplastic radiation therapy: Secondary | ICD-10-CM | POA: Diagnosis not present

## 2022-01-31 ENCOUNTER — Other Ambulatory Visit: Payer: Self-pay

## 2022-01-31 ENCOUNTER — Ambulatory Visit
Admission: RE | Admit: 2022-01-31 | Discharge: 2022-01-31 | Disposition: A | Discharge status: Home / Self Care | Payer: Medicaid Other | Source: Ambulatory Visit | Attending: Radiation Oncology | Admitting: Radiation Oncology

## 2022-01-31 DIAGNOSIS — Z510 Encounter for antineoplastic radiation therapy: Secondary | ICD-10-CM | POA: Diagnosis not present

## 2022-02-03 ENCOUNTER — Encounter: Payer: Self-pay | Admitting: Urology

## 2022-02-03 ENCOUNTER — Other Ambulatory Visit: Payer: Self-pay

## 2022-02-03 ENCOUNTER — Ambulatory Visit
Admission: RE | Admit: 2022-02-03 | Discharge: 2022-02-03 | Disposition: A | Discharge status: Home / Self Care | Payer: Medicaid Other | Source: Ambulatory Visit | Attending: Radiation Oncology | Admitting: Radiation Oncology

## 2022-02-03 DIAGNOSIS — C3411 Malignant neoplasm of upper lobe, right bronchus or lung: Secondary | ICD-10-CM

## 2022-02-03 DIAGNOSIS — Z510 Encounter for antineoplastic radiation therapy: Secondary | ICD-10-CM | POA: Diagnosis not present

## 2022-02-10 ENCOUNTER — Other Ambulatory Visit: Payer: Medicaid Other | Admitting: Hospice

## 2022-02-10 DIAGNOSIS — Z515 Encounter for palliative care: Secondary | ICD-10-CM

## 2022-02-10 DIAGNOSIS — J439 Emphysema, unspecified: Secondary | ICD-10-CM

## 2022-02-10 DIAGNOSIS — R52 Pain, unspecified: Secondary | ICD-10-CM

## 2022-02-10 DIAGNOSIS — R918 Other nonspecific abnormal finding of lung field: Secondary | ICD-10-CM

## 2022-02-10 NOTE — Progress Notes (Signed)
? ? ?Manufacturing engineer ?Community Palliative Care Consult Note ?Telephone: [phone]  ?Fax: [phone] ? ?PATIENT NAME: Brooke Mora ?[address] ?[place] [address] ?[phone] (home)  ?DOB: Jan 19, 1929 ?MRN: [id number] ? ?PRIMARY CARE PROVIDER:    ?Benito Mccreedy, MD,  ?[address] ?[place] [address] ?[phone] ? ?REFERRING PROVIDER:   ?Dr. Leory Plowman Icard  ? ?RESPONSIBLE PARTY:  Self/[name] ?Contact Information   ? ? Name Relation Home Work Mobile  ? Gwenlyn Fudge Daughter [phone]  [phone]  ? Saran, Laviolette Granddaughter [phone]  [phone]  ? ?  ? ? ?TELEHEALTH VISIT STATEMENT ?Due to the COVID-19 crisis, this visit was done via telemedicine from my office and it was initiated and consent by this patient and or family.  ?I connected with patient OR PROXY by a telephone/video  and verified that I am speaking with the correct person. I discussed the limitations of evaluation and management by telemedicine. The patient expressed understanding and agreed to proceed. ?Palliative Care was asked to follow this patient to address advance care planning, complex medical decision making and goals of care clarification. Spoke with [name] who provided update on patient finishing radiation. [name] is with patient during visit  ? ?  ASSESSMENT AND / RECOMMENDATIONS:  ? ?CODE STATUS: DNR ? ?Goals of Care: Goals include to maximize quality of life and symptom management ? ?Symptom Management/Plan: ?Lung mass: Recently completed radiation. Follow up with Oncologist as planned.  ?Emphysema: Avoid triggers.  Patient no longer smoking.  Encourage slow deep breathing.  Albuterol on hand, not using daily ?Pain: Generalized.  Managed with Oxycodone and Tramadol. ?Follow up: Palliative care will continue to follow for complex medical decision making, advance care planning, and clarification of goals. Return 6 weeks or prn. Encouraged to call provider sooner with any  concerns.  ? ?Family /Caregiver/Community Supports: Patient lives at home with her daughter and grand daughter.  Patient with limited English, understands but not able to speak fluently.  Either granddaughter or daughter is always with her daughter facilitate communication and her care.  [name] is the primary caregiver.  Strong family support system identified. ? ?HOSPICE ELIGIBILITY/DIAGNOSIS: TBD ? ?Chief Complaint:Follow up visit ? ?HISTORY OF PRESENT ILLNESS:  [name] is a 88 y.o. year old female  with multiple morbidities requiring close monitoring and with high risk of complications and  mortality: COPD-emphysema, lung mass.  Patient recently finished radiation; follow-up plan with oncologist.  She denies pain/discomfort, in no respiratory distress. ?History obtained from review of EMR, discussion with primary team, caregiver, family and/or [name].  ?Review and summarization of Epic records shows history from other than patient. Rest of 10 point ROS asked and negative.  ?I reviewed as needed, available labs, patient records, imaging, studies and related documents from the EMR. ? ?ROS ?General: NAD ?EYES: denies vision changes ?ENMT: denies dysphagia ?Cardiovascular: denies chest pain/discomfort ?Pulmonary: denies cough, denies SOB ?Abdomen: endorses good appetite, denies constipation/diarrhea ?GU: denies dysuria, urinary frequency ?MSK:  endorses weakness,  no falls reported ?Skin: denies rashes or wounds ?Neurological: Endorses occasional generalized body pain, well managed with current pain regimen, denies insomnia ?Psych: Endorses positive mood ?Heme/lymph/immuno: denies bruises, abnormal bleeding ? ? ?PAST MEDICAL HISTORY:  ?Active Ambulatory Problems  ?  Diagnosis Date Noted  ? Incisional hernia, without obstruction or gangrene, incarcerated 06/12/2014  ? SVT (supraventricular tachycardia) ([place]) 11/01/2015  ? Cavitary lesion of lung 11/01/2015  ? Mycobacterium avium-intracellulare infection  ([place]) 12/24/2015  ? HTN (hypertension) 12/24/2015  ?  GERD (gastroesophageal reflux disease) 12/24/2015  ? Nephrolithiasis 12/24/2015  ? COPD exacerbation ([facility] [place]) 12/24/2015  ? Former cigarette smoker 12/24/2015  ? Hyperglycemia 12/24/2015  ? Dyslipidemia 12/24/2015  ? Acute hypoxemic respiratory failure due to COVID-19 [facility]) 11/20/2020  ? Hospice care patient 01/01/2022  ? 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 Arthritis of left knee 01/06/2022  ? History of infectious disease 01/06/2022  ? Hyperlipidemia, unspecified 01/06/2022  ? Hypertensive heart disease 01/06/2022  ? Lung field abnormal 01/06/2022  ? Neuropathy 01/06/2022  ? Peripheral venous insufficiency 01/06/2022  ? Prediabetes 01/06/2022  ? Psychophysiologic insomnia 01/06/2022  ? Standard chest x-ray abnormal 01/06/2022  ? History of tobacco use 01/06/2022  ? ?Resolved Ambulatory Problems  ?  Diagnosis Date Noted  ? Incisional hernia, incarcerated 07/18/2014  ? Troponin level elevated 11/01/2015  ? CAP (community acquired pneumonia) 11/16/2015  ? ?Past Medical History:  ?Diagnosis Date  ? COPD (chronic obstructive pulmonary disease) ([place])   ? Emphysema lung ([place])   ? High cholesterol   ? Hypertension   ? Kidney stones   ? ? ?SOCIAL HX:  ?Social History  ? ?Tobacco Use  ? Smoking status: Former  ?  Packs/day: 0.50  ?  Years: 60.00  ?  Pack years: 30.00  ?  Types: Cigarettes  ?  Quit date: 10/27/2006  ?  Years since quitting: 15.3  ? Smokeless tobacco: Never  ?Substance Use Topics  ? Alcohol use: No  ?  Alcohol/week: 0.0 standard drinks  ? ?  ?FAMILY HX:  ?Family History  ?Problem Relation Age of Onset  ? Diabetes Mother   ?   ? ?ALLERGIES:  ?Allergies  ?Allergen Reactions  ? Nabumetone Itching and Swelling  ?   ? ?PERTINENT MEDICATIONS:  ?Outpatient Encounter Medications as of 02/10/2022  ?Medication Sig  ? acetaminophen (TYLENOL) 500 MG tablet Take 1,000 mg by mouth every 6 (six) hours as needed for moderate pain.  ?  Diclofenac Sodium 3 % GEL Apply 1 application. topically 2 (two) times daily.  ? furosemide (LASIX) 20 MG tablet 1 tab(s)  ? gabapentin (NEURONTIN) 300 MG capsule Take 300 mg by mouth daily as needed (pain).  ? metoprolol succinate (TOPROL-XL) 25 MG 24 hr tablet Take 0.5 tablets (12.5 mg total) by mouth daily.  ? mirtazapine (REMERON) 15 MG tablet Take 30 mg by mouth at bedtime.  ? nitroGLYCERIN (NITROSTAT) 0.3 MG SL tablet 1 tab(s)  ? omeprazole (PRILOSEC) 20 MG capsule Take 20 mg by mouth daily.  ? oxyCODONE-acetaminophen (PERCOCET) 5-325 MG tablet Take 1 tablet by mouth every 6 (six) hours as needed for severe pain.  ? traMADol (ULTRAM) 50 MG tablet Take 50 mg by mouth every 6 (six) hours as needed. Twice daily  ? ?No facility-administered encounter medications on file as of 02/10/2022.  ? ? ?I spent 40 minutes providing this consultation; this includes time spent with patient/family, chart review and documentation. More than 50% of the time in this consultation was spent on counseling and coordinating communication  ? ?Thank you for the opportunity to participate in the care of [name].  The palliative care team will continue to follow. Please call our office at [phone] if we can be of additional assistance.  ? ?Note: Portions of this note were generated with Lobbyist. Dictation errors may occur despite best attempts at proofreading. ? ?Teodoro Spray, NP  ? ?  ?

## 2022-03-03 ENCOUNTER — Other Ambulatory Visit: Payer: Medicaid Other | Admitting: Hospice

## 2022-03-03 DIAGNOSIS — Z515 Encounter for palliative care: Secondary | ICD-10-CM

## 2022-03-03 DIAGNOSIS — R52 Pain, unspecified: Secondary | ICD-10-CM

## 2022-03-03 DIAGNOSIS — R918 Other nonspecific abnormal finding of lung field: Secondary | ICD-10-CM

## 2022-03-03 DIAGNOSIS — J439 Emphysema, unspecified: Secondary | ICD-10-CM

## 2022-03-03 NOTE — Progress Notes (Signed)
? ? ?Manufacturing engineer ?Community Palliative Care Consult Note ?Telephone: [phone]  ?Fax: [phone] ? ?PATIENT NAME: Brooke Mora ?[address] ?[place] [address] ?[phone] (home)  ?DOB: 05-Nov-1928 ?MRN: [id number] ? ?PRIMARY CARE PROVIDER:    ?Brooke Mccreedy, MD,  ?[address] ?[place] [address] ?[phone] ? ?REFERRING PROVIDER:   ?Dr. Leory Plowman Mora  ? ?RESPONSIBLE PARTY:  Self/Brooke Mora ?Contact Information   ? ? Name Relation Home Work Mobile  ? Brooke Mora Daughter [phone]  [phone]  ? Brooke Mora Granddaughter [phone]  [phone]  ? ?  ? ?I met face to face with patient in her home.  Palliative Care was asked to follow this patient to address advance care planning, complex medical decision making and goals of care clarification. Brooke Mora is with patient during visit  ? ?  ASSESSMENT AND / RECOMMENDATIONS:  ? ?CODE STATUS: DNR ? ?Goals of Care: Goals include to maximize quality of life and symptom management ? ?Visit consisted of counseling and education dealing with the complex and emotionally intense issues of symptom management and palliative care in the setting of serious and potentially life-threatening illness. Palliative care team will continue to support patient, patient's family, and medical team. ? ?Symptom Management/Plan: ?Lung mass: Continue with oncologist as planned for surveillance.  Recently completed radiation.  ?Emphysema: Stable.  Avoid triggers.  Patient no longer smoking.  Encourage slow deep breathing.  Albuterol on hand, not using daily ?Pain: Generalized.  Stable. Managed with Oxycodone and Tramadol ?Follow up: Palliative care will continue to follow for complex medical decision making, advance care planning, and clarification of goals. Return 12 weeks or prn. Encouraged to call provider sooner with any concerns.  ? ?Family /Caregiver/Community Supports: Patient lives at home with her daughter and grand daughter.   Patient with limited English, understands but not able to speak fluently. Brooke Mora is the primary caregiver, speaks [place].  Strong family support system identified. ? ?HOSPICE ELIGIBILITY/DIAGNOSIS: TBD ? ?Chief Complaint:Follow up visit ? ?HISTORY OF PRESENT ILLNESS:  Brooke Mora is a 88 y.o. year old female  with multiple morbidities requiring close monitoring and with high risk of complications and  mortality: COPD-emphysema, lung mass.  Patient in no acute distress.  She denies pain/discomfort, in no respiratory distress. ?History obtained from review of EMR, discussion with primary team, caregiver, family and/or Brooke Mora.  ?Review and summarization of Epic records shows history from other than patient. Rest of 10 point ROS asked and negative.  ?I reviewed as needed, available labs, patient records, imaging, studies and related documents from the EMR. ? ?ROS ?General: NAD ?EYES: denies vision changes ?ENMT: denies dysphagia ?Cardiovascular: denies chest pain/discomfort ?Pulmonary: denies cough, denies SOB ?Abdomen: endorses good appetite, denies constipation/diarrhea ?GU: denies dysuria, urinary frequency ?MSK:  endorses weakness,  no falls reported ?Skin: denies rashes or wounds ?Psych: Endorses positive mood ?Heme/lymph/immuno: denies bruises, abnormal bleeding ? ?Physical Exam: ?Height/Weight: 5 feet 6 inches/180 Ibs ?Constitutional: NAD ?General: Well groomed, cooperative ?EYES: anicteric sclera, lids intact, no discharge  ?ENMT: Moist mucous membrane ?CV: S1 S2, RRR, no LE edema ?Pulmonary: LCTA, no increased work of breathing, no cough, ?Abdomen: active BS + 4 quadrants, soft and non tender ?GU: no suprapubic tenderness ?MSK: weakness, ambulatory ?Skin: warm and dry, no rashes or wounds on visible skin ?Neuro:  weakness, otherwise non focal ?Psych: non-anxious affect ?Hem/lymph/immuno: no widespread bruising ? ? ?PAST MEDICAL HISTORY:  ?Active Ambulatory Problems  ?  Diagnosis Date Noted  ? Incisional  hernia, without  obstruction or gangrene, incarcerated 06/12/2014  ? SVT (supraventricular tachycardia) ([place]) 11/01/2015  ? Cavitary lesion of lung 11/01/2015  ? Mycobacterium avium-intracellulare infection ([place]) 12/24/2015  ? HTN (hypertension) 12/24/2015  ? GERD (gastroesophageal reflux disease) 12/24/2015  ? Nephrolithiasis 12/24/2015  ? COPD exacerbation ([place]) 12/24/2015  ? Former cigarette smoker 12/24/2015  ? Hyperglycemia 12/24/2015  ? Dyslipidemia 12/24/2015  ? Acute hypoxemic respiratory failure due to COVID-19 [facility]) 11/20/2020  ? Hospice care patient 01/01/2022  ? 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 Arthritis of left knee 01/06/2022  ? History of infectious disease 01/06/2022  ? Hyperlipidemia, unspecified 01/06/2022  ? Hypertensive heart disease 01/06/2022  ? Lung field abnormal 01/06/2022  ? Neuropathy 01/06/2022  ? Peripheral venous insufficiency 01/06/2022  ? Prediabetes 01/06/2022  ? Psychophysiologic insomnia 01/06/2022  ? Standard chest x-ray abnormal 01/06/2022  ? History of tobacco use 01/06/2022  ? ?Resolved Ambulatory Problems  ?  Diagnosis Date Noted  ? Incisional hernia, incarcerated 07/18/2014  ? Troponin level elevated 11/01/2015  ? CAP (community acquired pneumonia) 11/16/2015  ? ?Past Medical History:  ?Diagnosis Date  ? COPD (chronic obstructive pulmonary disease) ([place])   ? Emphysema lung ([place])   ? High cholesterol   ? Hypertension   ? Kidney stones   ? ? ?SOCIAL HX:  ?Social History  ? ?Tobacco Use  ? Smoking status: Former  ?  Packs/day: 0.50  ?  Years: 60.00  ?  Pack years: 30.00  ?  Types: Cigarettes  ?  Quit date: 10/27/2006  ?  Years since quitting: 15.3  ? Smokeless tobacco: Never  ?Substance Use Topics  ? Alcohol use: No  ?  Alcohol/week: 0.0 standard drinks  ? ?  ?FAMILY HX:  ?Family History  ?Problem Relation Age of Onset  ? Diabetes Mother   ?   ? ?ALLERGIES:  ?Allergies  ?Allergen Reactions  ? Nabumetone Itching and Swelling  ?   ? ?PERTINENT  MEDICATIONS:  ?Outpatient Encounter Medications as of 03/03/2022  ?Medication Sig  ? acetaminophen (TYLENOL) 500 MG tablet Take 1,000 mg by mouth every 6 (six) hours as needed for moderate pain.  ? Diclofenac Sodium 3 % GEL Apply 1 application. topically 2 (two) times daily.  ? furosemide (LASIX) 20 MG tablet 1 tab(s)  ? gabapentin (NEURONTIN) 300 MG capsule Take 300 mg by mouth daily as needed (pain).  ? metoprolol succinate (TOPROL-XL) 25 MG 24 hr tablet Take 0.5 tablets (12.5 mg total) by mouth daily.  ? mirtazapine (REMERON) 15 MG tablet Take 30 mg by mouth at bedtime.  ? nitroGLYCERIN (NITROSTAT) 0.3 MG SL tablet 1 tab(s)  ? omeprazole (PRILOSEC) 20 MG capsule Take 20 mg by mouth daily.  ? oxyCODONE-acetaminophen (PERCOCET) 5-325 MG tablet Take 1 tablet by mouth every 6 (six) hours as needed for severe pain.  ? traMADol (ULTRAM) 50 MG tablet Take 50 mg by mouth every 6 (six) hours as needed. Twice daily  ? ?No facility-administered encounter medications on file as of 03/03/2022.  ? ? ?I spent 60 minutes providing this consultation; this includes time spent with patient/family, chart review and documentation. More than 50% of the time in this consultation was spent on counseling and coordinating communication  ? ?Thank you for the opportunity to participate in the care of Brooke Mora.  The palliative care team will continue to follow. Please call our office at [phone] if we can be of additional assistance.  ? ?Note: Portions of this note were  generated with Lobbyist. Dictation errors may occur despite best attempts at proofreading. ? ?Teodoro Spray, NP  ? ?  ?

## 2022-03-06 ENCOUNTER — Telehealth: Payer: Self-pay | Admitting: Radiation Oncology

## 2022-03-06 ENCOUNTER — Telehealth: Payer: Self-pay

## 2022-03-06 ENCOUNTER — Ambulatory Visit: Payer: Medicaid Other | Admitting: Urology

## 2022-03-06 NOTE — Progress Notes (Signed)
?  Radiation Oncology         [phone] ?________________________________ ? ?Name: KENNIS BUELL MRN: [id number]  ?Date: 02/03/2022  DOB: 1929/10/07 ? ?End of Treatment Note ? ?Diagnosis:   88 yo woman with an enlarging right upper lung mass with painful invasion of the rib/chest wall.     ? ?Indication for treatment:  Palliation      ? ?Radiation treatment dates:   01/21/22 - 02/03/22 ? ?Site/dose:   The target in the RUL lung and right chest wall/4th rib were treated to 30 Gy in 10 fractions ? ?Beams/energy:   A 3D field set-up was employed with 6 MV X-rays ? ?Narrative: The patient tolerated radiation treatment relatively well.  She did report modest fatigue and some shortness of breath with exertion that remained unchanged from her baseline.  She did not experience chest pain, hemoptysis, productive cough or dysphagia. ? ?Plan: The patient has completed radiation treatment. The patient will return to radiation oncology clinic for routine followup in one month. I advised her to call or return sooner if she has any questions or concerns related to her recovery or treatment. ?________________________________ ? ?Sheral Apley Tammi Klippel, M.D. ?  ?

## 2022-03-06 NOTE — Telephone Encounter (Signed)
Using spanish interpreter service [phone] w/ interpreter [phone]. I left a voicemail reminding patient of her 10:00am-03/06/22 telephone appointment w/ Ashlyn Bruning PA-C. I left my extension [phone] and requested that patient return my call, so that I may complete the nursing portion of this appointment.   ?

## 2022-03-06 NOTE — Telephone Encounter (Signed)
Patient did not answer the phone for today's appointment with Reather Littler, PA. Reached out to the patient's daughter Russella Dar to inform of her mother's rescheduled appointment for 5/17 at 10:30 am via telephone. No answer, LVM with appointment information. Placed appointment card in mail. ?

## 2022-03-06 NOTE — Telephone Encounter (Signed)
I left a voicemail reminding patient's daughter Gwenlyn Fudge of her mother's 10:00am-03/06/22 telephone appointment w/ Ashlyn Bruning PA-C. I left my extension [phone] and requested that patient return my call, so that I may complete the nursing portion of this appointment.   ?

## 2022-03-06 NOTE — Telephone Encounter (Signed)
Unable to reach patient for today's 03/06/22-10:00am telephone appointment w/ Ashlyn Bruning PA-C. Multiple voicemail's left for patient in regards to this appointment. ?

## 2022-03-11 ENCOUNTER — Encounter: Payer: Self-pay | Admitting: Urology

## 2022-03-11 NOTE — Progress Notes (Signed)
Telephone appointment. I spoke w/ patient's daughter Brooke Mora, verified her identity and began nursing interview. Brooke Mora reports patient Brooke Mora is experiencing some RT sided, upper back pain 2/10. Patient's pain has been recently improving. No other issues reported at this time. ? ?Meaningful use complete. ? ?Reminded Brooke Mora of her mother's 10:30am-03/12/22 telephone appointment w/ Ashlyn Bruning PA-C. I left my extension [phone] in case patient needs anything. Brooke Mora verbalized understanding of information. ? ?Patient preferred contact- [phone] -Daughter- Brooke Mora ?

## 2022-03-12 ENCOUNTER — Ambulatory Visit
Admission: RE | Admit: 2022-03-12 | Discharge: 2022-03-12 | Disposition: A | Discharge status: Home / Self Care | Payer: Medicaid Other | Source: Ambulatory Visit | Attending: Urology | Admitting: Urology

## 2022-03-12 DIAGNOSIS — C3411 Malignant neoplasm of upper lobe, right bronchus or lung: Secondary | ICD-10-CM | POA: Insufficient documentation

## 2022-03-12 NOTE — Progress Notes (Signed)
?Radiation Oncology         [phone] ?________________________________ ? ?Name: Brooke Mora MRN: [id number]  ?Date: 03/12/2022  DOB: 29-Mar-1929 ? ?Post Treatment Note ? ?CC: Benito Mccreedy, MD  Garner Nash, DO ? ?Diagnosis:   88 yo woman with an enlarging right upper lung mass with painful invasion of the rib/chest wall.  ? ?Interval Since Last Radiation:  5 weeks  ? 01/21/22 - 02/03/22:  The target in the RUL lung and right chest wall/4th rib were treated to 30 Gy in 10 fractions ? ?Narrative:  I spoke with the patient and her grand-daughter, Nellums, who serves as her medical interpreter, to conduct her routine scheduled 1 month follow up visit via telephone to spare the patient unnecessary potential exposure in the healthcare setting during the current COVID-19 pandemic.  The patient was notified in advance and gave permission to proceed with this visit format. ? ?She tolerated radiation treatment relatively well.  She did report modest fatigue and some shortness of breath with exertion that remained unchanged from her baseline.  She did not experience chest pain, hemoptysis, productive cough or dysphagia.                             ? ?On review of systems, obtained via her grand-daughter, Gregary Signs, the patient states that she is doing very well overall.  She is very pleased with the significant improvement in chest wall pain and is now off all narcotic pain medications.  At this point, she is only occasionally using Tylenol or Advil as needed.  She remains without complaints and specifically denies any increased shortness of breath, cough, hemoptysis, chest pain or dysphagia.  She has a good appetite and is maintaining her weight.  She denies any significant fatigue and overall, is quite pleased with her progress to date. ? ?ALLERGIES:  is allergic to nabumetone. ? ?Meds: ?Current Outpatient Medications  ?Medication Sig Dispense Refill  ? acetaminophen (TYLENOL) 500 MG tablet Take 1,000 mg by mouth  every 6 (six) hours as needed for moderate pain.    ? Diclofenac Sodium 3 % GEL Apply 1 application. topically 2 (two) times daily.    ? furosemide (LASIX) 20 MG tablet 1 tab(s)    ? gabapentin (NEURONTIN) 300 MG capsule Take 300 mg by mouth daily as needed (pain).    ? metoprolol succinate (TOPROL-XL) 25 MG 24 hr tablet Take 0.5 tablets (12.5 mg total) by mouth daily. 45 tablet 0  ? mirtazapine (REMERON) 15 MG tablet Take 30 mg by mouth at bedtime.    ? nitroGLYCERIN (NITROSTAT) 0.3 MG SL tablet 1 tab(s)    ? omeprazole (PRILOSEC) 20 MG capsule Take 20 mg by mouth daily.    ? oxyCODONE-acetaminophen (PERCOCET) 5-325 MG tablet Take 1 tablet by mouth every 6 (six) hours as needed for severe pain. 60 tablet 0  ? traMADol (ULTRAM) 50 MG tablet Take 50 mg by mouth every 6 (six) hours as needed. Twice daily    ? ?No current facility-administered medications for this encounter.  ? ? ?Physical Findings: ? vitals were not taken for this visit.  ?Pain Assessment ?Pain Score: 2  (RT upper back pain)/10 ?Unable to assess due to telephone follow-up visit format. ? ?Lab Findings: ?Lab Results  ?Component Value Date  ? WBC 11.9 (H) 12/04/2020  ? HGB 15.7 (H) 12/04/2020  ? HCT 49.0 (H) 12/04/2020  ? MCV 88.9 12/04/2020  ? PLT 319  12/04/2020  ? ? ? ?Radiographic Findings: ?No results found. ? ?Impression/Plan: ?86. 88 yo woman with an enlarging right upper lung mass with painful invasion of the rib/chest wall.  ?She appears to have recovered well from the effects of her recent palliative radiotherapy to the RUL lung and right chest wall.  She has had significant improvement in the chest wall pain which she is quite pleased with.  We discussed that while we are happy to continue to participate in her care if clinically indicated, at this point, we will plan to see her back on an as-needed basis.  She has a scheduled follow-up visit with Dr. Valeta Harms on 04/07/2022 so we will look forward to continuing to follow her progress via  correspondence.  They know that they are welcome to call at anytime with any questions or concerns related to her previous radiation. ? ? ? ?Nicholos Johns, PA-C  ?

## 2022-03-17 ENCOUNTER — Other Ambulatory Visit: Payer: Medicaid Other | Admitting: Hospice

## 2022-04-07 ENCOUNTER — Ambulatory Visit: Payer: Medicaid Other | Admitting: Pulmonary Disease

## 2022-04-07 ENCOUNTER — Encounter: Payer: Self-pay | Admitting: Pulmonary Disease

## 2022-04-07 VITALS — BP 158/76 | HR 62 | Temp 98.1°F | Ht 64.0 in | Wt 184.6 lb

## 2022-04-07 DIAGNOSIS — Z515 Encounter for palliative care: Secondary | ICD-10-CM

## 2022-04-07 DIAGNOSIS — R918 Other nonspecific abnormal finding of lung field: Secondary | ICD-10-CM | POA: Diagnosis not present

## 2022-04-07 NOTE — Progress Notes (Signed)
Synopsis: Referred in Feb 2023 for lung mass by Benito Mccreedy, MD  Subjective:   PATIENT ID: Brooke Mora GENDER: female DOB: [birth year], MRN: [id number]  Chief Complaint  Patient presents with   Follow-up    This is a 88 year old female here today for evaluation of abnormal chest imaging.  Patient's granddaughter is here to help communicate and translate.  Patient does understand some English however granddaughter is here and is a Personnel officer at [employer].  The patient has been seen here in our clinic before for these biapical masses.  They have been stable for some time however recent chest X imaging that was completed at primary care office had concern for progression.  Patient had apical cavitary disease in the right side dating back since 2017.  Patient had a bronchoscopy with BAL with cultures that were negative, AFB negative.  She even had PET scan imaging which revealed hypermetabolic uptake within some of these lesions.  I see no previous biopsy that was completed.Patient's initial CT imaging was in January 2017.  Had subsequent serial CT imaging through September 2018.  Patient had a repeat CT scan of the chest in July 2021.  This revealed stable lobulated irregular biapical masses with no progression of disease and stable hilar lymph nodes.  Due to ongoing back pain patient had a repeat chest x-ray at primary care which led to the referral today.  Also in 2022 in the emergency department patient had a repeat CT scan of the chest was the time she was diagnosed with COVID-19 at extensive bilateral groundglass opacities within the lung.  These series of images have been reviewed today.  The area of scarring within the apices had been the same this entire time.  Patient is a longtime smoker she quit several years ago however smoked at least a half a pack a day for 40+ years.  Also lives with her ex-husband who was a heavy smoker.  OV 01/01/2022: Patient had recent CT scan of  the chest and here for follow-up after that.See to chest completed on 12/26/2021 revealed significant enlargement of a right apical pleural-based mass now 5.7 cm with associated multiple parenchymal and subpleural nodules concerning for metastatic disease.  Additionally there is direct involvement of the chest wall from the right apical mass with bony destruction of the right fourth rib  OV 04/07/2022: Here today for follow-up after radiation treatments.  She did really well with these with radiation oncology.  She is no longer having any pain in the right fourth rib lesion area.  She is barely taking any tramadol once or twice a week.  She had outpatient palliative care come and visit with her.  Overall from respiratory standpoint she is able to complete all of her activities of daily living.  Wants to continue to be able to do this as long as she possibly can.  Agree with the ongoing outpatient palliative care.     Past Medical History:  Diagnosis Date   COPD (chronic obstructive pulmonary disease) (HCC)    slight    Emphysema lung (HCC)    GERD (gastroesophageal reflux disease)    High cholesterol    Hypertension    patient states b/p up and down not on meds   Kidney stones    SVT (supraventricular tachycardia) ([place])      Family History  Problem Relation Age of Onset   Diabetes Mother      Past Surgical History:  Procedure Laterality Date  ABDOMINAL HYSTERECTOMY     CATARACT EXTRACTION     HERNIA REPAIR     INCISIONAL HERNIA REPAIR N/A 07/18/2014   Procedure: REPAIR OF INACERATED INCISIONAL HERNIA WITH MESH;  Surgeon: Armandina Gemma, MD;  Location: [facility];  Service: General;  Laterality: N/A;   INSERTION OF MESH N/A 07/18/2014   Procedure: INSERTION OF MESH;  Surgeon: Armandina Gemma, MD;  Location: [facility];  Service: General;  Laterality: N/A;   KNEE SURGERY     VIDEO BRONCHOSCOPY Bilateral 11/05/2015   Procedure: VIDEO BRONCHOSCOPY WITHOUT FLUORO;  Surgeon: Javier Glazier, MD;   Location: [facility];  Service: Cardiopulmonary;  Laterality: Bilateral;    Social History   Socioeconomic History   Marital status: Widowed    Spouse name: Not on file   Number of children: Not on file   Years of education: Not on file   Highest education level: Not on file  Occupational History   Not on file  Tobacco Use   Smoking status: Former    Packs/day: 0.50    Years: 60.00    Total pack years: 30.00    Types: Cigarettes    Quit date: 10/27/2006    Years since quitting: 15.4   Smokeless tobacco: Never  Vaping Use   Vaping Use: Never used  Substance and Sexual Activity   Alcohol use: No    Alcohol/week: 0.0 standard drinks of alcohol   Drug use: No   Sexual activity: Not on file  Other Topics Concern   Not on file  Social History Narrative   Not on file   Social Determinants of Health   Financial Resource Strain: Not on file  Food Insecurity: Not on file  Transportation Needs: Not on file  Physical Activity: Not on file  Stress: Not on file  Social Connections: Not on file  Intimate Partner Violence: Not on file     Allergies  Allergen Reactions   Nabumetone Itching and Swelling     Outpatient Medications Prior to Visit  Medication Sig Dispense Refill   acetaminophen (TYLENOL) 500 MG tablet Take 1,000 mg by mouth every 6 (six) hours as needed for moderate pain.     Diclofenac Sodium 3 % GEL Apply 1 application. topically 2 (two) times daily.     furosemide (LASIX) 20 MG tablet 1 tab(s)     gabapentin (NEURONTIN) 300 MG capsule Take 300 mg by mouth daily as needed (pain).     mirtazapine (REMERON) 15 MG tablet Take 30 mg by mouth at bedtime.     nitroGLYCERIN (NITROSTAT) 0.3 MG SL tablet 1 tab(s)     omeprazole (PRILOSEC) 20 MG capsule Take 20 mg by mouth daily.     oxyCODONE-acetaminophen (PERCOCET) 5-325 MG tablet Take 1 tablet by mouth every 6 (six) hours as needed for severe pain. 60 tablet 0   traMADol (ULTRAM) 50 MG tablet Take 50 mg by mouth  every 6 (six) hours as needed. Twice daily     metoprolol succinate (TOPROL-XL) 25 MG 24 hr tablet Take 0.5 tablets (12.5 mg total) by mouth daily. 45 tablet 0   No facility-administered medications prior to visit.    Review of Systems  Constitutional:  Negative for chills, fever, malaise/fatigue and weight loss.  HENT:  Negative for hearing loss, sore throat and tinnitus.   Eyes:  Negative for blurred vision and double vision.  Respiratory:  Negative for cough, hemoptysis, sputum production, shortness of breath, wheezing and stridor.   Cardiovascular:  Negative for chest  pain, palpitations, orthopnea, leg swelling and PND.  Gastrointestinal:  Negative for abdominal pain, constipation, diarrhea, heartburn, nausea and vomiting.  Genitourinary:  Negative for dysuria, hematuria and urgency.  Musculoskeletal:  Positive for back pain. Negative for joint pain and myalgias.  Skin:  Negative for itching and rash.  Neurological:  Negative for dizziness, tingling, weakness and headaches.  Endo/Heme/Allergies:  Negative for environmental allergies. Does not bruise/bleed easily.  Psychiatric/Behavioral:  Negative for depression. The patient is not nervous/anxious and does not have insomnia.   All other systems reviewed and are negative.    Objective:  Physical Exam Vitals reviewed.  Constitutional:      General: She is not in acute distress.    Appearance: She is well-developed.  HENT:     Head: Normocephalic and atraumatic.  Eyes:     General: No scleral icterus.    Conjunctiva/sclera: Conjunctivae normal.     Pupils: Pupils are equal, round, and reactive to light.  Neck:     Vascular: No JVD.     Trachea: No tracheal deviation.  Cardiovascular:     Rate and Rhythm: Normal rate and regular rhythm.     Heart sounds: No murmur heard. Pulmonary:     Effort: Pulmonary effort is normal. No tachypnea, accessory muscle usage or respiratory distress.     Breath sounds: No stridor. No  wheezing, rhonchi or rales.     Comments: Diminished breath sounds bilaterally Abdominal:     Palpations: Abdomen is soft.  Musculoskeletal:        General: No tenderness.     Cervical back: Neck supple.  Lymphadenopathy:     Cervical: No cervical adenopathy.  Skin:    General: Skin is warm and dry.     Capillary Refill: Capillary refill takes less than 2 seconds.     Findings: No rash.  Neurological:     Mental Status: She is alert and oriented to person, place, and time.  Psychiatric:        Behavior: Behavior normal.      Vitals:   04/07/22 1505  BP: (!) 158/76  Pulse: 62  Temp: 98.1 F (36.7 C)  TempSrc: Oral  SpO2: 90%  Weight: 184 lb 9.6 oz (83.7 kg)  Height: 5\' 4"  (1.626 m)   90% on RA BMI Readings from Last 3 Encounters:  04/07/22 31.69 kg/m  01/06/22 31.93 kg/m  01/01/22 32.03 kg/m   Wt Readings from Last 3 Encounters:  04/07/22 184 lb 9.6 oz (83.7 kg)  01/06/22 186 lb (84.4 kg)  01/01/22 186 lb 9.6 oz (84.6 kg)     CBC    Component Value Date/Time   WBC 11.9 (H) 12/04/2020 0537   RBC 5.51 (H) 12/04/2020 0537   HGB 15.7 (H) 12/04/2020 0537   HCT 49.0 (H) 12/04/2020 0537   PLT 319 12/04/2020 0537   MCV 88.9 12/04/2020 0537   MCH 28.5 12/04/2020 0537   MCHC 32.0 12/04/2020 0537   RDW 12.7 12/04/2020 0537   LYMPHSABS 0.8 11/28/2020 0434   MONOABS 0.4 11/28/2020 0434   EOSABS 0.0 11/28/2020 0434   BASOSABS 0.0 11/28/2020 0434    Chest Imaging: Several CT imaging from 2017 through 2022 reviewed today in the office: Has areas of scarring and apical lobulated masses within both lungs. The patient's images have been independently reviewed by me.   12/26/2021 CT chest: Progressive enlargement of the right apical lesion now 5.7 cm in largest cross-section with associated metastatic disease to the right chest and multiple  pulmonary nodules.  This is concerning for an advanced age bronchogenic carcinoma. The patient's images have been independently  reviewed by me.    Pulmonary Functions Testing Results:    Latest Ref Rng & Units 12/21/2015    9:32 AM  PFT Results  FVC-Pre L 2.31   FVC-Predicted Pre % 91   FVC-Post L 2.34   FVC-Predicted Post % 92   Pre FEV1/FVC % % 77   Post FEV1/FCV % % 79   FEV1-Pre L 1.78   FEV1-Predicted Pre % 94   FEV1-Post L 1.86   TLC L 10.62   TLC % Predicted % 204   RV % Predicted % 329     FeNO:   Pathology:   Echocardiogram:   Heart Catheterization:     Assessment & Plan:     ICD-10-CM   1. Lung mass  R91.8     2. Multiple nodules of lung  R91.8     3. Hospice care patient  Z51.5       Discussion:  This is a 88 year old female, multiple pulmonary nodules large apical masslike density dating back since 2017.  Now with serial imaging has a apical lesion that is more round and solid in appearance.  CT scan now more aggressive with mass eroding into the posterior right rib.  Patient tolerated radiation treatments well.  This was treated empirically without tissue diagnosis.  Plan: If pain returns to the posterior rib or she has any additional bone pain potentially could entertain repeat radiation treatments. She needs to continue outpatient palliative care visits.  At some point in time may need to consider transition to outpatient hospice care based on symptom management.  But at this time she is able to complete most of her activities of daily living with very minimal input. Right now we will continue conservative management. Treat symptoms as they arise. Patient does not want to see medical oncology.  She would not want any chemotherapy treatments.    Current Outpatient Medications:    acetaminophen (TYLENOL) 500 MG tablet, Take 1,000 mg by mouth every 6 (six) hours as needed for moderate pain., Disp: , Rfl:    Diclofenac Sodium 3 % GEL, Apply 1 application. topically 2 (two) times daily., Disp: , Rfl:    furosemide (LASIX) 20 MG tablet, 1 tab(s), Disp: , Rfl:    gabapentin  (NEURONTIN) 300 MG capsule, Take 300 mg by mouth daily as needed (pain)., Disp: , Rfl:    mirtazapine (REMERON) 15 MG tablet, Take 30 mg by mouth at bedtime., Disp: , Rfl:    nitroGLYCERIN (NITROSTAT) 0.3 MG SL tablet, 1 tab(s), Disp: , Rfl:    omeprazole (PRILOSEC) 20 MG capsule, Take 20 mg by mouth daily., Disp: , Rfl:    oxyCODONE-acetaminophen (PERCOCET) 5-325 MG tablet, Take 1 tablet by mouth every 6 (six) hours as needed for severe pain., Disp: 60 tablet, Rfl: 0   traMADol (ULTRAM) 50 MG tablet, Take 50 mg by mouth every 6 (six) hours as needed. Twice daily, Disp: , Rfl:    metoprolol succinate (TOPROL-XL) 25 MG 24 hr tablet, Take 0.5 tablets (12.5 mg total) by mouth daily., Disp: 45 tablet, Rfl: 0   Garner Nash, DO [facility] 04/07/2022 3:11 PM

## 2022-04-07 NOTE — Patient Instructions (Signed)
Thank you for visiting Dr. Valeta Harms at [facility]. Today we recommend the following:  Continue outpatient palliative care management If symptoms increase we will need to make a decision for outpatient hospice care.   Return if symptoms worsen or fail to improve.    Please do your part to reduce the spread of COVID-19.

## 2022-05-27 ENCOUNTER — Other Ambulatory Visit: Payer: Self-pay | Admitting: Pulmonary Disease

## 2022-05-29 ENCOUNTER — Encounter: Payer: Self-pay | Admitting: Pulmonary Disease

## 2022-05-30 NOTE — Telephone Encounter (Signed)
Dr. Valeta Harms, please see mychart messages sent by pt's granddaughter and advise.

## 2022-06-04 MED ORDER — OXYCODONE-ACETAMINOPHEN 5-325 MG PO TABS: 1.0000 | ORAL_TABLET | Freq: Four times a day (QID) | ORAL | 0 refills | Status: DC | PRN

## 2022-06-09 ENCOUNTER — Other Ambulatory Visit: Payer: Medicaid Other | Admitting: Hospice

## 2022-06-09 DIAGNOSIS — Z515 Encounter for palliative care: Secondary | ICD-10-CM

## 2022-06-09 DIAGNOSIS — J439 Emphysema, unspecified: Secondary | ICD-10-CM

## 2022-06-09 DIAGNOSIS — R918 Other nonspecific abnormal finding of lung field: Secondary | ICD-10-CM

## 2022-06-09 DIAGNOSIS — M25511 Pain in right shoulder: Secondary | ICD-10-CM

## 2022-06-09 NOTE — Progress Notes (Signed)
[facility] Consult Note Telephone: [phone]  Fax: [phone]  PATIENT NAME: Brooke Mora [id number] [phone] (home)  DOB: 02/19/1929 MRN: [id number]  PRIMARY CARE PROVIDER:    Benito Mccreedy, MD,  [facility] [address] [place] [address] [phone]  REFERRING PROVIDER:   Dr. June Leap   RESPONSIBLE PARTY:  Self/[name]/Brooke Mora Contact Information     Name Relation Home Work Mobile   Brooke Mora Daughter [phone]  [phone]   Brooke Mora [phone]  [phone]      TELEHEALTH VISIT STATEMENT Due to the COVID-19 crisis, this visit was done via telemedicine from my office and it was initiated and consent by this patient and or family.  I connected with patient OR PROXY by a telephone/video  and verified that I am speaking with the correct person. I discussed the limitations of evaluation and management by telemedicine. Patient/proxy expressed understanding and agreed to proceed. Palliative Care was asked to follow this patient to address advance care planning, complex medical decision making and goals of care clarification.   Conference call with patient/Brooke Mora and Brooke Mora    ASSESSMENT AND / RECOMMENDATIONS:   CODE STATUS: DNR  Goals of Care: Goals include to maximize quality of life and symptom management  Visit consisted of counseling and education dealing with the complex and emotionally intense issues of symptom management and palliative care in the setting of serious and potentially life-threatening illness. Palliative care team will continue to support patient, patient's family, and medical team.  Symptom Management/Plan: Right shoulder pain: Patient taking only half dose instead of full dose, and reports pain not well managed. Education provided on taking as ordered: Take Oxycodone 5-325 mg one tablet by mouth every 6 hours as needed for  severe pain. Use of heating pad discussed.  Lung mass: Recently completed radiation. Followed by Oncology. Emphysema: Stable.  Avoid triggers.  Patient no longer smoking.  Encourage slow deep breathing.  Albuterol on hand, not using daily.  Follow up: Palliative care will continue to follow for complex medical decision making, advance care planning, and clarification of goals. Return 12 weeks or prn. Encouraged to call provider sooner with any concerns.   Family /Caregiver/Community Supports: Patient lives at home with her daughter and grand daughter.  Patient with limited English, understands but not able to speak fluently. Brooke Mora is the primary caregiver, speaks [place].  Strong family support system identified.  HOSPICE ELIGIBILITY/DIAGNOSIS: TBD  Chief Complaint:Follow up visit  HISTORY OF PRESENT ILLNESS:  Brooke Mora is a 88 y.o. year old female  with multiple morbidities requiring close monitoring and with high risk of complications and  mortality: COPD-emphysema, lung mass. History obtained from review of EMR, discussion with primary team, caregiver, family and/or [name].  Review and summarization of Epic records shows history from other than patient. Rest of 10 point ROS asked and negative.  I reviewed as needed, available labs, patient records, imaging, studies and related documents from the EMR.   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/2015   HTN (hypertension) 12/24/2015   GERD (gastroesophageal reflux disease) 12/24/2015   Nephrolithiasis 12/24/2015   COPD exacerbation ([place]) 12/24/2015   Former cigarette smoker 12/24/2015   Hyperglycemia 12/24/2015   Dyslipidemia 12/24/2015  Acute hypoxemic respiratory  failure due to COVID-19 [facility]) 11/20/2020   Hospice care patient 01/01/2022   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 ([place]) 01/05/2022   Arthritis of left knee 01/06/2022   History of infectious disease 01/06/2022   Hyperlipidemia, unspecified 01/06/2022   Hypertensive heart disease 01/06/2022   Lung field abnormal 01/06/2022   Neuropathy 01/06/2022   Peripheral venous insufficiency 01/06/2022   Prediabetes 01/06/2022   Psychophysiologic insomnia 01/06/2022   Standard chest x-ray abnormal 01/06/2022   History of tobacco use 01/06/2022   Resolved Ambulatory Problems    Diagnosis Date Noted   Incisional hernia, incarcerated 07/18/2014   Troponin level elevated 11/01/2015   CAP (community acquired pneumonia) 11/16/2015   Past Medical History:  Diagnosis Date   COPD (chronic obstructive pulmonary disease) ([place])    Emphysema lung (HCC)    High cholesterol    Hypertension    Kidney stones     SOCIAL HX:  Social History   Tobacco Use   Smoking status: Former    Packs/day: 0.50    Years: 60.00    Total pack years: 30.00    Types: Cigarettes    Quit date: 10/27/2006    Years since quitting: 15.6   Smokeless tobacco: Never  Substance Use Topics   Alcohol use: No    Alcohol/week: 0.0 standard drinks of alcohol     FAMILY HX:  Family History  Problem Relation Age of Onset   Diabetes Mother       ALLERGIES:  Allergies  Allergen Reactions   Nabumetone Itching and Swelling      PERTINENT MEDICATIONS:  Outpatient Encounter Medications as of 06/09/2022  Medication Sig   acetaminophen (TYLENOL) 500 MG tablet Take 1,000 mg by mouth every 6 (six) hours as needed for moderate pain.   Diclofenac Sodium 3 % GEL Apply 1 application. topically 2 (two) times daily.   furosemide (LASIX) 20 MG tablet 1 tab(s)   gabapentin (NEURONTIN) 300 MG capsule Take 300 mg by mouth daily as needed (pain).   metoprolol succinate (TOPROL-XL) 25 MG 24 hr tablet Take 0.5 tablets (12.5  mg total) by mouth daily.   mirtazapine (REMERON) 15 MG tablet Take 30 mg by mouth at bedtime.   nitroGLYCERIN (NITROSTAT) 0.3 MG SL tablet 1 tab(s)   omeprazole (PRILOSEC) 20 MG capsule Take 20 mg by mouth daily.   oxyCODONE-acetaminophen (PERCOCET) 5-325 MG tablet Take 1 tablet by mouth every 6 (six) hours as needed for severe pain.   traMADol (ULTRAM) 50 MG tablet Take 50 mg by mouth every 6 (six) hours as needed. Twice daily   No facility-administered encounter medications on file as of 06/09/2022.    I spent 40 minutes providing this consultation; this includes time spent with patient/family, chart review and documentation. More than 50% of the time in this consultation was spent on counseling and coordinating communication   Thank you for the opportunity to participate in the care of [name].  The palliative care team will continue to follow. Please call our office at [phone] if we can be of additional assistance.   Note: Portions of this note were generated with [employer]. Dictation errors may occur despite best attempts at proofreading.  Teodoro Spray, NP

## 2022-07-01 ENCOUNTER — Other Ambulatory Visit: Payer: Self-pay | Admitting: Pulmonary Disease

## 2022-07-01 ENCOUNTER — Encounter: Payer: Self-pay | Admitting: Pulmonary Disease

## 2022-07-01 DIAGNOSIS — Z515 Encounter for palliative care: Secondary | ICD-10-CM

## 2022-07-01 DIAGNOSIS — C3490 Malignant neoplasm of unspecified part of unspecified bronchus or lung: Secondary | ICD-10-CM

## 2022-07-01 MED ORDER — OXYCODONE-ACETAMINOPHEN 10-325 MG PO TABS: 1.0000 | ORAL_TABLET | Freq: Three times a day (TID) | ORAL | 0 refills | Status: DC | PRN

## 2022-07-01 NOTE — Telephone Encounter (Signed)
Should be getting refills from hospice care? Can we make sure hospice and palliative are involved. If not, I can refill to help cover until then.  Thanks BLI

## 2022-07-01 NOTE — Telephone Encounter (Signed)
Orders placed. I increased the dose to Percocet 10/325. I have also placed a referral to hospice care. Hopefully hospice will be able to help take over day-to-day management of symptom control.  [facility] 07/01/2022 1:56 PM

## 2022-07-01 NOTE — Telephone Encounter (Signed)
Dr. Valeta Harms, please see pt email regarding Percocet refill. Thanks.   Last filled: 06/04/2022 Filled for #60 with 0 refills Last seen on 04/07/22

## 2022-07-01 NOTE — Telephone Encounter (Signed)
Please advise on med refill. 

## 2022-07-09 ENCOUNTER — Encounter: Payer: Self-pay | Admitting: Pulmonary Disease

## 2022-07-11 ENCOUNTER — Telehealth: Payer: Self-pay

## 2022-07-11 ENCOUNTER — Other Ambulatory Visit (HOSPITAL_COMMUNITY): Payer: Self-pay

## 2022-07-11 NOTE — Telephone Encounter (Signed)
Patient is requesting PA for this medication

## 2022-07-11 NOTE — Telephone Encounter (Signed)
Prior [employer] sent by pt stating that insurance is requiring a PA for pt's Percocet. Routing to prior auth team for review.

## 2022-07-11 NOTE — Telephone Encounter (Signed)
Patient Advocate Encounter   Received notification that prior authorization is required for oxyCODONE-Acetaminophen 10-325MG  tablets.  PA submitted and APPROVED on 07-11-2022.  Key  [id number] Effective: 07-11-2022 - 01-07-2023

## 2022-07-14 ENCOUNTER — Other Ambulatory Visit (HOSPITAL_COMMUNITY): Payer: Self-pay

## 2022-08-11 ENCOUNTER — Encounter: Payer: Self-pay | Admitting: Pulmonary Disease

## 2022-08-18 ENCOUNTER — Encounter: Payer: Self-pay | Admitting: Pulmonary Disease

## 2022-08-19 ENCOUNTER — Telehealth: Payer: Self-pay | Admitting: Radiation Oncology

## 2022-08-19 NOTE — Telephone Encounter (Signed)
10/24 @ 1:11 pm f/u call.  Left voicemail for patient's granddaughter to call us back to schedule patient for consult with Ashlyn.

## 2022-08-19 NOTE — Telephone Encounter (Signed)
10/24 @ 10:28 am Patient's granddaughter (caregiver) would like a call back after 12:00 pm to schedule patient for consult.

## 2022-08-20 ENCOUNTER — Telehealth: Payer: Self-pay | Admitting: Radiation Oncology

## 2022-08-20 NOTE — Telephone Encounter (Signed)
10/25 @ 9:00 am spoke to patient's granddaughter.  She confirmed to go a head an scheduled patient with Ashlyn.  Let her know everything will reflected in patient's mychart.

## 2022-08-20 NOTE — Progress Notes (Signed)
  Radiation Oncology         [phone] ________________________________  Name: Brooke Mora MRN: [id number]  Date: 08/22/2022  DOB: June 10, 1929  SIMULATION AND TREATMENT PLANNING NOTE    ICD-10-CM   1. 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   NARRATIVE:  The patient was brought to the [facility].  Identity was confirmed.  All relevant records and images related to the planned course of therapy were reviewed.  The patient freely provided informed written consent to proceed with treatment after reviewing the details related to the planned course of therapy. The consent form was witnessed and verified by the simulation staff.  Then, the patient was set-up in a stable reproducible  supine position for radiation therapy.  CT images were obtained.  Surface markings were placed.  The CT images were loaded into the planning software.  Then the target and avoidance structures were contoured.  Treatment planning then occurred.  The radiation prescription was entered and confirmed.  Then, I designed and supervised the construction of a total of 6 medically necessary complex treatment devices, including an immobilization mold custom fitted to the patient along with multiple multileaf collimators conformally shaped radiation around the treatment target while shielding critical structures such as the heart and spinal cord maximally.  I have requested : 3D Simulation  I have requested a DVH of the following structures: Left lung, right lung, spinal cord, heart, esophagus, and target.  SPECIAL TREATMENT PROCEDURE:  The planned course of therapy using radiation constitutes a special treatment procedure. Special care is required in the management of this patient for the following reasons. This treatment constitutes a Special Treatment Procedure for the following  reason: [ Retreatment in a previously radiated area requiring careful monitoring of increased risk of toxicity due to overlap of previous treatment..  The special nature of the planned course of radiotherapy will require increased physician supervision and oversight to ensure patient's safety with optimal treatment outcomes.  This will require extended time and effort from me.  PLAN:  The patient will receive 30 Gy in 10 fractions.  ________________________________  Sheral Apley Tammi Klippel, M.D.

## 2022-08-22 ENCOUNTER — Encounter: Payer: Self-pay | Admitting: Urology

## 2022-08-22 ENCOUNTER — Ambulatory Visit
Admission: RE | Admit: 2022-08-22 | Discharge: 2022-08-22 | Disposition: A | Discharge status: Home / Self Care | Payer: Medicaid Other | Source: Ambulatory Visit | Attending: Radiation Oncology | Admitting: Radiation Oncology

## 2022-08-22 ENCOUNTER — Ambulatory Visit
Admission: RE | Admit: 2022-08-22 | Discharge: 2022-08-22 | Disposition: A | Discharge status: Home / Self Care | Payer: Medicaid Other | Source: Ambulatory Visit | Attending: Urology | Admitting: Urology

## 2022-08-22 ENCOUNTER — Ambulatory Visit: Payer: Medicaid Other | Admitting: Radiation Oncology

## 2022-08-22 ENCOUNTER — Other Ambulatory Visit: Payer: Self-pay

## 2022-08-22 VITALS — BP 128/73 | HR 95 | Temp 96.7°F | Resp 18 | Ht 64.0 in | Wt 166.5 lb

## 2022-08-22 DIAGNOSIS — Z923 Personal history of irradiation: Secondary | ICD-10-CM | POA: Insufficient documentation

## 2022-08-22 DIAGNOSIS — Z510 Encounter for antineoplastic radiation therapy: Secondary | ICD-10-CM | POA: Diagnosis not present

## 2022-08-22 DIAGNOSIS — C3411 Malignant neoplasm of upper lobe, right bronchus or lung: Secondary | ICD-10-CM

## 2022-08-22 DIAGNOSIS — C7951 Secondary malignant neoplasm of bone: Secondary | ICD-10-CM | POA: Diagnosis not present

## 2022-08-22 DIAGNOSIS — Z79899 Other long term (current) drug therapy: Secondary | ICD-10-CM | POA: Insufficient documentation

## 2022-08-22 NOTE — Progress Notes (Signed)
Radiation Oncology         [phone] ________________________________  Name: Brooke Mora MRN: [id number]  Date: 08/22/2022  DOB: [birth year]  Outpatient Follow-up Note  CC: Benito Mccreedy, MD  Garner Nash, DO  Diagnosis:   88 yo woman with an enlarging right upper lung mass with painful invasion of the rib/chest wall.   Interval Since Last Radiation:  6 months   01/21/22 - 02/03/22:  The target in the RUL lung and right chest wall/4th rib were treated to 30 Gy in 10 fractions  Narrative:  I spoke with the patient and her grand-daughter, Heaton, who serves as her medical interpreter. She reports that she tolerated the previous radiation treatment well aside from some modest fatigue and some shortness of breath with exertion that remained unchanged from her baseline.  She had significant improvement in the chest wall pain and was able to come off all narcotic pain medications, only occasionally using Tylenol or Advil as needed.  However, more recently, she began developing a progressive return of pain, similar to what she had prior to treatment in 01/2022. The pain has been gradually progressing and becoming less responsive to her pain medications over the past 2 months.                         On review of systems, obtained via her grand-daughter, Herrle, the patient states that she is doing fair in general.  Her only complaint is of severe right posterior chest wall/rib pain requiring Percocet 10/325 mg TID.  She denies any increased shortness of breath, cough, hemoptysis, chest pain or dysphagia.  She has a decent appetite and is maintaining her weight. She denies any new musculoskeletal or joint aches or pains aside from the chest wall/ribs, no new skin lesions or other concerns. A complete review of systems is obtained and is otherwise negative.  ALLERGIES:  is allergic to nabumetone.  Meds: Current Outpatient Medications  Medication Sig Dispense Refill   Diclofenac Sodium 3  % GEL Apply 1 application. topically 2 (two) times daily.     furosemide (LASIX) 20 MG tablet 1 tab(s)     gabapentin (NEURONTIN) 300 MG capsule Take 300 mg by mouth daily as needed (pain).     metoprolol succinate (TOPROL-XL) 25 MG 24 hr tablet Take 0.5 tablets (12.5 mg total) by mouth daily. 45 tablet 0   mirtazapine (REMERON) 15 MG tablet Take 30 mg by mouth at bedtime.     nitroGLYCERIN (NITROSTAT) 0.3 MG SL tablet 1 tab(s)     omeprazole (PRILOSEC) 20 MG capsule Take 20 mg by mouth daily.     oxyCODONE-acetaminophen (PERCOCET) 10-325 MG tablet Take 1 tablet by mouth every 8 (eight) hours as needed for pain. 120 tablet 0   No current facility-administered medications for this encounter.    Physical Findings:  height is 5\' 4"  (1.626 m) and weight is 166 lb 8 oz (75.5 kg). Her temporal temperature is 96.7 F (35.9 C) (abnormal). Her blood pressure is 128/73 and her pulse is 95. Her respiration is 18 and oxygen saturation is 92%.  Pain Assessment Pain Score: 8  (RT upper posterior flank pain)/10 In general this is a well appearing hispanic female in no acute distress. She's alert and oriented x4 and appropriate throughout the examination. Cardiopulmonary assessment is negative for acute distress and she exhibits normal effort.    Lab Findings: Lab Results  Component Value Date   WBC 11.9 (  H) 12/04/2020   HGB 15.7 (H) 12/04/2020   HCT 49.0 (H) 12/04/2020   MCV 88.9 12/04/2020   PLT 319 12/04/2020     Radiographic Findings: No results found.  Impression/Plan: 70. 88 yo woman with an enlarging right upper lung mass with painful invasion of the rib/chest wall.  Today, we talked to the patient and her grand-daughter, Nulty, about the findings and workup thus far. We discussed the natural history of metastatic carcinoma and general treatment, highlighting the role of palliative radiotherapy in the management. We discussed the available radiation techniques, and focused on the details  and logistics of delivery.  The recommendation is to retreat the involved right posterior chest wall/ribs with a 2-week course of daily palliative radiotherapy.  We reviewed the anticipated acute and late sequelae associated with radiation in this setting. The patient was encouraged to ask questions that were answered to her stated satisfaction.   At the conclusion of our conversation, she is interested in proceeding with the recommended 2-week course of daily palliative reirradiation.  She has freely signed written consent to proceed today in the office and a copy of this document will be placed in her medical record.  She is scheduled for CT simulation/treatment planning following our visit today in anticipation of beginning her daily treatments in the near future.  She and her granddaughter, Slagel, know that they are welcome to call at anytime with any questions or concerns related to the radiotherapy.  We will share our discussion with Dr. Valeta Harms and proceed with treatment planning accordingly.   We personally spent 45 minutes in this encounter including chart review, reviewing radiological studies, meeting face-to-face with the patient, entering orders and completing documentation.    Nicholos Johns, PA-C    Tyler Pita, MD  [facility] Direct Dial: [phone]  Fax: [phone] [email or web address]  Skype  LinkedIn    Page Me

## 2022-08-22 NOTE — Progress Notes (Signed)
Reconsult nursing interview for 88 yo woman with an enlarging right upper lung mass with painful invasion of the rib/chest wall.  I verified patient's identity and began nursing interview w/ patient's granddaughter Ms. Leronda Lewers in attendance. Patient reports upper RT posterior flank pain 8/10, and fatigue. No other issues reported at this time.  Meaningful use complete.  BP 128/73 (BP Location: Left Arm, Patient Position: Sitting, Cuff Size: Normal)   Pulse 95   Temp (!) 96.7 F (35.9 C) (Temporal)   Resp 18   Ht 5\' 4"  (1.626 m)   Wt 166 lb 8 oz (75.5 kg)   SpO2 92%   BMI 28.58 kg/m   This patient is a fall risk.

## 2022-08-29 DIAGNOSIS — Z510 Encounter for antineoplastic radiation therapy: Secondary | ICD-10-CM | POA: Insufficient documentation

## 2022-08-29 DIAGNOSIS — C3411 Malignant neoplasm of upper lobe, right bronchus or lung: Secondary | ICD-10-CM | POA: Diagnosis not present

## 2022-08-30 ENCOUNTER — Other Ambulatory Visit: Payer: Self-pay | Admitting: Pulmonary Disease

## 2022-09-01 ENCOUNTER — Ambulatory Visit: Payer: Medicaid Other

## 2022-09-01 MED ORDER — OXYCODONE-ACETAMINOPHEN 10-325 MG PO TABS: 1.0000 | ORAL_TABLET | Freq: Three times a day (TID) | ORAL | 0 refills | Status: DC | PRN

## 2022-09-01 NOTE — Telephone Encounter (Signed)
Hey your doc of the day, this pt lov was 04/07/22 with Dr. Valeta Harms. Was recommended to call if needed. Would you like to refill prescription ? Please advise

## 2022-09-02 ENCOUNTER — Ambulatory Visit: Payer: Medicaid Other

## 2022-09-03 ENCOUNTER — Ambulatory Visit: Payer: Medicaid Other

## 2022-09-04 ENCOUNTER — Ambulatory Visit: Payer: Medicaid Other

## 2022-09-05 ENCOUNTER — Ambulatory Visit: Payer: Medicaid Other

## 2022-09-07 ENCOUNTER — Encounter: Payer: Self-pay | Admitting: Pulmonary Disease

## 2022-09-07 DIAGNOSIS — Z515 Encounter for palliative care: Secondary | ICD-10-CM

## 2022-09-08 ENCOUNTER — Ambulatory Visit
Admission: RE | Admit: 2022-09-08 | Discharge: 2022-09-08 | Disposition: A | Discharge status: Home / Self Care | Payer: Medicaid Other | Source: Ambulatory Visit | Attending: Radiation Oncology | Admitting: Radiation Oncology

## 2022-09-08 ENCOUNTER — Other Ambulatory Visit: Payer: Self-pay

## 2022-09-08 DIAGNOSIS — Z510 Encounter for antineoplastic radiation therapy: Secondary | ICD-10-CM | POA: Diagnosis not present

## 2022-09-08 LAB — RAD ONC ARIA SESSION SUMMARY
Course Elapsed Days: 0
Plan Fractions Treated to Date: 1
Plan Prescribed Dose Per Fraction: 3 Gy
Plan Total Fractions Prescribed: 10
Plan Total Prescribed Dose: 30 Gy
Reference Point Dosage Given to Date: 3 Gy
Reference Point Session Dosage Given: 3 Gy
Session Number: 1

## 2022-09-08 MED ORDER — OXYCODONE-ACETAMINOPHEN 10-325 MG PO TABS: 1.0000 | ORAL_TABLET | Freq: Four times a day (QID) | ORAL | 0 refills | Status: DC | PRN

## 2022-09-08 NOTE — Telephone Encounter (Addendum)
1 week supply was sent because the provider filing it who was on call because Dr. Valeta Harms was out has not seen the patient in 2 years.  You can do partial refill if noted in the prescription. I will fill 60 tablets. She has not been seen since June. Is she seeing palliative care or hospice? Further refill will need to come from Dr. Valeta Harms.

## 2022-09-08 NOTE — Telephone Encounter (Signed)
Received the following message from patient.  "Hello and good afternoon not sure why the oxycodone was only filled for 7 days but I work at a doctors office as well and I know the pharmacy does not honor the prescription for the additional month if a partial is picked up. Grandma has terminal cancer and 21 tablets are not close to enough for the month. Her PA was approved for 120 not sure why a partial was sent. Please advise she's got enough medicine for tonight"  Brooke Mora, can you please advise since Dr. Valeta Harms is not available today? Thanks!

## 2022-09-09 ENCOUNTER — Other Ambulatory Visit: Payer: Self-pay

## 2022-09-09 ENCOUNTER — Ambulatory Visit
Admission: RE | Admit: 2022-09-09 | Discharge: 2022-09-09 | Disposition: A | Discharge status: Home / Self Care | Payer: Medicaid Other | Source: Ambulatory Visit | Attending: Radiation Oncology | Admitting: Radiation Oncology

## 2022-09-09 DIAGNOSIS — Z510 Encounter for antineoplastic radiation therapy: Secondary | ICD-10-CM | POA: Diagnosis not present

## 2022-09-09 LAB — RAD ONC ARIA SESSION SUMMARY
Course Elapsed Days: 1
Plan Fractions Treated to Date: 2
Plan Prescribed Dose Per Fraction: 3 Gy
Plan Total Fractions Prescribed: 10
Plan Total Prescribed Dose: 30 Gy
Reference Point Dosage Given to Date: 6 Gy
Reference Point Session Dosage Given: 3 Gy
Session Number: 2

## 2022-09-10 ENCOUNTER — Ambulatory Visit
Admission: RE | Admit: 2022-09-10 | Discharge: 2022-09-10 | Disposition: A | Discharge status: Home / Self Care | Payer: Medicaid Other | Source: Ambulatory Visit | Attending: Radiation Oncology | Admitting: Radiation Oncology

## 2022-09-10 ENCOUNTER — Other Ambulatory Visit: Payer: Self-pay

## 2022-09-10 DIAGNOSIS — Z510 Encounter for antineoplastic radiation therapy: Secondary | ICD-10-CM | POA: Diagnosis not present

## 2022-09-10 LAB — RAD ONC ARIA SESSION SUMMARY
Course Elapsed Days: 2
Plan Fractions Treated to Date: 3
Plan Prescribed Dose Per Fraction: 3 Gy
Plan Total Fractions Prescribed: 10
Plan Total Prescribed Dose: 30 Gy
Reference Point Dosage Given to Date: 9 Gy
Reference Point Session Dosage Given: 3 Gy
Session Number: 3

## 2022-09-10 NOTE — Telephone Encounter (Signed)
Dr. Valeta Harms, please review the mychart messages sent.

## 2022-09-11 ENCOUNTER — Ambulatory Visit
Admission: RE | Admit: 2022-09-11 | Discharge: 2022-09-11 | Disposition: A | Discharge status: Home / Self Care | Payer: Medicaid Other | Source: Ambulatory Visit | Attending: Radiation Oncology | Admitting: Radiation Oncology

## 2022-09-11 ENCOUNTER — Other Ambulatory Visit: Payer: Self-pay

## 2022-09-11 DIAGNOSIS — Z510 Encounter for antineoplastic radiation therapy: Secondary | ICD-10-CM | POA: Diagnosis not present

## 2022-09-11 LAB — RAD ONC ARIA SESSION SUMMARY
Course Elapsed Days: 3
Plan Fractions Treated to Date: 4
Plan Prescribed Dose Per Fraction: 3 Gy
Plan Total Fractions Prescribed: 10
Plan Total Prescribed Dose: 30 Gy
Reference Point Dosage Given to Date: 12 Gy
Reference Point Session Dosage Given: 3 Gy
Session Number: 4

## 2022-09-12 ENCOUNTER — Ambulatory Visit
Admission: RE | Admit: 2022-09-12 | Discharge: 2022-09-12 | Disposition: A | Discharge status: Home / Self Care | Payer: Medicaid Other | Source: Ambulatory Visit | Attending: Radiation Oncology | Admitting: Radiation Oncology

## 2022-09-12 ENCOUNTER — Other Ambulatory Visit: Payer: Self-pay

## 2022-09-12 ENCOUNTER — Other Ambulatory Visit: Payer: Self-pay | Admitting: Radiation Oncology

## 2022-09-12 DIAGNOSIS — Z510 Encounter for antineoplastic radiation therapy: Secondary | ICD-10-CM | POA: Diagnosis not present

## 2022-09-12 LAB — RAD ONC ARIA SESSION SUMMARY
Course Elapsed Days: 4
Plan Fractions Treated to Date: 5
Plan Prescribed Dose Per Fraction: 3 Gy
Plan Total Fractions Prescribed: 10
Plan Total Prescribed Dose: 30 Gy
Reference Point Dosage Given to Date: 15 Gy
Reference Point Session Dosage Given: 3 Gy
Session Number: 5

## 2022-09-12 MED ORDER — OXYCODONE HCL ER 15 MG PO T12A: 15.0000 mg | EXTENDED_RELEASE_TABLET | Freq: Two times a day (BID) | ORAL | 0 refills | Status: DC

## 2022-09-12 NOTE — Telephone Encounter (Signed)
I filled a short term supple of her pain medication. Dr. Valeta Harms I recommending hospice consult who can mange palliative needs.

## 2022-09-15 ENCOUNTER — Other Ambulatory Visit: Payer: Self-pay

## 2022-09-15 ENCOUNTER — Ambulatory Visit
Admission: RE | Admit: 2022-09-15 | Discharge: 2022-09-15 | Disposition: A | Discharge status: Home / Self Care | Payer: Medicaid Other | Source: Ambulatory Visit | Attending: Radiation Oncology | Admitting: Radiation Oncology

## 2022-09-15 ENCOUNTER — Telehealth: Payer: Self-pay | Admitting: Radiation Oncology

## 2022-09-15 DIAGNOSIS — Z510 Encounter for antineoplastic radiation therapy: Secondary | ICD-10-CM | POA: Diagnosis not present

## 2022-09-15 DIAGNOSIS — C3411 Malignant neoplasm of upper lobe, right bronchus or lung: Secondary | ICD-10-CM

## 2022-09-15 LAB — RAD ONC ARIA SESSION SUMMARY
Course Elapsed Days: 7
Plan Fractions Treated to Date: 6
Plan Prescribed Dose Per Fraction: 3 Gy
Plan Total Fractions Prescribed: 10
Plan Total Prescribed Dose: 30 Gy
Reference Point Dosage Given to Date: 18 Gy
Reference Point Session Dosage Given: 3 Gy
Session Number: 6

## 2022-09-15 NOTE — Telephone Encounter (Signed)
I called and left the patient's daughter a VM letting her know we were working on prior authorization for her mothers medication. Referral to palliative care was recommended by Dr. Tammi Klippel and orders placed for goals of care and ongoing symptom management for metastatic lung cancer.

## 2022-09-16 ENCOUNTER — Ambulatory Visit
Admission: RE | Admit: 2022-09-16 | Discharge: 2022-09-16 | Disposition: A | Discharge status: Home / Self Care | Payer: Medicaid Other | Source: Ambulatory Visit | Attending: Radiation Oncology | Admitting: Radiation Oncology

## 2022-09-16 ENCOUNTER — Telehealth: Payer: Self-pay

## 2022-09-16 ENCOUNTER — Other Ambulatory Visit: Payer: Self-pay

## 2022-09-16 DIAGNOSIS — Z510 Encounter for antineoplastic radiation therapy: Secondary | ICD-10-CM | POA: Diagnosis not present

## 2022-09-16 LAB — RAD ONC ARIA SESSION SUMMARY
Course Elapsed Days: 8
Plan Fractions Treated to Date: 7
Plan Prescribed Dose Per Fraction: 3 Gy
Plan Total Fractions Prescribed: 10
Plan Total Prescribed Dose: 30 Gy
Reference Point Dosage Given to Date: 21 Gy
Reference Point Session Dosage Given: 3 Gy
Session Number: 7

## 2022-09-16 NOTE — Telephone Encounter (Signed)
Prior authorization for pain medication Oxycontin 15 mg has been approved as of 09/16/2022 and is good until February 2024.  Shona Simpson, PA-C and Dr. Tammi Klippel made aware.  Nothing follows.

## 2022-09-17 ENCOUNTER — Ambulatory Visit: Payer: Medicaid Other

## 2022-09-17 ENCOUNTER — Other Ambulatory Visit: Payer: Self-pay

## 2022-09-17 ENCOUNTER — Ambulatory Visit
Admission: RE | Admit: 2022-09-17 | Discharge: 2022-09-17 | Disposition: A | Discharge status: Home / Self Care | Payer: Medicaid Other | Source: Ambulatory Visit | Attending: Radiation Oncology | Admitting: Radiation Oncology

## 2022-09-17 DIAGNOSIS — Z510 Encounter for antineoplastic radiation therapy: Secondary | ICD-10-CM | POA: Diagnosis not present

## 2022-09-17 LAB — RAD ONC ARIA SESSION SUMMARY
Course Elapsed Days: 9
Plan Fractions Treated to Date: 8
Plan Prescribed Dose Per Fraction: 3 Gy
Plan Total Fractions Prescribed: 10
Plan Total Prescribed Dose: 30 Gy
Reference Point Dosage Given to Date: 24 Gy
Reference Point Session Dosage Given: 3 Gy
Session Number: 8

## 2022-09-18 ENCOUNTER — Ambulatory Visit: Payer: Medicaid Other

## 2022-09-19 ENCOUNTER — Ambulatory Visit: Payer: Medicaid Other

## 2022-09-22 ENCOUNTER — Other Ambulatory Visit: Payer: Self-pay

## 2022-09-22 ENCOUNTER — Ambulatory Visit
Admission: RE | Admit: 2022-09-22 | Discharge: 2022-09-22 | Disposition: A | Discharge status: Home / Self Care | Payer: Medicaid Other | Source: Ambulatory Visit | Attending: Radiation Oncology | Admitting: Radiation Oncology

## 2022-09-22 DIAGNOSIS — Z510 Encounter for antineoplastic radiation therapy: Secondary | ICD-10-CM | POA: Diagnosis not present

## 2022-09-22 LAB — RAD ONC ARIA SESSION SUMMARY
Course Elapsed Days: 14
Plan Fractions Treated to Date: 9
Plan Prescribed Dose Per Fraction: 3 Gy
Plan Total Fractions Prescribed: 10
Plan Total Prescribed Dose: 30 Gy
Reference Point Dosage Given to Date: 27 Gy
Reference Point Session Dosage Given: 3 Gy
Session Number: 9

## 2022-09-23 ENCOUNTER — Other Ambulatory Visit: Payer: Self-pay

## 2022-09-23 ENCOUNTER — Encounter: Payer: Self-pay | Admitting: Urology

## 2022-09-23 ENCOUNTER — Ambulatory Visit
Admission: RE | Admit: 2022-09-23 | Discharge: 2022-09-23 | Disposition: A | Discharge status: Home / Self Care | Payer: Medicaid Other | Source: Ambulatory Visit | Attending: Radiation Oncology | Admitting: Radiation Oncology

## 2022-09-23 DIAGNOSIS — Z510 Encounter for antineoplastic radiation therapy: Secondary | ICD-10-CM | POA: Diagnosis not present

## 2022-09-23 LAB — RAD ONC ARIA SESSION SUMMARY
Course Elapsed Days: 15
Plan Fractions Treated to Date: 10
Plan Prescribed Dose Per Fraction: 3 Gy
Plan Total Fractions Prescribed: 10
Plan Total Prescribed Dose: 30 Gy
Reference Point Dosage Given to Date: 30 Gy
Reference Point Session Dosage Given: 3 Gy
Session Number: 10

## 2022-10-03 NOTE — Progress Notes (Signed)
                                                                                                                                                             Patient Name: Brooke Mora MRN: [id number] DOB: 1929/02/14 Referring Physician: Benito Mccreedy (Profile Not Attached) Date of Service: 09/23/2022 [facility], [place]                                                        End Of Treatment Note  Diagnoses: C34.11-Malignant neoplasm of upper lobe, right bronchus or lung  Cancer Staging: 88 yo woman with right upper lung cancer with painful invasion of the rib/chest wall.    Intent: Curative  Radiation Treatment Dates: 09/08/2022 through 09/23/2022 Site Technique Total Dose (Gy) Dose per Fx (Gy) Completed Fx Beam Energies  Lung, Right: Lung_R 3D 30/30 3 10/10 6X, 10X   Narrative: The patient tolerated radiation therapy relatively well without any ill side effects.  Plan: The patient will receive a call in about one month from the radiation oncology department. She will continue follow up with Dr. Valeta Harms as well.   Nicholos Johns, PA-C    Tyler Pita, MD  [facility] Direct Dial: [phone]  Fax: [phone] [email or web address]  Skype  LinkedIn

## 2022-10-21 ENCOUNTER — Ambulatory Visit
Admission: RE | Admit: 2022-10-21 | Discharge: 2022-10-21 | Disposition: A | Discharge status: Home / Self Care | Payer: Medicaid Other | Source: Ambulatory Visit | Attending: Radiation Oncology | Admitting: Radiation Oncology

## 2022-10-21 DIAGNOSIS — C3411 Malignant neoplasm of upper lobe, right bronchus or lung: Secondary | ICD-10-CM | POA: Insufficient documentation

## 2022-10-21 DIAGNOSIS — Z510 Encounter for antineoplastic radiation therapy: Secondary | ICD-10-CM | POA: Insufficient documentation

## 2022-10-21 NOTE — Progress Notes (Addendum)
  Radiation Oncology         [phone] ________________________________  Name: Brooke Mora MRN: [id number]  Date of Service: 10/21/2022  DOB: 19-Sep-1929  Post Treatment Telephone Note  Diagnosis:  88 yo woman with right upper lung cancer with painful invasion of the rib/chest wall.    Intent: Curative  Radiation Treatment Dates: 09/08/2022 through 09/23/2022 Site Technique Total Dose (Gy) Dose per Fx (Gy) Completed Fx Beam Energies  Lung, Right: Lung_R 3D 30/30 3 10/10 6X, 10X   (as documented in provider EOT note)   The patient was not available for call today. A voicemail was left.   The patient has not scheduled a follow up with her medical oncologist Dr. Valeta Harms for ongoing care, and was encouraged to call if she develops concerns or questions regarding radiation. Dr. Valeta Harms has been notified that this patient needs a follow up appointment w/ him.   Leandra Kern, LPN

## 2022-10-22 ENCOUNTER — Ambulatory Visit: Payer: Medicaid Other | Admitting: Urology

## 2022-10-22 ENCOUNTER — Ambulatory Visit: Payer: Self-pay | Admitting: Urology

## 2022-10-24 ENCOUNTER — Other Ambulatory Visit: Payer: Medicaid Other

## 2022-10-24 DIAGNOSIS — Z515 Encounter for palliative care: Secondary | ICD-10-CM

## 2022-10-24 NOTE — Progress Notes (Signed)
COMMUNITY PALLIATIVE CARE SW NOTE  PATIENT NAME: LATARSHIA JERSEY DOB: 27-Jun-1929 MRN: [id number]  PRIMARY CARE PROVIDER: Benito Mccreedy, MD  RESPONSIBLE PARTY:  Acct ID - Guarantor Home Phone Work Phone Relationship Acct Type  [id number] [name]  Self P/F     [address], [place], [place] [address]   Palliative Care Encounter/Clinical Social Work (11:16 am -11:25 am)   Person (s) encountered: Patient's granddaughter, Fomby  Purpose of the Visit: Follow-up on patient status and assess needs and comfort  Assessment: LCSW completed a review and assessment of patient's family/social, mental health and medical history, allergies, medications, and health (functional) status, including patient self-reporting and a review of relevant consultative reports was completed today as part of a comprehensive evaluation by [facility].  Summary of Encounter: PC SW completed a telephonic encounter with patient's granddaughter, who provided a status update on patient. She report that patient is stable overall. She is getting up and ambulating independently. She requires assistance for personal care needs. She is increased sleeping. Her appetite is fair, but she has had some weight loss. Her granddaughter report a 20 lb weight loss in three months. Her current weight is 159 lbs. She is eating more softer foods and she is only eating about 25% of a small dish meal. Patient is using o2 (4L) more as her o2 sats have intermittently dropped to the mid 80's. Patient sats have been stable with continuous o2 use. Patient has declined any further cancer treatments. SW scheduled a face to face visit with the palliative care RN.   Social Work Interventions Provided: chart review, assessment of patient needs, comfort and status; reassurance of support, supportive counseling, re-enforced how to access palliative support, and scheduled a follow-up  visit with palliative care RN.  Collaboration/Coordination of Care: Palliative care RN updated for follow-up. Spanish-speaking [facility] community educator will join team for this visit.   Plan/Follow-up: RN/SW to see patient on 10/28/22@ 10 am.       SOCIAL HX: N/A Social History   Tobacco Use   Smoking status: Former    Packs/day: 0.50    Years: 60.00    Total pack years: 30.00    Types: Cigarettes    Quit date: 10/27/2006    Years since quitting: 16.0   Smokeless tobacco: Never  Substance Use Topics   Alcohol use: No    Alcohol/week: 0.0 standard drinks of alcohol   CODE STATUS: DNR ADVANCED DIRECTIVES: No MOST FORM COMPLETE:  No HOSPICE EDUCATION PROVIDED: Yes, introduction  Duration of encounter and documentation: 30 minutes.  [address] [place], [place]

## 2022-10-28 ENCOUNTER — Other Ambulatory Visit: Payer: Medicaid Other

## 2022-10-28 DIAGNOSIS — Z515 Encounter for palliative care: Secondary | ICD-10-CM

## 2022-10-28 NOTE — Progress Notes (Signed)
[facility] Encounter  PATIENT NAME: Brooke Mora DOB: Mar 27, 1929 MRN: [id number]   PRIMARY CARE PROVIDER: Benito Mccreedy, MD               RN/Spanish Interpreter, (Marla Minoso) team completed follow up visit in her home. Daughter also present.   History of Present Illness: 89yo former smoker with right upper lung mass with invasion of rib/chest wall. Pt reports via medical interpreter that she takes Tramadol and Oxycodone for pain. Takes both meds, 1 pill of each twice daily.  Cognitive: Pt is alert and oriented x 3. Answers questions appropriately and contributes to conversation between daughter and interpreter.   Appetite: Daughter reports that pt eats "a good breakfast and lunch and lite dinner." Also reports that she has 1/2 of an Ensure daily. Encouraged pt to continue to eat well and focus on protein for strength. Daughter reports that pt also snacks between meals and really likes fruit.  Mobility: Pt was walking around room making her bed when RN arrived. Steady on feet and denies any falls in recent past. Has a walker to use when she leaves the house or feels weak.   GI/GU: Pt wears depends "just in case" but has good sensation of when she needs to void and is rarely incontinent.  Pain: Pt denies pain at present. Reports that she randomly has right sided rib and flank pain. States, " I never know when it is going to hurt, but when it does, it hurts terribly bad and makes me very anxious." Pt reports that she takes one tramadol and one oxycodone in the mornings and at night regardless of presence of pain. Also reports that she uses a heating pad when side hurts which does seem to help. RN encouraged pt to talk with doctor regarding instruction to take pain meds when not having pain. Voiced understanding.   Goals of Care: Pt wants to return to [place] in near future to visit family. Pt wants to remain at home as long as she can. Daughter and pt both unsure of future  treatment plans. Pt has underwent two rounds of 15 radiation treatments for a total of 30 treatments.     CODE STATUS: DNR- form on file ADVANCED DIRECTIVES: N/A MOST FORM: N/A PPS:   Next Appt Scheduled For: 11/26/22 at 230pm with interpreter present.        PHYSICAL EXAM:          LUNGS:  diminished on right side. Clear on left. Pt reports occasional non productive cough.  CARDIAC:  Regular EXTREMITIES: Normal SKIN: No issues noted. Daughter reports that pt had rash and redness from radiation to back right side of neck but it resolved shortly after last radiation treatment.  NEURO: Alert and oriented x 3.    Jacqulyn Cane, RN

## 2022-11-18 ENCOUNTER — Other Ambulatory Visit: Payer: Self-pay | Admitting: Radiation Oncology

## 2022-11-18 NOTE — Progress Notes (Signed)
Received inbasket message that Mrs. Onalee Hua requested refill for Oxycodone.  Contacted palliative care nurse since Mrs. Avarez is on palliative care now for medication refill nurse Barbette Merino, RN she wasn't able to assist with this patient that we would need to reach out to outpatient provider. Mrs. Wamser doesn't have an outside oncologist noted in her chart.   Ashlyn Bruning, PA-C was made aware and agrees to that palliative care should fill order. Will contact cancer center palliative care staff Willette Alma to see if they can assist.

## 2022-11-19 ENCOUNTER — Inpatient Hospital Stay: Payer: Medicaid Other | Attending: Nurse Practitioner | Admitting: Nurse Practitioner

## 2022-11-19 ENCOUNTER — Other Ambulatory Visit (HOSPITAL_COMMUNITY): Payer: Self-pay

## 2022-11-19 ENCOUNTER — Telehealth: Payer: Self-pay

## 2022-11-19 ENCOUNTER — Other Ambulatory Visit: Payer: Self-pay

## 2022-11-19 ENCOUNTER — Encounter: Payer: Self-pay | Admitting: Nurse Practitioner

## 2022-11-19 VITALS — BP 132/81 | HR 104 | Temp 98.0°F | Resp 20 | Ht 64.0 in | Wt 148.8 lb

## 2022-11-19 DIAGNOSIS — R630 Anorexia: Secondary | ICD-10-CM | POA: Diagnosis not present

## 2022-11-19 DIAGNOSIS — G893 Neoplasm related pain (acute) (chronic): Secondary | ICD-10-CM

## 2022-11-19 DIAGNOSIS — Z515 Encounter for palliative care: Secondary | ICD-10-CM

## 2022-11-19 DIAGNOSIS — C3411 Malignant neoplasm of upper lobe, right bronchus or lung: Secondary | ICD-10-CM

## 2022-11-19 DIAGNOSIS — R634 Abnormal weight loss: Secondary | ICD-10-CM

## 2022-11-19 DIAGNOSIS — Z7189 Other specified counseling: Secondary | ICD-10-CM

## 2022-11-19 MED ORDER — OXYCODONE HCL ER 15 MG PO T12A: 15.0000 mg | EXTENDED_RELEASE_TABLET | Freq: Two times a day (BID) | ORAL | 0 refills | Status: DC | PRN

## 2022-11-19 MED ORDER — OXYCODONE-ACETAMINOPHEN 10-325 MG PO TABS
1.0000 | ORAL_TABLET | Freq: Four times a day (QID) | ORAL | 0 refills | Status: DC | PRN
  Filled 2022-11-19: qty 60, 15d supply, fill #0

## 2022-11-19 MED ORDER — OXYCODONE HCL ER 15 MG PO T12A
15.0000 mg | EXTENDED_RELEASE_TABLET | Freq: Two times a day (BID) | ORAL | 0 refills | Status: DC | PRN
  Filled 2022-11-19 – 2022-11-20 (×3): qty 60, 30d supply, fill #0

## 2022-11-19 NOTE — Progress Notes (Unsigned)
[place]  [phone] Fax:(336) [phone]   Name: Brooke Mora Date: 11/19/2022 MRN: [id number]  DOB: 05-03-1929  Patient Care Team: Brooke Mccreedy, MD as PCP - General (Internal Medicine) Brooke Blanks, MD as PCP - Cardiology (Cardiology)    REASON FOR CONSULTATION: Brooke Mora is a 89 y.o. 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  Palliative ask to see for symptom and pain management and goals of care. She is actively being followed by [facility]'s palliative team in the home.    SOCIAL HISTORY:     reports that she quit smoking about 16 years ago. Her smoking use included cigarettes. She has a 30.00 pack-year smoking history. She has never used smokeless tobacco. She reports that she does not drink alcohol and does not use drugs.  ADVANCE DIRECTIVES:  DNR on file only  CODE STATUS: DNR  PAST MEDICAL HISTORY: Past Medical History:  Diagnosis Date   COPD (chronic obstructive pulmonary disease) ([place])    slight    Emphysema lung ([facility])    GERD (gastroesophageal reflux disease)    High cholesterol    Hypertension    patient states b/p up and down not on meds   Kidney stones    SVT (supraventricular tachycardia)     PAST SURGICAL HISTORY:  Past Surgical History:  Procedure Laterality Date   ABDOMINAL HYSTERECTOMY     CATARACT EXTRACTION     HERNIA REPAIR     INCISIONAL HERNIA REPAIR N/A 07/18/2014   Procedure: REPAIR OF INACERATED INCISIONAL HERNIA WITH MESH;  Surgeon: Brooke Gemma, MD;  Location: [facility];  Service: General;  Laterality: N/A;   INSERTION OF MESH N/A 07/18/2014   Procedure: INSERTION OF MESH;  Surgeon: Brooke Gemma, MD;  Location: [facility];  Service: General;  Laterality: N/A;   KNEE SURGERY     VIDEO BRONCHOSCOPY Bilateral 11/05/2015   Procedure: VIDEO BRONCHOSCOPY WITHOUT FLUORO;  Surgeon:  Brooke Glazier, MD;  Location: [facility];  Service: Cardiopulmonary;  Laterality: Bilateral;    HEMATOLOGY/ONCOLOGY HISTORY:  Oncology History   No history exists.    ALLERGIES:  is allergic to nabumetone.  MEDICATIONS:  Current Outpatient Medications  Medication Sig Dispense Refill   Diclofenac Sodium 3 % GEL Apply 1 application. topically 2 (two) times daily.     furosemide (LASIX) 20 MG tablet 1 tab(s)     gabapentin (NEURONTIN) 300 MG capsule Take 300 mg by mouth daily as needed (pain).     metoprolol succinate (TOPROL-XL) 25 MG 24 hr tablet Take 0.5 tablets (12.5 mg total) by mouth daily. 45 tablet 0   mirtazapine (REMERON) 15 MG tablet Take 30 mg by mouth at bedtime.     nitroGLYCERIN (NITROSTAT) 0.3 MG SL tablet 1 tab(s)     omeprazole (PRILOSEC) 20 MG capsule Take 20 mg by mouth daily.     oxyCODONE (OXYCONTIN) 15 mg 12 hr tablet Take 1 tablet (15 mg total) by mouth every 12 (twelve) hours as needed for up to 14 days (as needed for severe pain). 28 tablet 0   oxyCODONE-acetaminophen (PERCOCET) 10-325 MG tablet Take 1 tablet by mouth every 6 (six) hours as needed for pain. Further refills to be filled by Dr. Valeta Harms 60 tablet 0   No current facility-administered medications for this visit.    VITAL SIGNS: There were no vitals taken for this visit. There were no  vitals filed for this visit.  Estimated body mass index is 28.58 kg/m as calculated from the following:   Height as of 08/22/22: 5\' 4"  (1.626 m).   Weight as of 08/22/22: 166 lb 8 oz (75.5 kg).  LABS: CBC:    Component Value Date/Time   WBC 11.9 (H) 12/04/2020 0537   HGB 15.7 (H) 12/04/2020 0537   HCT 49.0 (H) 12/04/2020 0537   PLT 319 12/04/2020 0537   MCV 88.9 12/04/2020 0537   NEUTROABS 7.9 (H) 11/28/2020 0434   LYMPHSABS 0.8 11/28/2020 0434   MONOABS 0.4 11/28/2020 0434   EOSABS 0.0 11/28/2020 0434   BASOSABS 0.0 11/28/2020 0434   Comprehensive Metabolic Panel:    Component Value Date/Time    NA 140 12/04/2020 0537   NA 143 04/28/2019 1534   K 4.1 12/04/2020 0537   CL 96 (L) 12/04/2020 0537   CO2 31 12/04/2020 0537   BUN 46 (H) 12/04/2020 0537   BUN 19 04/28/2019 1534   CREATININE 1.03 (H) 12/04/2020 0537   GLUCOSE 137 (H) 12/04/2020 0537   CALCIUM 8.2 (L) 12/04/2020 0537   AST 21 11/28/2020 0434   ALT 21 11/28/2020 0434   ALKPHOS 56 11/28/2020 0434   BILITOT 0.8 11/28/2020 0434   PROT 6.5 11/28/2020 0434   ALBUMIN 3.3 (L) 11/28/2020 0434    RADIOGRAPHIC STUDIES: No results found.  PERFORMANCE STATUS (ECOG) : 1 - Symptomatic but completely ambulatory  Review of Systems  Constitutional:  Positive for fatigue.  Musculoskeletal:  Positive for arthralgias.  Unless otherwise noted, a complete review of systems is negative.  Physical Exam General: NAD, ambulatory without assistive devices  Cardiovascular: regular rate and rhythm Pulmonary: clear ant fields Abdomen: soft, nontender, + bowel sounds Extremities: no edema, no joint deformities Skin: no rashes, right ear abrasion  Neurological:AAO x3 with interpretation   IMPRESSION: This is my initial visit with [name]. She presents to clinic today with her granddaughter, Brooke Mora. No acute distress noted. Spanish speaking. Alert and able to engage in discussions appropriately with interpretation.   I introduced myself, [place] RN, and Palliative's role in collaboration with the oncology team. Concept of Palliative Care was introduced as specialized medical care for people and their families living with serious illness.  It focuses on providing relief from the symptoms and stress of a serious illness.  The goal is to improve quality of life for both the patient and the family. Values and goals of care important to patient and family were attempted to be elicited.   [name] lives in the home with her daughter, granddaughter, and her wife. She has 3 children. Originally from [place], [place]. She moved to the  [place] in the late 80s. She is retired from [employer].   In the home patient is ambulatory. Able to perform most ADLs independently. Is active with occasional rest breaks due to fatigue. She wears a CPAP at night. Has a right ear tension abrasion. Education provided to family on providing skin support in that area to prevent worsening of area. Some dyspnea on exertion. She has PRN oxygen at home.   Neoplasm/Chronic Pain  Ms. Fess complains of ongoing pain. Her pain is located in back, chest wall, and shoulder area. Pain worsens with certain activities. Nothing makes pain better other than rest and medications. Describes as constant ache/throb.   We discussed pain regimen. Ms. Stanislawski has been taking Oxycodone ER 15 mg every 12 hours and oxycodone/acetaminophen 10/325 mg every 6 hours as needed  for breakthrough pain. She does not require breakthrough medication around the clock. Takes 2-3 times daily. Patient reports pain at it's worst is 8-9/10 and decreases to 3/10 with medication.   Given pain is well controlled on current regimen. No changes. Refills will be sent to pharmacy.   Decreased appetite Family shares patient's appetite fluctuates. Some days are better than others. She is drinking protein drinks for support. Family encourages patient to eat however allows her to eat as tolerating.   Current weight 148lbs. Family endorses some weight loss.   Will continue to monitor and support.   Goals of Care   We discussed her current illness and what it means in the larger context of Her on-going co-morbidities. Natural disease trajectory and expectations were discussed.  Ms. Sorn quality of life is good. Her family shares appreciation of how well she is doing given age and health concerns. They are clear in expressed wishes to continue taking life one day at a time, treat the treatable, manage her symptoms, allowing her every opportunity to continue thriving.   They are not  interested in hospice support at this time. She is actively followed in the home with [facility]'s palliative program.   I discussed the importance of continued conversation with family and their medical providers regarding overall plan of care and treatment options, ensuring decisions are within the context of the patients values and GOCs.  PLAN: Established therapeutic relationship. Education provided on palliative's role in collaboration with their Oncology/Radiation team. Oxycontin 15mg  every 12 hours Percocet 10/325 mg every 6 hours as needed for breakthrough pain.  Goals of care discussions. Patient is actively followed by [facility]'s home palliative program.  I will plan to see patient back in 2-4 weeks. Family knows to call sooner if needed.    Patient expressed understanding and was in agreement with this plan. She also understands that She can call the clinic at any time with any questions, concerns, or complaints.   Thank you for your referral and allowing Palliative to assist in Mrs. Kimberlynn L Glymph's care.   Number and complexity of problems addressed: HIGH - 1 or more chronic illnesses with SEVERE exacerbation, progression, or side effects of treatment - advanced cancer, pain. Any controlled substances utilized were prescribed in the context of palliative care.  Time Total: 50 min   Visit consisted of counseling and education dealing with the complex and emotionally intense issues of symptom management and palliative care in the setting of serious and potentially life-threatening illness.Greater than 50%  of this time was spent counseling and coordinating care related to the above assessment and plan.  Signed by: Alda Lea, AGPCNP-BC Palliative Medicine Team/[address] [place]

## 2022-11-19 NOTE — Telephone Encounter (Signed)
RN followed up with Alda Lea, NP this morning about Ms. Grahn and she's willing to prescribe pain medication just need a referral the earliest she can see her is tomorrow 11/20/2022 otherwise 12/01/2022 is her next opening her nurse will be reaching out to her today.  I sent Lexine Baton the referral for pain management of [name].  Because Ms. Gaona is only receiving palliative care with [facility] and since she's not a hospice patient [facility] cannot prescribe pain medications.

## 2022-11-20 ENCOUNTER — Other Ambulatory Visit (HOSPITAL_COMMUNITY): Payer: Self-pay

## 2022-11-25 ENCOUNTER — Other Ambulatory Visit: Payer: Medicaid Other

## 2022-11-26 ENCOUNTER — Other Ambulatory Visit: Payer: Medicaid Other

## 2022-11-27 ENCOUNTER — Other Ambulatory Visit: Payer: Medicaid Other

## 2022-11-27 DIAGNOSIS — Z515 Encounter for palliative care: Secondary | ICD-10-CM

## 2022-12-02 NOTE — Progress Notes (Signed)
PATIENT NAME: Brooke Mora DOB: 03-30-1929 MRN: [id number]  PRIMARY CARE PROVIDER: Benito Mccreedy, MD  RESPONSIBLE PARTY:  Acct ID - Guarantor Home Phone Work Phone Relationship Acct Type  [id number] [name]681 [phone]  Self P/F     [place], [place], [place] [address]     Palliative Care Telephone Encounter  Called patient on her home phone and Granddaughter's phone. No answer. Will call back to set up another visit        Julian Medina Georgann Housekeeper, LPN

## 2022-12-03 ENCOUNTER — Encounter: Payer: Self-pay | Admitting: Pulmonary Disease

## 2022-12-05 ENCOUNTER — Telehealth: Payer: Self-pay

## 2022-12-05 DIAGNOSIS — Z515 Encounter for palliative care: Secondary | ICD-10-CM

## 2022-12-05 NOTE — Telephone Encounter (Signed)
Palliative Care Nurse attempted to call pt's daughter but the phone went directly to voicemail.

## 2022-12-09 ENCOUNTER — Other Ambulatory Visit: Payer: Medicaid Other

## 2022-12-09 VITALS — BP 122/84 | HR 82 | Temp 97.9°F

## 2022-12-09 DIAGNOSIS — Z515 Encounter for palliative care: Secondary | ICD-10-CM

## 2022-12-09 NOTE — Progress Notes (Signed)
PATIENT NAME: Brooke Mora DOB: 22-Sep-1929 MRN: [id number]  PRIMARY CARE PROVIDER: Benito Mccreedy, MD  RESPONSIBLE PARTY:  Acct ID - Guarantor Home Phone Work Phone Relationship Acct Type  [id number] [name]  Self P/F     [place], [place], [place] [address]  Palliative Care Follow Up Encounter Note   Completed visit with [name], SW. Daughter [name] also present and translates from [place] to [place]  GI/GU: applies a pad in her underwear for protection; uses Miralax and only has occasional constipation   Cardiac: BLE edema; encouraged pt to keep her feet elevated when sitting in a chair in the bedroom   Cognitive: has good memory   Appetite: 3 reduced meals daily plus snacks; drinks 6-8oz glasses of water per day    Mobility: walks without assist but holds onto walls on occasion; has walker but does not use unless she is out in public  Respiratory: no issues at this time  Sleeping Pattern: sleeps 10+hrs per night  Pain: no pain at this time; but will take 1/2 Oxycodone tablet when she has pain which is about 2-3 times per week at this time   Goals of Care: [name] will care for [name] in the home "until she goes with Jesus".  CODE STATUS: DNR  ADVANCED DIRECTIVES: N MOST FORM: N PPS: 50%   PHYSICAL EXAM:   VITALS: Today's Vitals   12/09/22 1100  BP: 122/84  Pulse: 82  Temp: 97.9 F (36.6 C)  TempSrc: Temporal  SpO2: 92%  PainSc: 0-No pain    LUNGS: clear to auscultation  CARDIAC: Cor RRR EXTREMITIES: has edema to BLE ankles SKIN: Skin color, texture, turgor normal. No rashes or lesions  NEURO: negative       Margareta Laureano Georgann Housekeeper, LPN

## 2022-12-09 NOTE — Progress Notes (Signed)
COMMUNITY PALLIATIVE CARE SW NOTE  PATIENT NAME: Brooke Mora DOB: [birth year] MRN: [id number]  PRIMARY CARE PROVIDER: Benito Mccreedy, MD  RESPONSIBLE PARTY:  Acct ID - Guarantor Home Phone Work Phone Relationship Acct Type  [id number] [name]  Self P/F     [place], [place], [place] [address]   SOCIAL WORK FOLLOW-UP VISIT  SW and LPN-D. Georgann Housekeeper completed a follow-up visit with patient at her home. Her daughter-[name] was present with patient. Patient was sitting in a chair in her room. She was alert and oriented x3, she was in a good mood. Patient remains very independent. She completes all ADL's independently. She is eating 3 meals a day, but smaller in size. She also has snacks. Patient weight is 149 lbs. Patient is able to go out with daughter to run errands and go to medical appointments. Patient has pain to her lower back 2 to 3x week due to cancer. She takes a 1/2 hydrocodone which manages the pain and a heating pad. Patient sleeps well. Patient denies shortness of breath. Patient ambulates independently occasionally holding on to the wall or furniture. Patient has a walker and only uses it outside of the house. Patient wears a pad for urinary tinkles. She has occasional constipation, which is managed well with Miralax. SW provided a sign DNR for the home. DNR documented in [place], but no form was in the home.   Goal: Patient desires to remain in her home through the end-of-life  SOCIAL HX: Patient was born and raised in [place] and have been in [place] for last 25 years. Patient is widow. Patient has three children, 2 sons and a daughter. Patient's daughter lives with patient and serves as her primary caregiver. Patient is Catholic by faith.  Social History   Tobacco Use   Smoking status: Former    Packs/day: 0.50    Years: 60.00    Total pack years: 30.00    Types: Cigarettes    Quit date: 10/27/2006    Years since quitting: 16.1   Smokeless tobacco: Never   Substance Use Topics   Alcohol use: No    Alcohol/week: 0.0 standard drinks of alcohol    CODE STATUS: DNR, form left in the home ADVANCED DIRECTIVES: No MOST FORM COMPLETE:  No HOSPICE EDUCATION PROVIDED: No  Duration of visit and documentation: 60 minutes  Next Appt: 01/20/23 @ 11am.   [name], LCSW

## 2022-12-17 ENCOUNTER — Other Ambulatory Visit (HOSPITAL_COMMUNITY): Payer: Self-pay

## 2022-12-17 ENCOUNTER — Encounter: Payer: Self-pay | Admitting: Nurse Practitioner

## 2022-12-17 ENCOUNTER — Inpatient Hospital Stay: Payer: Medicaid Other | Attending: Nurse Practitioner | Admitting: Nurse Practitioner

## 2022-12-17 DIAGNOSIS — R530 Neoplastic (malignant) related fatigue: Secondary | ICD-10-CM

## 2022-12-17 DIAGNOSIS — C3411 Malignant neoplasm of upper lobe, right bronchus or lung: Secondary | ICD-10-CM | POA: Diagnosis not present

## 2022-12-17 DIAGNOSIS — G893 Neoplasm related pain (acute) (chronic): Secondary | ICD-10-CM

## 2022-12-17 DIAGNOSIS — Z515 Encounter for palliative care: Secondary | ICD-10-CM | POA: Diagnosis not present

## 2022-12-17 DIAGNOSIS — R630 Anorexia: Secondary | ICD-10-CM

## 2022-12-17 MED ORDER — OXYCODONE HCL ER 15 MG PO T12A
15.0000 mg | EXTENDED_RELEASE_TABLET | Freq: Two times a day (BID) | ORAL | 0 refills | Status: DC | PRN
  Filled 2022-12-19: qty 60, 30d supply, fill #0

## 2022-12-17 NOTE — Progress Notes (Signed)
[place]  Telephone:([phone] Fax:(336) [phone]   Name: Brooke Mora Date: 12/17/2022 MRN: [id number]  DOB: February 05, 1929  Patient Care Team: Brooke Mccreedy, MD as PCP - General (Internal Medicine) Brooke Blanks, MD as PCP - Cardiology (Cardiology)   I connected with Brooke Mora on 12/17/22 at 12:00 PM EST by Phone and verified that I am speaking with the correct person using two identifiers.   I discussed the limitations, risks, security and privacy concerns of performing an evaluation and management service by telemedicine and the availability of in-person appointments. I also discussed with the patient that there may be a patient responsible charge related to this service. The patient expressed understanding and agreed to proceed.   Other persons participating in the visit and their role in the encounter: Brooke Mora, Granddaughter   Patient's location: Home   Provider's location: [place]    INTERVAL HISTORY: Brooke Mora is a 89 y.o. 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  Palliative ask to see for symptom and pain management and goals of care. She is actively being followed by [facility]'s palliative team in the home.    SOCIAL HISTORY:     reports that she quit smoking about 16 years ago. Her smoking use included cigarettes. She has a 30.00 pack-year smoking history. She has never used smokeless tobacco. She reports that she does not drink alcohol and does not use drugs.  ADVANCE DIRECTIVES:  DNR on file  CODE STATUS: DNR  PAST MEDICAL HISTORY: Past Medical History:  Diagnosis Date   COPD (chronic obstructive pulmonary disease) ([place])    slight    Emphysema lung ([facility])    GERD (gastroesophageal reflux disease)    High cholesterol    Hypertension    patient states b/p up and down not on meds   Kidney  stones    SVT (supraventricular tachycardia)     ALLERGIES:  is allergic to nabumetone.  MEDICATIONS:  Current Outpatient Medications  Medication Sig Dispense Refill   Diclofenac Sodium 3 % GEL Apply 1 application. topically 2 (two) times daily.     furosemide (LASIX) 20 MG tablet 1 tab(s)     gabapentin (NEURONTIN) 300 MG capsule Take 300 mg by mouth daily as needed (pain).     metoprolol succinate (TOPROL-XL) 25 MG 24 hr tablet Take 0.5 tablets (12.5 mg total) by mouth daily. 45 tablet 0   mirtazapine (REMERON) 15 MG tablet Take 30 mg by mouth at bedtime.     nitroGLYCERIN (NITROSTAT) 0.3 MG SL tablet 1 tab(s)     omeprazole (PRILOSEC) 20 MG capsule Take 20 mg by mouth daily.     oxyCODONE (OXYCONTIN) 15 mg 12 hr tablet Take 1 tablet (15 mg total) by mouth every 12 (twelve) hours as needed (as needed for severe pain). 60 tablet 0   oxyCODONE-acetaminophen (PERCOCET) 10-325 MG tablet Take 1 tablet by mouth every 6 (six) hours as needed for pain. 60 tablet 0   No current facility-administered medications for this visit.    VITAL SIGNS: There were no vitals taken for this visit. There were no vitals filed for this visit.  Estimated body mass index is 25.54 kg/m as calculated from the following:   Height as of 11/19/22: 5\' 4"  (1.626 m).   Weight as of 11/19/22: 148 lb 12.8 oz (67.5 kg).   PERFORMANCE STATUS (ECOG) :  1 - Symptomatic but completely ambulatory   IMPRESSION: I connected with patient's granddaughter, Brooke Mora for follow-up on symptom management. No acute distress identified. Patient is doing as well as expected. Denies nausea, vomiting, constipation, or diarrhea.   Neoplasm/Chronic Pain  Brooke Mora's pain is well controlled on current regimen. Tolerating without difficulty.    We discussed pain regimen. Brooke Mora has been taking Oxycodone ER 15 mg every 12 hours and oxycodone/acetaminophen 10/325 mg every 6 hours as needed for breakthrough pain. She does not require  breakthrough medication around the clock. Takes 2-3 times daily.   Given pain is well controlled on current regimen. No changes. Refills will be sent to pharmacy.    Decreased appetite Is taking things one day at a time. Some days are better than others. Family is trying to feed patient's foods that are appealing and desired by her.    Goals of Care  1/24: We discussed Her current illness and what it means in the larger context of Her on-going co-morbidities. Natural disease trajectory and expectations were discussed. Brooke Mora quality of life is good. Her family shares appreciation of how well she is doing given age and health concerns. They are clear in expressed wishes to continue taking life one day at a time, treat the treatable, manage her symptoms, allowing her every opportunity to continue thriving. They are not interested in hospice support at this time. She is actively followed in the home with [facility]'s palliative program.   I discussed the importance of continued conversation with family and their medical providers regarding overall plan of care and treatment options, ensuring decisions are within the context of the patients values and GOCs.  PLAN:  Oxycontin 15mg  every 12 hours Percocet 10/325 mg every 6 hours as needed for breakthrough pain. Does not take around the clock.  Goals of care discussions. Patient is actively followed by [facility]'s home palliative program.  I will plan to see patient back in 6-8 weeks. Family knows to call sooner if needed.   Patient expressed understanding and was in agreement with this plan. She also understands that She can call the clinic at any time with any questions, concerns, or complaints.   Any controlled substances utilized were prescribed in the context of palliative care. PDMP has been reviewed.   Time Total: 20 min   Visit consisted of counseling and education dealing with the complex and emotionally intense issues of  symptom management and palliative care in the setting of serious and potentially life-threatening illness.Greater than 50%  of this time was spent counseling and coordinating care related to the above assessment and plan.  Brooke Mora, AGPCNP-BC  Palliative Medicine Team/[place]

## 2022-12-19 ENCOUNTER — Other Ambulatory Visit: Payer: Self-pay

## 2022-12-19 ENCOUNTER — Other Ambulatory Visit (HOSPITAL_COMMUNITY): Payer: Self-pay

## 2022-12-19 ENCOUNTER — Ambulatory Visit: Payer: Medicaid Other | Admitting: Pulmonary Disease

## 2022-12-19 ENCOUNTER — Encounter: Payer: Self-pay | Admitting: Pulmonary Disease

## 2022-12-19 VITALS — BP 100/70 | HR 95 | Ht 64.0 in | Wt 141.2 lb

## 2022-12-19 DIAGNOSIS — R0602 Shortness of breath: Secondary | ICD-10-CM

## 2022-12-19 DIAGNOSIS — Z87891 Personal history of nicotine dependence: Secondary | ICD-10-CM | POA: Diagnosis not present

## 2022-12-19 DIAGNOSIS — C3490 Malignant neoplasm of unspecified part of unspecified bronchus or lung: Secondary | ICD-10-CM

## 2022-12-19 DIAGNOSIS — Z515 Encounter for palliative care: Secondary | ICD-10-CM | POA: Diagnosis not present

## 2022-12-19 DIAGNOSIS — R918 Other nonspecific abnormal finding of lung field: Secondary | ICD-10-CM | POA: Diagnosis not present

## 2022-12-19 NOTE — Addendum Note (Signed)
Addended by: Alvin Critchley on: 12/19/2022 04:46 PM   Modules accepted: Orders

## 2022-12-19 NOTE — Progress Notes (Signed)
Synopsis: Referred in Feb 2023 for lung mass by Benito Mccreedy, MD  Subjective:   PATIENT ID: Brooke Mora GENDER: female DOB: 05-21-1929, MRN: [id number]  Chief Complaint  Patient presents with   Follow-up    Needs poc    This is a 89 year old female here today for evaluation of abnormal chest imaging.  Patient's granddaughter is here to help communicate and translate.  Patient does understand some English however granddaughter is here and is a Personnel officer at [employer].  The patient has been seen here in our clinic before for these biapical masses.  They have been stable for some time however recent chest X imaging that was completed at primary care office had concern for progression.  Patient had apical cavitary disease in the right side dating back since 2017.  Patient had a bronchoscopy with BAL with cultures that were negative, AFB negative.  She even had PET scan imaging which revealed hypermetabolic uptake within some of these lesions.  I see no previous biopsy that was completed.Patient's initial CT imaging was in January 2017.  Had subsequent serial CT imaging through September 2018.  Patient had a repeat CT scan of the chest in July 2021.  This revealed stable lobulated irregular biapical masses with no progression of disease and stable hilar lymph nodes.  Due to ongoing back pain patient had a repeat chest x-ray at primary care which led to the referral today.  Also in 2022 in the emergency department patient had a repeat CT scan of the chest was the time she was diagnosed with COVID-19 at extensive bilateral groundglass opacities within the lung.  These series of images have been reviewed today.  The area of scarring within the apices had been the same this entire time.  Patient is a longtime smoker she quit several years ago however smoked at least a half a pack a day for 40+ years.  Also lives with her ex-husband who was a heavy smoker.  OV 01/01/2022: Patient had recent  CT scan of the chest and here for follow-up after that.See to chest completed on 12/26/2021 revealed significant enlargement of a right apical pleural-based mass now 5.7 cm with associated multiple parenchymal and subpleural nodules concerning for metastatic disease.  Additionally there is direct involvement of the chest wall from the right apical mass with bony destruction of the right fourth rib  OV 04/07/2022: Here today for follow-up after radiation treatments.  She did really well with these with radiation oncology.  She is no longer having any pain in the right fourth rib lesion area.  She is barely taking any tramadol once or twice a week.  She had outpatient palliative care come and visit with her.  Overall from respiratory standpoint she is able to complete all of her activities of daily living.  Wants to continue to be able to do this as long as she possibly can.  Agree with the ongoing outpatient palliative care.  OV 12/19/2022: Here today for follow-up.  She has been getting radiation treatments to the bone and scapula to help with pain control.  She has been following with palliative care at the cancer center.  They are helping manage her chronic pain related to underlying malignancy.  She is hypoxemic with exertion and does need oxygen supplementation.  Her O2 sats are 90 on room air.  So we will walk her today to get her orders for O2 supplies she would like a portable oxygen tank.  We spent some  time today in the office talking about next steps.  At some point time I did explain that she would need transition to home hospice care.  There is still something white resistant to that.  This was most of our conversation today about ongoing management and goals of care as an outpatient with an advanced disease that is not curable.     Past Medical History:  Diagnosis Date   COPD (chronic obstructive pulmonary disease) (HCC)    slight    Emphysema lung (HCC)    GERD (gastroesophageal reflux  disease)    High cholesterol    Hypertension    patient states b/p up and down not on meds   Kidney stones    SVT (supraventricular tachycardia)      Family History  Problem Relation Age of Onset   Diabetes Mother      Past Surgical History:  Procedure Laterality Date   ABDOMINAL HYSTERECTOMY     CATARACT EXTRACTION     HERNIA REPAIR     INCISIONAL HERNIA REPAIR N/A 07/18/2014   Procedure: REPAIR OF INACERATED INCISIONAL HERNIA WITH MESH;  Surgeon: Armandina Gemma, MD;  Location: [facility];  Service: General;  Laterality: N/A;   INSERTION OF MESH N/A 07/18/2014   Procedure: INSERTION OF MESH;  Surgeon: Armandina Gemma, MD;  Location: [facility];  Service: General;  Laterality: N/A;   KNEE SURGERY     VIDEO BRONCHOSCOPY Bilateral 11/05/2015   Procedure: VIDEO BRONCHOSCOPY WITHOUT FLUORO;  Surgeon: Javier Glazier, MD;  Location: [facility];  Service: Cardiopulmonary;  Laterality: Bilateral;    Social History   Socioeconomic History   Marital status: Widowed    Spouse name: Not on file   Number of children: Not on file   Years of education: Not on file   Highest education level: Not on file  Occupational History   Not on file  Tobacco Use   Smoking status: Former    Packs/day: 0.50    Years: 60.00    Total pack years: 30.00    Types: Cigarettes    Quit date: 10/27/2006    Years since quitting: 16.1   Smokeless tobacco: Never  Vaping Use   Vaping Use: Never used  Substance and Sexual Activity   Alcohol use: No    Alcohol/week: 0.0 standard drinks of alcohol   Drug use: No   Sexual activity: Not on file  Other Topics Concern   Not on file  Social History Narrative   Not on file   Social Determinants of Health   Financial Resource Strain: Not on file  Food Insecurity: Not on file  Transportation Needs: Not on file  Physical Activity: Not on file  Stress: Not on file  Social Connections: Not on file  Intimate Partner Violence: Not on file     Allergies  Allergen  Reactions   Nabumetone Itching and Swelling     Outpatient Medications Prior to Visit  Medication Sig Dispense Refill   Diclofenac Sodium 3 % GEL Apply 1 application. topically 2 (two) times daily.     furosemide (LASIX) 20 MG tablet 1 tab(s)     gabapentin (NEURONTIN) 300 MG capsule Take 300 mg by mouth daily as needed (pain).     mirtazapine (REMERON) 15 MG tablet Take 30 mg by mouth at bedtime.     nitroGLYCERIN (NITROSTAT) 0.3 MG SL tablet 1 tab(s)     omeprazole (PRILOSEC) 20 MG capsule Take 20 mg by mouth daily.  oxyCODONE (OXYCONTIN) 15 mg 12 hr tablet Take 1 tablet (15 mg total) by mouth every 12 (twelve) hours as needed (as needed for severe pain). 60 tablet 0   oxyCODONE-acetaminophen (PERCOCET) 10-325 MG tablet Take 1 tablet by mouth every 6 (six) hours as needed for pain. 60 tablet 0   metoprolol succinate (TOPROL-XL) 25 MG 24 hr tablet Take 0.5 tablets (12.5 mg total) by mouth daily. 45 tablet 0   No facility-administered medications prior to visit.    Review of Systems  Constitutional:  Positive for malaise/fatigue and weight loss. Negative for chills and fever.  HENT:  Negative for hearing loss, sore throat and tinnitus.   Eyes:  Negative for blurred vision and double vision.  Respiratory:  Negative for cough, hemoptysis, sputum production, shortness of breath, wheezing and stridor.   Cardiovascular:  Negative for chest pain, palpitations, orthopnea, leg swelling and PND.  Gastrointestinal:  Negative for abdominal pain, constipation, diarrhea, heartburn, nausea and vomiting.  Genitourinary:  Negative for dysuria, hematuria and urgency.  Musculoskeletal:  Positive for back pain and myalgias. Negative for joint pain.  Skin:  Negative for itching and rash.  Neurological:  Negative for dizziness, tingling, weakness and headaches.  Endo/Heme/Allergies:  Negative for environmental allergies. Does not bruise/bleed easily.  Psychiatric/Behavioral:  Negative for depression.  The patient is not nervous/anxious and does not have insomnia.   All other systems reviewed and are negative.    Objective:  Physical Exam Vitals reviewed.  Constitutional:      General: She is not in acute distress.    Appearance: She is well-developed.     Comments: Thin, weight loss  HENT:     Head: Normocephalic and atraumatic.  Eyes:     General: No scleral icterus.    Conjunctiva/sclera: Conjunctivae normal.     Pupils: Pupils are equal, round, and reactive to light.  Neck:     Vascular: No JVD.     Trachea: No tracheal deviation.  Cardiovascular:     Rate and Rhythm: Normal rate and regular rhythm.     Heart sounds: Normal heart sounds. No murmur heard. Pulmonary:     Effort: Pulmonary effort is normal. No tachypnea, accessory muscle usage or respiratory distress.     Breath sounds: No stridor. No wheezing, rhonchi or rales.     Comments: Diminished breath sounds but laterally Abdominal:     General: There is no distension.     Palpations: Abdomen is soft.     Tenderness: There is no abdominal tenderness.  Musculoskeletal:        General: No tenderness.     Cervical back: Neck supple.  Lymphadenopathy:     Cervical: No cervical adenopathy.  Skin:    General: Skin is warm and dry.     Capillary Refill: Capillary refill takes less than 2 seconds.     Findings: No rash.  Neurological:     Mental Status: She is alert and oriented to person, place, and time.  Psychiatric:        Behavior: Behavior normal.      Vitals:   12/19/22 1602  BP: 100/70  Pulse: 95  SpO2: 90%  Weight: 141 lb 3.2 oz (64 kg)  Height: '5\' 4"'$  (1.626 m)   90% on RA BMI Readings from Last 3 Encounters:  12/19/22 24.24 kg/m  11/19/22 25.54 kg/m  08/22/22 28.58 kg/m   Wt Readings from Last 3 Encounters:  12/19/22 141 lb 3.2 oz (64 kg)  11/19/22 148 lb 12.8  oz (67.5 kg)  08/22/22 166 lb 8 oz (75.5 kg)     CBC    Component Value Date/Time   WBC 11.9 (H) 12/04/2020 0537   RBC  5.51 (H) 12/04/2020 0537   HGB 15.7 (H) 12/04/2020 0537   HCT 49.0 (H) 12/04/2020 0537   PLT 319 12/04/2020 0537   MCV 88.9 12/04/2020 0537   MCH 28.5 12/04/2020 0537   MCHC 32.0 12/04/2020 0537   RDW 12.7 12/04/2020 0537   LYMPHSABS 0.8 11/28/2020 0434   MONOABS 0.4 11/28/2020 0434   EOSABS 0.0 11/28/2020 0434   BASOSABS 0.0 11/28/2020 0434    Chest Imaging: Several CT imaging from 2017 through 2022 reviewed today in the office: Has areas of scarring and apical lobulated masses within both lungs. The patient's images have been independently reviewed by me.   12/26/2021 CT chest: Progressive enlargement of the right apical lesion now 5.7 cm in largest cross-section with associated metastatic disease to the right chest and multiple pulmonary nodules.  This is concerning for an advanced age bronchogenic carcinoma. The patient's images have been independently reviewed by me.     Pulmonary Functions Testing Results:    Latest Ref Rng & Units 12/21/2015    9:32 AM  PFT Results  FVC-Pre L 2.31   FVC-Predicted Pre % 91   FVC-Post L 2.34   FVC-Predicted Post % 92   Pre FEV1/FVC % % 77   Post FEV1/FCV % % 79   FEV1-Pre L 1.78   FEV1-Predicted Pre % 94   FEV1-Post L 1.86   TLC L 10.62   TLC % Predicted % 204   RV % Predicted % 329     FeNO:   Pathology:   Echocardiogram:   Heart Catheterization:     Assessment & Plan:     ICD-10-CM   1. Hospice care patient  Z51.5     2. Malignant neoplasm of lung, unspecified laterality, unspecified part of lung ([place])  C34.90     3. Multiple nodules of lung  R91.8     4. Former cigarette smoker  Z87.891       Discussion:  This is a 89 year old female, multiple pulmonary nodules, large apical mass.  She has disease that is eroding into the posterior aspect of the right ribs.  This is caused pain in the past and she has tolerated radiation treatments for palliative pain control.  Plan: Appreciate palliative care input. We spent  a significant out of time today in the office discussing next steps. I do think that she needs outpatient transition to hospice care. She is continuing to use as needed pain medications for control of symptoms. Family is discussing at this point whether or not consideration to taking her back to [place] to be with family towards the end of life. I told them that I thought that they should make this decision shortly if that was the desire of the patient.    Current Outpatient Medications:    Diclofenac Sodium 3 % GEL, Apply 1 application. topically 2 (two) times daily., Disp: , Rfl:    furosemide (LASIX) 20 MG tablet, 1 tab(s), Disp: , Rfl:    gabapentin (NEURONTIN) 300 MG capsule, Take 300 mg by mouth daily as needed (pain)., Disp: , Rfl:    mirtazapine (REMERON) 15 MG tablet, Take 30 mg by mouth at bedtime., Disp: , Rfl:    nitroGLYCERIN (NITROSTAT) 0.3 MG SL tablet, 1 tab(s), Disp: , Rfl:    omeprazole (PRILOSEC) 20 MG  capsule, Take 20 mg by mouth daily., Disp: , Rfl:    oxyCODONE (OXYCONTIN) 15 mg 12 hr tablet, Take 1 tablet (15 mg total) by mouth every 12 (twelve) hours as needed (as needed for severe pain)., Disp: 60 tablet, Rfl: 0   oxyCODONE-acetaminophen (PERCOCET) 10-325 MG tablet, Take 1 tablet by mouth every 6 (six) hours as needed for pain., Disp: 60 tablet, Rfl: 0   metoprolol succinate (TOPROL-XL) 25 MG 24 hr tablet, Take 0.5 tablets (12.5 mg total) by mouth daily., Disp: 45 tablet, Rfl: 0   Garner Nash, DO [facility] 12/19/2022 4:38 PM

## 2022-12-19 NOTE — Progress Notes (Signed)
  Interdisciplinary Goals of Care Family Meeting   Date carried out: 12/19/2022  Location of the meeting: Bedside  Member's involved: Physician, granddaughter at bedside  [place] or acting medical decision maker: Patient and granddaughter  Discussion: We discussed goals of care for [employer] .  Discussed her multiple medical comorbidities.  She has a incurable disease with advanced stage lung cancer.  Code status: Full code    Disposition: Home   Time spent for the meeting: [place], DO  12/19/2022, 4:40 PM

## 2022-12-19 NOTE — Patient Instructions (Signed)
Thank you for visiting Dr. Valeta Harms at [facility]. Today we recommend the following:  Walk for oxygen needs.   Return if symptoms worsen or fail to improve.    Please do your part to reduce the spread of COVID-19.

## 2022-12-25 ENCOUNTER — Telehealth: Payer: Self-pay | Admitting: Pulmonary Disease

## 2022-12-25 DIAGNOSIS — Z515 Encounter for palliative care: Secondary | ICD-10-CM

## 2022-12-26 NOTE — Telephone Encounter (Signed)
New order placed. Nothing further needed. 

## 2022-12-31 ENCOUNTER — Telehealth: Payer: Self-pay | Admitting: Pulmonary Disease

## 2023-01-05 ENCOUNTER — Other Ambulatory Visit: Payer: Self-pay | Admitting: Nurse Practitioner

## 2023-01-05 ENCOUNTER — Other Ambulatory Visit (HOSPITAL_COMMUNITY): Payer: Self-pay

## 2023-01-05 DIAGNOSIS — G893 Neoplasm related pain (acute) (chronic): Secondary | ICD-10-CM

## 2023-01-05 DIAGNOSIS — C3411 Malignant neoplasm of upper lobe, right bronchus or lung: Secondary | ICD-10-CM

## 2023-01-05 DIAGNOSIS — Z515 Encounter for palliative care: Secondary | ICD-10-CM

## 2023-01-06 ENCOUNTER — Other Ambulatory Visit (HOSPITAL_COMMUNITY): Payer: Self-pay

## 2023-01-06 MED ORDER — OXYCODONE HCL ER 15 MG PO T12A
15.0000 mg | EXTENDED_RELEASE_TABLET | Freq: Two times a day (BID) | ORAL | 0 refills | Status: DC | PRN
  Filled 2023-01-13 – 2023-03-13 (×3): qty 60, 30d supply, fill #0

## 2023-01-07 ENCOUNTER — Other Ambulatory Visit (HOSPITAL_COMMUNITY): Payer: Self-pay

## 2023-01-07 ENCOUNTER — Other Ambulatory Visit: Payer: Self-pay | Admitting: Nurse Practitioner

## 2023-01-07 DIAGNOSIS — G893 Neoplasm related pain (acute) (chronic): Secondary | ICD-10-CM

## 2023-01-07 DIAGNOSIS — C3411 Malignant neoplasm of upper lobe, right bronchus or lung: Secondary | ICD-10-CM

## 2023-01-07 DIAGNOSIS — Z515 Encounter for palliative care: Secondary | ICD-10-CM

## 2023-01-07 MED ORDER — OXYCODONE-ACETAMINOPHEN 10-325 MG PO TABS
1.0000 | ORAL_TABLET | Freq: Four times a day (QID) | ORAL | 0 refills | Status: DC | PRN
  Filled 2023-01-07: qty 60, 15d supply, fill #0

## 2023-01-13 ENCOUNTER — Other Ambulatory Visit: Payer: Self-pay

## 2023-01-13 ENCOUNTER — Other Ambulatory Visit (HOSPITAL_COMMUNITY): Payer: Self-pay

## 2023-01-20 ENCOUNTER — Other Ambulatory Visit: Payer: Medicaid Other

## 2023-01-28 ENCOUNTER — Encounter: Payer: Self-pay | Admitting: Nurse Practitioner

## 2023-01-28 ENCOUNTER — Inpatient Hospital Stay: Payer: Medicaid Other | Attending: Nurse Practitioner | Admitting: Nurse Practitioner

## 2023-01-28 DIAGNOSIS — Z515 Encounter for palliative care: Secondary | ICD-10-CM

## 2023-01-28 DIAGNOSIS — C3411 Malignant neoplasm of upper lobe, right bronchus or lung: Secondary | ICD-10-CM | POA: Diagnosis not present

## 2023-01-28 DIAGNOSIS — G893 Neoplasm related pain (acute) (chronic): Secondary | ICD-10-CM | POA: Diagnosis not present

## 2023-01-28 DIAGNOSIS — Z7189 Other specified counseling: Secondary | ICD-10-CM

## 2023-01-28 DIAGNOSIS — R530 Neoplastic (malignant) related fatigue: Secondary | ICD-10-CM

## 2023-01-28 NOTE — Progress Notes (Signed)
[facility]  [phone] [phone]) [phone]   Name: Brooke Mora Date: 01/28/2023 MRN: [id number]  DOB: 04-15-1929  Patient Care Team: Benito Mccreedy, MD as PCP - General (Internal Medicine) Burnell Blanks, MD as PCP - Cardiology (Cardiology)   I connected with Brooke Mora on 01/28/23 at 12:00 PM EDT by Phone and verified that I am speaking with the correct person using two identifiers.   I discussed the limitations, risks, security and privacy concerns of performing an evaluation and management service by telemedicine and the availability of in-person appointments. I also discussed with the patient that there may be a patient responsible charge related to this service. The patient expressed understanding and agreed to proceed.   Other persons participating in the visit and their role in the encounter: Gideon, Granddaughter   Patient's location: Home   Provider's location: [place]    INTERVAL HISTORY: Brooke Mora is a 89 y.o. 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  Palliative ask to see for symptom and pain management and goals of care. She is actively being followed by [facility]'s palliative team in the home.    SOCIAL HISTORY:     reports that she quit smoking about 16 years ago. Her smoking use included cigarettes. She has a 30.00 pack-year smoking history. She has never used smokeless tobacco. She reports that she does not drink alcohol and does not use drugs.  ADVANCE DIRECTIVES:  DNR on file  CODE STATUS: DNR  PAST MEDICAL HISTORY: Past Medical History:  Diagnosis Date   COPD (chronic obstructive pulmonary disease) ([place])    slight    Emphysema lung (HCC)    GERD (gastroesophageal reflux disease)    High cholesterol    Hypertension    patient states b/p up and down not on meds   Kidney  stones    SVT (supraventricular tachycardia)     ALLERGIES:  is allergic to nabumetone.  MEDICATIONS:  Current Outpatient Medications  Medication Sig Dispense Refill   Diclofenac Sodium 3 % GEL Apply 1 application. topically 2 (two) times daily.     furosemide (LASIX) 20 MG tablet 1 tab(s)     gabapentin (NEURONTIN) 300 MG capsule Take 300 mg by mouth daily as needed (pain).     metoprolol succinate (TOPROL-XL) 25 MG 24 hr tablet Take 0.5 tablets (12.5 mg total) by mouth daily. 45 tablet 0   mirtazapine (REMERON) 15 MG tablet Take 30 mg by mouth at bedtime.     nitroGLYCERIN (NITROSTAT) 0.3 MG SL tablet 1 tab(s)     omeprazole (PRILOSEC) 20 MG capsule Take 20 mg by mouth daily.     oxyCODONE (OXYCONTIN) 15 mg 12 hr tablet Take 1 tablet (15 mg total) by mouth every 12 (twelve) hours as needed (as needed for severe pain). 60 tablet 0   oxyCODONE-acetaminophen (PERCOCET) 10-325 MG tablet Take 1 tablet by mouth every 6 (six) hours as needed for pain. 60 tablet 0   No current facility-administered medications for this visit.    VITAL SIGNS: There were no vitals taken for this visit. There were no vitals filed for this visit.  Estimated body mass index is 24.24 kg/m as calculated from the following:   Height as of 12/19/22: 5\' 4"  ([id number] m).   Weight as of 12/19/22: 141 lb 3.2 oz (64 kg).   PERFORMANCE STATUS (ECOG) :  1 - Symptomatic but completely ambulatory   IMPRESSION: I connected by phone with Brooke Mora, patient's granddaughter and Brooke Mora. No acute distress. Patient continues to remain as active as possible. Denies nausea, vomiting, constipation, or diarrhea. Is also followed by outpatient Palliative with [facility] for support.   Neoplasm/Chronic Pain  Brooke Mora's pain is well controlled on current regimen. Tolerating without difficulty.    We discussed pain regimen. She is taking Oxycodone ER 15 mg every 12 hours and oxycodone/acetaminophen 10/325 mg every 6 hours as  needed for breakthrough pain. She does not require breakthrough medication around the clock. Some days are better than others.   Given pain is well controlled on current regimen. No changes. Refills will be sent to pharmacy.    Decreased appetite Appetite is ok. Some days are better than others. Family is focusing on feeding her foods that she enjoys with occasional snacking.   Goals of Care  Patient and family continues to request full code/full scope care. Taking life one day at a time with goal of making complex decisions as needed.   1/24: We discussed Her current illness and what it means in the larger context of Her on-going co-morbidities. Natural disease trajectory and expectations were discussed. Brooke Mora quality of life is good. Her family shares appreciation of how well she is doing given age and health concerns. They are clear in expressed wishes to continue taking life one day at a time, treat the treatable, manage her symptoms, allowing her every opportunity to continue thriving. They are not interested in hospice support at this time. She is actively followed in the home with [facility]'s palliative program.   I discussed the importance of continued conversation with family and their medical providers regarding overall plan of care and treatment options, ensuring decisions are within the context of the patients values and GOCs.  PLAN:  Oxycontin 15mg  every 12 hours Percocet 10/325 mg every 6 hours as needed for breakthrough pain. Does not take around the clock.  Goals of care discussions. Patient is actively followed by [facility]'s home palliative program.  I will plan to see patient back in 6-8 weeks. Family knows to call sooner if needed.   Patient expressed understanding and was in agreement with this plan. She also understands that She can call the clinic at any time with any questions, concerns, or complaints.   Any controlled substances utilized were prescribed  in the context of palliative care. PDMP has been reviewed.    Visit consisted of counseling and education dealing with the complex and emotionally intense issues of symptom management and palliative care in the setting of serious and potentially life-threatening illness.Greater than 50%  of this time was spent counseling and coordinating care related to the above assessment and plan.  Alda Lea, AGPCNP-BC  Palliative Medicine Team/[place]  *Please note that this is a verbal dictation therefore any spelling or grammatical errors are due to the "[place] [employer]" system interpretation.

## 2023-02-09 ENCOUNTER — Encounter: Payer: Self-pay | Admitting: Pulmonary Disease

## 2023-02-13 ENCOUNTER — Other Ambulatory Visit: Payer: Medicaid Other

## 2023-02-13 DIAGNOSIS — Z515 Encounter for palliative care: Secondary | ICD-10-CM

## 2023-02-17 NOTE — Progress Notes (Signed)
PATIENT NAME: Brooke Mora DOB: 1928-11-02 MRN: [id number]  PRIMARY CARE PROVIDER: Jackie Plum, MD  RESPONSIBLE PARTY:  Acct ID - Guarantor Home Phone Work Phone Relationship Acct Type  [id number] [name]* [phone]  Self P/F     [address] [name], [place] [address]      Made call to Granddaughter who, was unable to reschedule appt at this time. Will follow up for rescheduled appt in the home.            Chermaine Schnyder Clementeen Graham, LPN

## 2023-03-11 ENCOUNTER — Telehealth: Payer: Self-pay

## 2023-03-11 ENCOUNTER — Inpatient Hospital Stay: Payer: Medicaid Other | Attending: Nurse Practitioner | Admitting: Nurse Practitioner

## 2023-03-11 ENCOUNTER — Encounter: Payer: Self-pay | Admitting: Nurse Practitioner

## 2023-03-11 ENCOUNTER — Other Ambulatory Visit (HOSPITAL_COMMUNITY): Payer: Self-pay

## 2023-03-11 ENCOUNTER — Encounter: Payer: Self-pay | Admitting: Pulmonary Disease

## 2023-03-11 DIAGNOSIS — R0602 Shortness of breath: Secondary | ICD-10-CM | POA: Diagnosis not present

## 2023-03-11 DIAGNOSIS — G893 Neoplasm related pain (acute) (chronic): Secondary | ICD-10-CM

## 2023-03-11 DIAGNOSIS — Z515 Encounter for palliative care: Secondary | ICD-10-CM | POA: Diagnosis not present

## 2023-03-11 DIAGNOSIS — C3411 Malignant neoplasm of upper lobe, right bronchus or lung: Secondary | ICD-10-CM | POA: Diagnosis not present

## 2023-03-11 DIAGNOSIS — R530 Neoplastic (malignant) related fatigue: Secondary | ICD-10-CM

## 2023-03-11 DIAGNOSIS — R531 Weakness: Secondary | ICD-10-CM

## 2023-03-11 DIAGNOSIS — R630 Anorexia: Secondary | ICD-10-CM | POA: Diagnosis not present

## 2023-03-11 MED ORDER — METHYLPREDNISOLONE 4 MG PO TBPK
ORAL_TABLET | ORAL | 0 refills | Status: DC
  Filled 2023-03-11: qty 21, 6d supply, fill #0

## 2023-03-11 NOTE — Telephone Encounter (Signed)
Made phone call to Granddaughter [place] to get an update from pt's visit with Oncology and schedule an appointment for a Palliative Care [facility]) Home visit. [place] reports pt is still not receptive to hospice. PC nurse will visit pt this Friday 03/13/23 @ 12pm.   Abran Cantor, LPN [facility] Palliative Care Nurse [phone]

## 2023-03-11 NOTE — Progress Notes (Signed)
Palliative Medicine [facility]  [phone]) [phone] [phone]   Name: Brooke Mora Date: 03/11/2023 MRN: [id number]  DOB: 04-Oct-1929  Patient Care Team: Jackie Plum, MD as PCP - General (Internal Medicine) Kathleene Hazel, MD as PCP - Cardiology (Cardiology)   I connected with Brooke Mora on 03/11/23 at  1:00 PM EDT by Phone and verified that I am speaking with the correct person using two identifiers.   I discussed the limitations, risks, security and privacy concerns of performing an evaluation and management service by telemedicine and the availability of in-person appointments. I also discussed with the patient that there may be a patient responsible charge related to this service. The patient expressed understanding and agreed to proceed.   Other persons participating in the visit and their role in the encounter: Brooke Mora, Brooke Mora   Patient's location: Home   Provider's location: [facility]    INTERVAL HISTORY: Brooke Mora is a 89 y.o. 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  Palliative ask to see for symptom and pain management and goals of care. She is actively being followed by [facility]'s palliative team in the home.    SOCIAL HISTORY:     reports that she quit smoking about 16 years ago. Her smoking use included cigarettes. She has a 30.00 pack-year smoking history. She has never used smokeless tobacco. She reports that she does not drink alcohol and does not use drugs.  ADVANCE DIRECTIVES:  DNR on file  CODE STATUS: DNR  PAST MEDICAL HISTORY: Past Medical History:  Diagnosis Date   COPD (chronic obstructive pulmonary disease) (HCC)    slight    Emphysema lung (HCC)    GERD (gastroesophageal reflux disease)    High cholesterol    Hypertension    patient states b/p up and down not on meds   Kidney  stones    SVT (supraventricular tachycardia)     ALLERGIES:  is allergic to nabumetone.  MEDICATIONS:  Current Outpatient Medications  Medication Sig Dispense Refill   Diclofenac Sodium 3 % GEL Apply 1 application. topically 2 (two) times daily.     furosemide (LASIX) 20 MG tablet 1 tab(s)     gabapentin (NEURONTIN) 300 MG capsule Take 300 mg by mouth daily as needed (pain).     metoprolol succinate (TOPROL-XL) 25 MG 24 hr tablet Take 0.5 tablets (12.5 mg total) by mouth daily. 45 tablet 0   mirtazapine (REMERON) 15 MG tablet Take 30 mg by mouth at bedtime.     nitroGLYCERIN (NITROSTAT) 0.3 MG SL tablet 1 tab(s)     omeprazole (PRILOSEC) 20 MG capsule Take 20 mg by mouth daily.     oxyCODONE (OXYCONTIN) 15 mg 12 hr tablet Take 1 tablet (15 mg total) by mouth every 12 (twelve) hours as needed (as needed for severe pain). 60 tablet 0   oxyCODONE-acetaminophen (PERCOCET) 10-325 MG tablet Take 1 tablet by mouth every 6 (six) hours as needed for pain. 60 tablet 0   No current facility-administered medications for this visit.    VITAL SIGNS: There were no vitals taken for this visit. There were no vitals filed for this visit.  Estimated body mass index is 24.24 kg/m as calculated from the following:   Height as of 12/19/22: 5\' 4"  (1.626 m).   Weight as of 12/19/22: 141 lb 3.2 oz (64 kg).   PERFORMANCE STATUS (ECOG) :  1 - Symptomatic but completely ambulatory   IMPRESSION:  I connected with patient's Brooke Mora, Brooke Mora by phone. She shares Brooke Mora is sleeping more and appetite is minimal despite efforts by family. Patient is also now ambulating with a walker due to recent fall. Denies any injuries. Some dyspnea with exertion. Hefel reports oxygen saturations when monitored at home range from 88-91%  Neoplasm/Chronic Pain  Brooke Mora's pain is well controlled on current regimen. Tolerating without difficulty.    We discussed pain regimen. She is taking Oxycodone ER 15 mg  every 12 hours and oxycodone/acetaminophen 10/325 mg every 6 hours as needed for breakthrough pain. She does not require breakthrough medication around the clock. Some days are better than others.   Given pain is well controlled on current regimen. No changes. Although refills have not been sent since March 2024 family states she is taking medications daily at reduced dose. They know to contact office for refills.    Decreased appetite Appetite is minimal. Family is trying to prepare foods patient enjoys however she continues to not eat much. Family is interested in low dose steroid with hopes of "kick starting" her desire to eat again and assist with her dyspnea.   Goals of Care  Patient and family continues to request full code/full scope care. Taking life one day at a time with goal of making complex decisions as needed. Although patient is appropriate for hospice services given co-morbidities, failure to thrive, and prognosis she and family are not at the point they are ready to consider hospice despite discussions. She is actively followed by [facility]'s palliative. Will reach out for close follow-up and support. Patient's symptoms and decline is most likely related to her overall decline and health.   1/24: We discussed Her current illness and what it means in the larger context of Her on-going co-morbidities. Natural disease trajectory and expectations were discussed. Brooke Mora quality of life is good. Her family shares appreciation of how well she is doing given age and health concerns. They are clear in expressed wishes to continue taking life one day at a time, treat the treatable, manage her symptoms, allowing her every opportunity to continue thriving. They are not interested in hospice support at this time. She is actively followed in the home with [facility]'s palliative program.   I discussed the importance of continued conversation with family and their medical providers regarding  overall plan of care and treatment options, ensuring decisions are within the context of the patients values and GOCs.  PLAN:  Oxycontin 15mg  every 12 hours Percocet 10/325 mg every 6 hours as needed for breakthrough pain. Does not take around the clock.  Medrol dose pack.  Extensive Goals of care discussions. Patient and family are not ready to accept hospice despite discussions. Patient is actively followed by [facility]'s home palliative program.  I will plan to see patient back in 6-8 weeks. Family knows to call sooner if needed.   Patient expressed understanding and was in agreement with this plan. She also understands that She can call the clinic at any time with any questions, concerns, or complaints.   Any controlled substances utilized were prescribed in the context of palliative care. PDMP has been reviewed.    Visit consisted of counseling and education dealing with the complex and emotionally intense issues of symptom management and palliative care in the setting of serious and potentially life-threatening illness.Greater than 50%  of this time was spent counseling and coordinating care related to the above  assessment and plan.  Willette Alma, AGPCNP-BC  Palliative Medicine Team/[facility]  *Please note that this is a verbal dictation therefore any spelling or grammatical errors are due to the "Dragon [employer] One" system interpretation.

## 2023-03-12 ENCOUNTER — Other Ambulatory Visit (HOSPITAL_COMMUNITY): Payer: Self-pay

## 2023-03-13 ENCOUNTER — Other Ambulatory Visit (HOSPITAL_COMMUNITY): Payer: Self-pay

## 2023-03-13 ENCOUNTER — Other Ambulatory Visit: Payer: Medicaid Other

## 2023-03-13 VITALS — BP 138/82 | HR 88 | Temp 97.9°F

## 2023-03-13 DIAGNOSIS — Z515 Encounter for palliative care: Secondary | ICD-10-CM

## 2023-03-13 NOTE — Progress Notes (Signed)
PATIENT NAME: Brooke Mora DOB: 1929/08/16 MRN: [id number]  PRIMARY CARE PROVIDER: Jackie Plum, MD  RESPONSIBLE PARTY:  Acct ID - Guarantor Home Phone Work Phone Relationship Acct Type  [id number] [name]* [phone]  Self P/F     [address] [name], [place] [address]    Palliative Care Follow Up Encounter Note    Completed home visit. Daughter Brooke Mora also present and translates from [place] to [place]   Today's Visit: pt reports feeling cold and is wearing a zip up hoodie. She is sweating on her Left side but she is comfortable.   GI/GU: no urinary issues but applies a pad in her underwear for protection; occasional incontinence; has a BM daily; uses Miralax and only has occasional constipation   Cardiac: no BLE edema   Cognitive: alert   Appetite: snack sized meals daily plus protein shakes when she doesn't want to eat; drinks 3-8oz glasses of water per day    Mobility: walks with walker outside of her bedroom but not in her room   Respiratory: oxygen level was 84% on room air and did not increase after deep breaths; pt has O2 in the home and put on O2 @ 2L via nasal cannula and O2 went up to 94%;pt denies SOB during this visit   Sleeping Pattern: sleeps 14+hrs per night (goes to bed about 7am and wakes up at 9am)   Pain: no pain at this time; now takes Oxycontin and Oxycodone     Goals of Care: [name] will care for Ms. Flanery in the home "until she goes with Jesus".   CODE STATUS: DNR  ADVANCED DIRECTIVES: N MOST FORM: N PPS: 50%   PHYSICAL EXAM:   VITALS: Today's Vitals   03/13/23 1148  BP: 138/82  Pulse: 88  Temp: 97.9 F (36.6 C)  TempSrc: Temporal  SpO2: 94%  PainSc: 0-No pain    LUNGS: clear to auscultation  CARDIAC: Cor RRR EXTREMITIES: no edema SKIN: skin color slightly grey; skin on L arm moist and R arm dry  NEURO: negative except for weakness   Next scheduled appt: 03/24/23 @ 3:30pm    Made telephone call to  Granddaughter Blacketer to notify her that pt needs a refill of the Oxycodone/Acetaminophen because she uses it as her breakthrough pain med. Diffey voiced understanding and agreement.   Kiwan Gadsden Clementeen Graham, LPN

## 2023-03-14 ENCOUNTER — Other Ambulatory Visit (HOSPITAL_COMMUNITY): Payer: Self-pay

## 2023-03-19 ENCOUNTER — Other Ambulatory Visit (HOSPITAL_COMMUNITY): Payer: Self-pay

## 2023-03-19 ENCOUNTER — Other Ambulatory Visit: Payer: Self-pay | Admitting: Nurse Practitioner

## 2023-03-19 DIAGNOSIS — Z515 Encounter for palliative care: Secondary | ICD-10-CM

## 2023-03-19 DIAGNOSIS — C3411 Malignant neoplasm of upper lobe, right bronchus or lung: Secondary | ICD-10-CM

## 2023-03-19 DIAGNOSIS — G893 Neoplasm related pain (acute) (chronic): Secondary | ICD-10-CM

## 2023-03-19 MED ORDER — OXYCODONE-ACETAMINOPHEN 10-325 MG PO TABS
1.0000 | ORAL_TABLET | Freq: Four times a day (QID) | ORAL | 0 refills | Status: DC | PRN
  Filled 2023-03-19: qty 60, 15d supply, fill #0

## 2023-03-19 NOTE — Telephone Encounter (Signed)
Dr. Tonia Brooms please advise on the follow My Chart message:   [facility]ARRO[facility] (supporting Josephine Igo, DO)8 days ago    Good morning Dr. Tonia Brooms,    My grandmother isn't eating much, I know it's as expected but she's probably 120-125 pounds right now and is eating maybe once a day with only a few snacks here and there. I was wondering if a low dose prednisone would be recommended possibly to have her on to increase her appetite along with helping her breathing. He oxygen without the daily oxygen is 87-90 so I think maybe we could kill two birds with 1 stone if you could give me your thoughts.  Thank you

## 2023-03-24 ENCOUNTER — Other Ambulatory Visit: Payer: Medicaid Other

## 2023-03-24 DIAGNOSIS — Z515 Encounter for palliative care: Secondary | ICD-10-CM

## 2023-03-24 NOTE — Progress Notes (Unsigned)
PATIENT NAME: Brooke Mora DOB: [birth year] MRN: [id number]  PRIMARY CARE PROVIDER: Jackie Plum, MD  RESPONSIBLE PARTY:  Acct ID - Guarantor Home Phone Work Phone Relationship Acct Type  [id number] [name]* [phone]  Self P/F     [address] [name], [place] [address]   Palliative Care Follow Up Encounter Note    Completed home visit. Daughter Maretta Los and Granddaughter Solarz also present via phone   Today's Visit: pt is in bed during the entire  GI/GU: no urinary issues but applies a pad in her underwear for protection; occasional incontinence; has a BM daily; uses Miralax and only has occasional constipation   Cardiac: no BLE edema   Cognitive: pt asleep at this time  Appetite: pt is eating much less but daughter tells her she must eat; drinks 2-8oz glasses of water per day    Mobility: walks with walker but no as often because she is asleep most of the day   Respiratory: R-22; pt has on O2 @ 3L via nasal cannula;    Sleeping Pattern: sleeps 14+hrs   Pain: no signs of non verbal pain at this time     Goals of Care: [name] will care for Ms. Turnbull in the home "until she goes with Jesus".   CODE STATUS: DNR  ADVANCED DIRECTIVES: N MOST FORM: N PPS: 50%   PHYSICAL EXAM:   VITALS:There were no vitals filed for this visit.    No appt scheduled because this pt has been referred to hospice with family's consent   Itay Mella Clementeen Graham, LPN

## 2023-03-27 ENCOUNTER — Other Ambulatory Visit (HOSPITAL_COMMUNITY): Payer: Self-pay

## 2023-03-27 MED ORDER — PREDNISONE 10 MG PO TABS
10.0000 mg | ORAL_TABLET | Freq: Every day | ORAL | 2 refills | Status: DC
  Filled 2023-03-27: qty 30, 30d supply, fill #0

## 2023-04-03 ENCOUNTER — Other Ambulatory Visit (HOSPITAL_BASED_OUTPATIENT_CLINIC_OR_DEPARTMENT_OTHER): Payer: Self-pay

## 2023-04-03 ENCOUNTER — Other Ambulatory Visit (HOSPITAL_COMMUNITY): Payer: Self-pay

## 2023-04-03 MED ORDER — SENNOSIDES 8.6 MG PO TABS
1.0000 | ORAL_TABLET | Freq: Two times a day (BID) | ORAL | 3 refills | Status: DC | PRN
  Filled 2023-04-03: qty 100, 30d supply, fill #0
  Filled 2023-04-03 (×2): qty 30, 15d supply, fill #0

## 2023-04-03 MED ORDER — GUAIFENESIN 100 MG/5ML PO LIQD
200.0000 mg | ORAL | 2 refills | Status: DC | PRN
  Filled 2023-04-03: qty 354, 6d supply, fill #0
  Filled 2023-04-03: qty 473, 8d supply, fill #0

## 2023-04-09 ENCOUNTER — Other Ambulatory Visit (HOSPITAL_BASED_OUTPATIENT_CLINIC_OR_DEPARTMENT_OTHER): Payer: Self-pay

## 2023-04-09 MED ORDER — GABAPENTIN 300 MG PO CAPS
300.0000 mg | ORAL_CAPSULE | Freq: Three times a day (TID) | ORAL | 0 refills | Status: DC
  Filled 2023-04-09: qty 90, 30d supply, fill #0

## 2023-04-21 ENCOUNTER — Other Ambulatory Visit (HOSPITAL_BASED_OUTPATIENT_CLINIC_OR_DEPARTMENT_OTHER): Payer: Self-pay

## 2023-04-21 MED ORDER — GUAIFENESIN 100 MG/5ML PO LIQD
10.0000 mL | ORAL | 2 refills | Status: DC | PRN
  Filled 2023-04-21: qty 473, 8d supply, fill #0
  Filled 2023-05-08: qty 473, 8d supply, fill #1

## 2023-04-22 ENCOUNTER — Other Ambulatory Visit (HOSPITAL_BASED_OUTPATIENT_CLINIC_OR_DEPARTMENT_OTHER): Payer: Self-pay

## 2023-04-23 ENCOUNTER — Other Ambulatory Visit (HOSPITAL_BASED_OUTPATIENT_CLINIC_OR_DEPARTMENT_OTHER): Payer: Self-pay

## 2023-04-24 ENCOUNTER — Other Ambulatory Visit (HOSPITAL_BASED_OUTPATIENT_CLINIC_OR_DEPARTMENT_OTHER): Payer: Self-pay

## 2023-04-24 MED ORDER — TRAZODONE HCL 50 MG PO TABS
50.0000 mg | ORAL_TABLET | Freq: Every day | ORAL | 0 refills | Status: DC
  Filled 2023-04-24: qty 30, 30d supply, fill #0

## 2023-04-24 MED ORDER — PREDNISONE 10 MG PO TABS
10.0000 mg | ORAL_TABLET | Freq: Every day | ORAL | 2 refills | Status: DC
  Filled 2023-04-24: qty 30, 30d supply, fill #0

## 2023-04-29 ENCOUNTER — Other Ambulatory Visit (HOSPITAL_BASED_OUTPATIENT_CLINIC_OR_DEPARTMENT_OTHER): Payer: Self-pay

## 2023-04-29 MED ORDER — DOXYCYCLINE HYCLATE 100 MG PO TABS
100.0000 mg | ORAL_TABLET | Freq: Two times a day (BID) | ORAL | 0 refills | Status: DC
  Filled 2023-04-29: qty 14, 7d supply, fill #0

## 2023-05-04 ENCOUNTER — Other Ambulatory Visit (HOSPITAL_BASED_OUTPATIENT_CLINIC_OR_DEPARTMENT_OTHER): Payer: Self-pay

## 2023-05-04 MED ORDER — OXYCODONE-ACETAMINOPHEN 10-325 MG PO TABS
1.0000 | ORAL_TABLET | Freq: Four times a day (QID) | ORAL | 0 refills | Status: DC | PRN
  Filled 2023-05-04: qty 60, 15d supply, fill #0

## 2023-05-07 ENCOUNTER — Inpatient Hospital Stay: Payer: Medicaid Other | Attending: Nurse Practitioner | Admitting: Nurse Practitioner

## 2023-05-08 ENCOUNTER — Other Ambulatory Visit (HOSPITAL_BASED_OUTPATIENT_CLINIC_OR_DEPARTMENT_OTHER): Payer: Self-pay

## 2023-05-08 MED ORDER — MORPHINE SULFATE ER 15 MG PO TBCR
15.0000 mg | EXTENDED_RELEASE_TABLET | Freq: Two times a day (BID) | ORAL | 0 refills | Status: DC
  Filled 2023-05-08: qty 30, 15d supply, fill #0

## 2023-05-11 ENCOUNTER — Other Ambulatory Visit (HOSPITAL_BASED_OUTPATIENT_CLINIC_OR_DEPARTMENT_OTHER): Payer: Self-pay

## 2023-05-14 ENCOUNTER — Other Ambulatory Visit (HOSPITAL_BASED_OUTPATIENT_CLINIC_OR_DEPARTMENT_OTHER): Payer: Self-pay

## 2023-05-14 MED ORDER — TRIAMCINOLONE ACETONIDE 0.1 % EX CREA
TOPICAL_CREAM | CUTANEOUS | 1 refills | Status: DC
  Filled 2023-05-14: qty 15, 7d supply, fill #0

## 2023-05-16 ENCOUNTER — Other Ambulatory Visit (HOSPITAL_BASED_OUTPATIENT_CLINIC_OR_DEPARTMENT_OTHER): Payer: Self-pay

## 2023-05-18 ENCOUNTER — Other Ambulatory Visit (HOSPITAL_BASED_OUTPATIENT_CLINIC_OR_DEPARTMENT_OTHER): Payer: Self-pay

## 2023-05-18 MED ORDER — METRONIDAZOLE 500 MG PO TABS
ORAL_TABLET | ORAL | 2 refills | Status: DC
  Filled 2023-05-18: qty 30, 30d supply, fill #0

## 2023-05-21 ENCOUNTER — Other Ambulatory Visit (HOSPITAL_COMMUNITY): Payer: Self-pay

## 2023-05-28 DEATH — deceased

## 2023-07-29 ENCOUNTER — Other Ambulatory Visit (HOSPITAL_BASED_OUTPATIENT_CLINIC_OR_DEPARTMENT_OTHER): Payer: Self-pay

## 2024-02-04 IMAGING — CT CT CHEST SUPER D W/O CM
2 of 5 series · 14 of 36 positions shown, 17 images · non-contrast
Comparison: CT chest angiogram, 11/20/2020, CT chest, 04/27/2020

CLINICAL DATA: Lung nodules emphysema, former smoker

EXAM:
CT CHEST WITHOUT CONTRAST
TECHNIQUE: Multidetector CT imaging of the chest was performed using thin slice
collimation for electromagnetic bronchoscopy planning purposes,
without intravenous contrast.
RADIATION DOSE REDUCTION: This exam was performed according to the
departmental dose-optimization program which includes automated
exposure control, adjustment of the mA and/or kV according to
patient size and/or use of iterative reconstruction technique.

[Series 4: chest 2.00 br40 s3 · coronal · 0.58mm/px · 3 of 189 slices shown]
[im 38/189  lung]
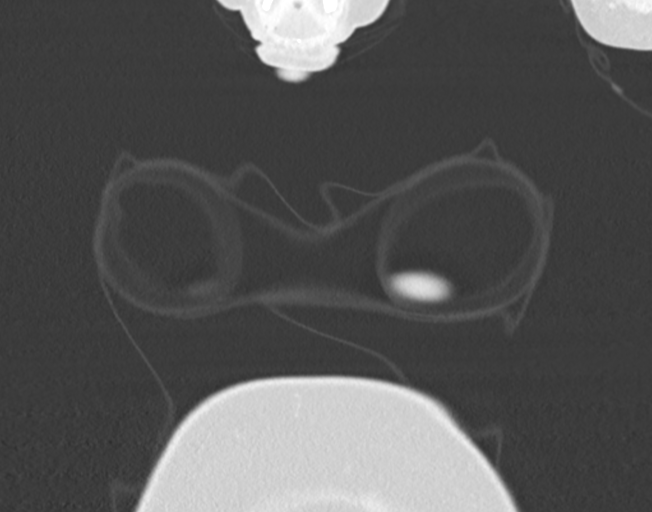
[im 76/189  lung]
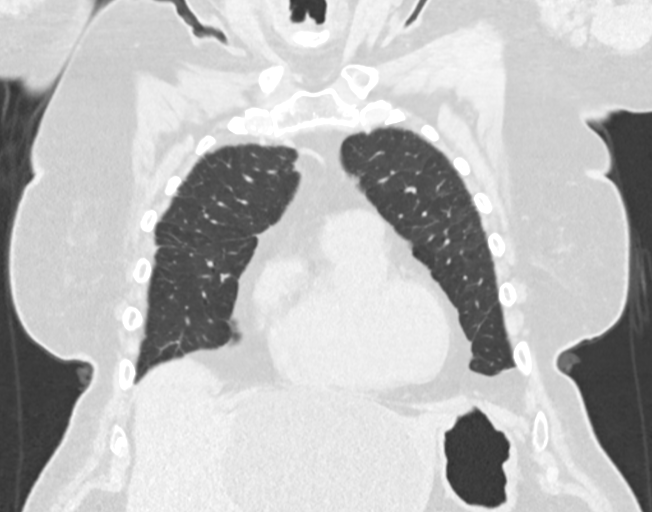
[im 113/189  lung]
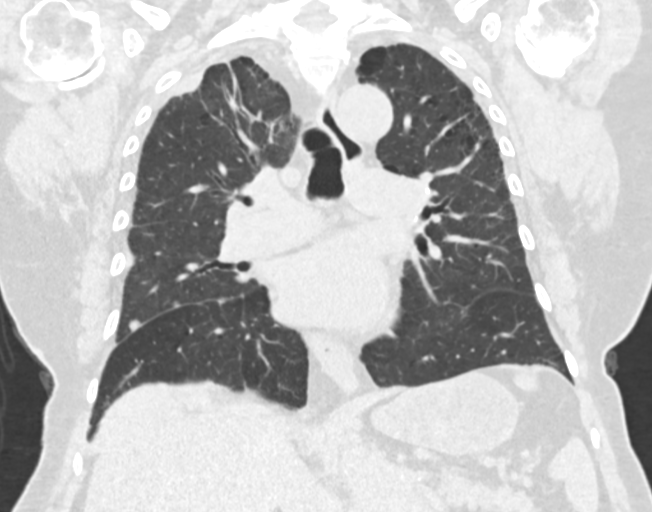

[Series 10: chest 1.00 br40 s3 super d · axial · 0.74mm/px · z∈[+1409,+1666]mm · 11 of 371 slices shown, 14 images]
[im 25/371  mediastinal]
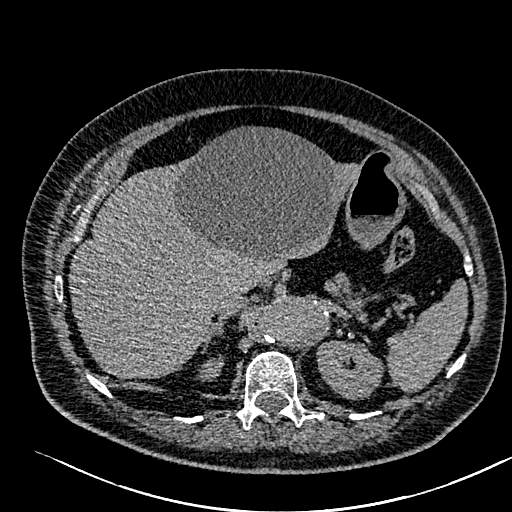
[im 25/371  lung]
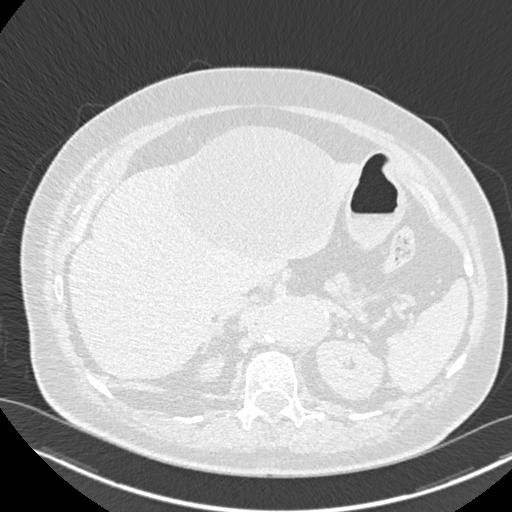
[im 50/371  lung]
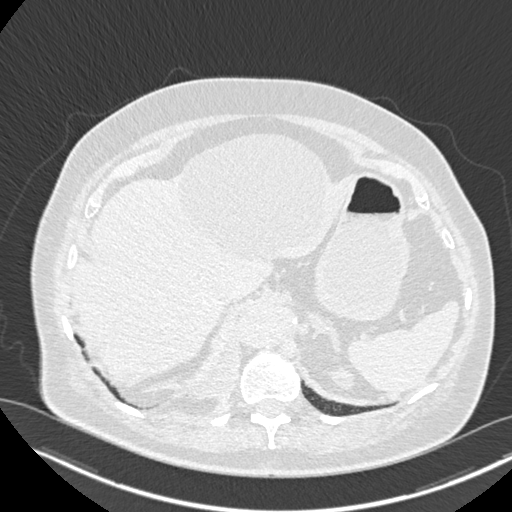
[im 99/371  lung]
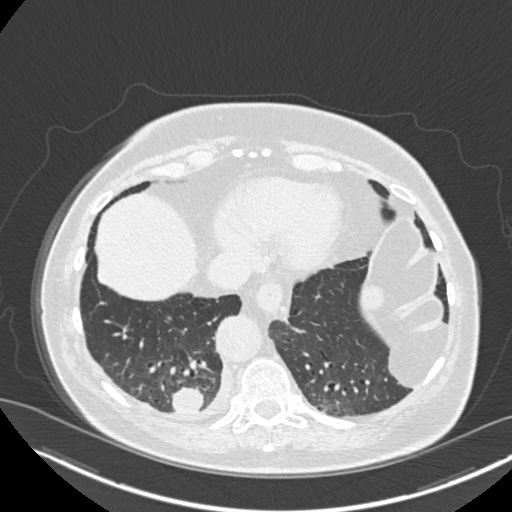
[im 124/371  lung]
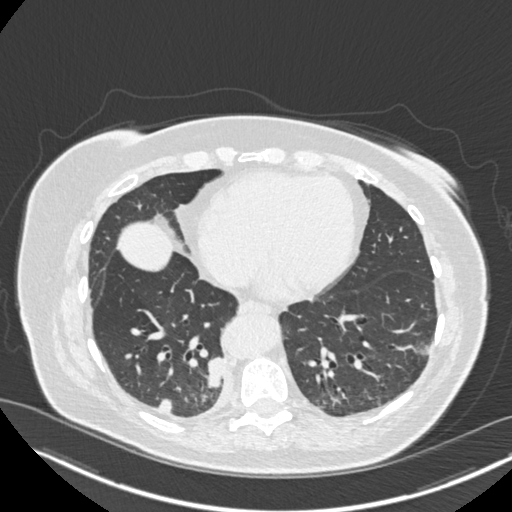
[im 149/371  mediastinal]
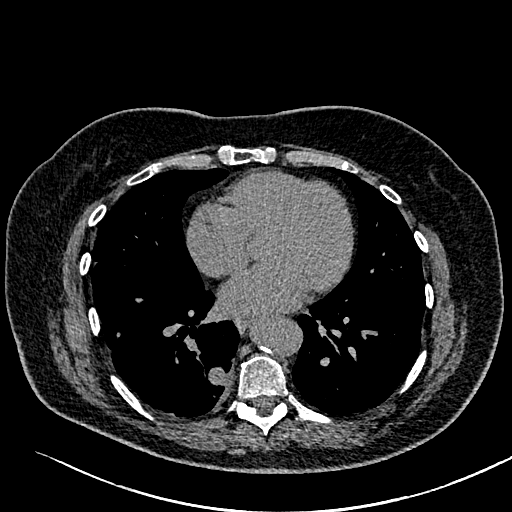
[im 149/371  lung]
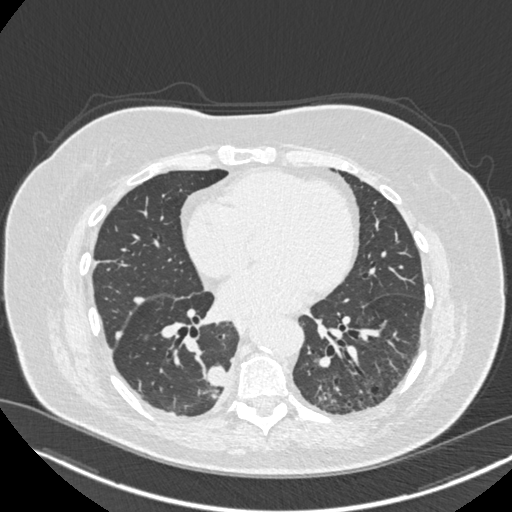
[im 198/371  lung]
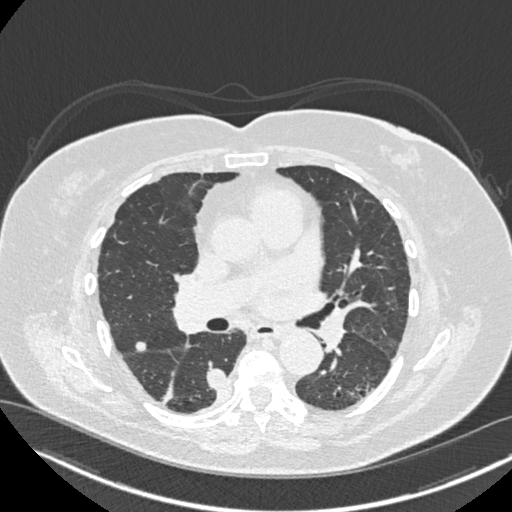
[im 223/371  lung]
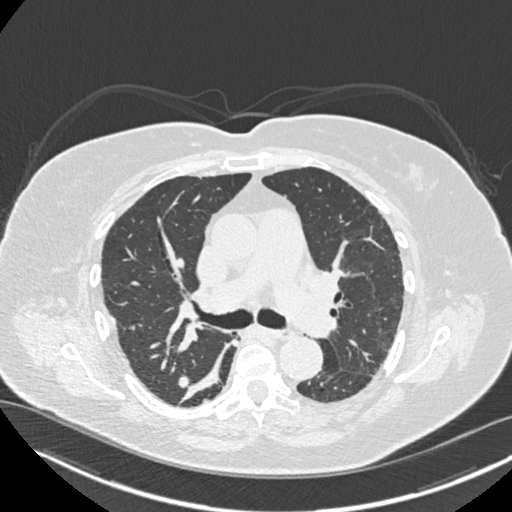
[im 247/371  lung]
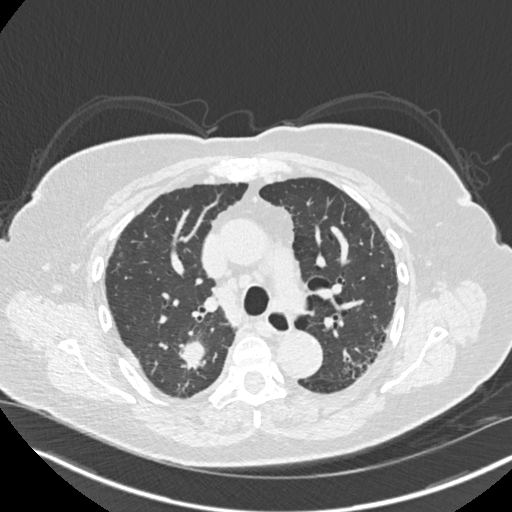
[im 272/371  mediastinal]
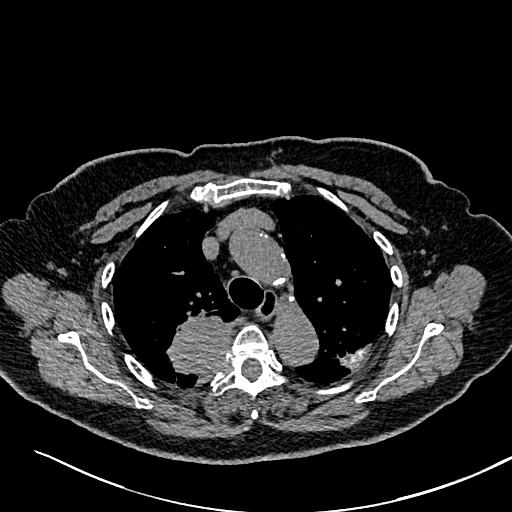
[im 272/371  lung]
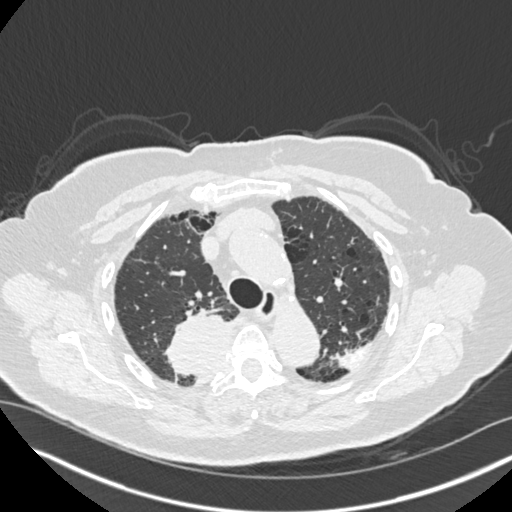
[im 321/371  lung]
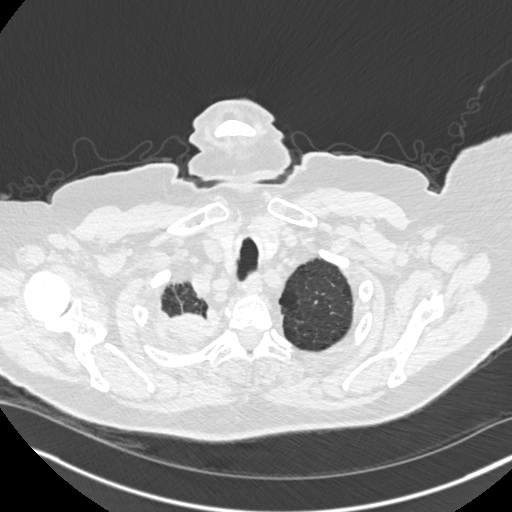
[im 346/371  lung]
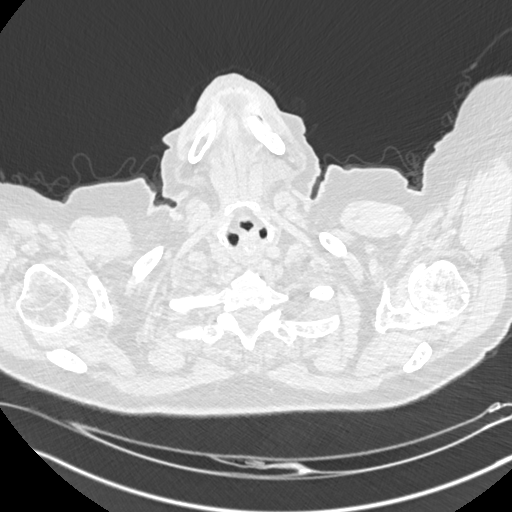

[14 of 36 positions shown; findings below may reference images not displayed]

FINDINGS: Cardiovascular: Aortic atherosclerosis. Normal heart size. Scattered
left and right coronary artery calcifications. No pericardial
effusion.

Mediastinum/Nodes: Unchanged enlarged pretracheal lymph nodes,
measuring up to 2.0 x 1.4 cm (series 2, image 57). Thyroid gland,
trachea, and esophagus demonstrate no significant findings.

Lungs/Pleura: Moderate centrilobular and paraseptal emphysema.
Significant interval enlargement of a pleural-based mass of
posterior right apex, now measuring 5.7 x 4.5 cm in greatest axial
section, previously 3.9 x 2.3 cm (series 8, image 31). There are
additional new, smaller pleural nodules about the apex (series 8,
image 26) as well as multiple new pleural nodules throughout the
lung, largest nodule in the dependent right lower lobe measuring
x 1.7 cm (series 8, image 107). Unchanged, partially calcified
scarring of the posterior left apex (series 8, image 39). Trace
right pleural effusion.

Upper Abdomen: No acute abnormality. Hepatic steatosis. Large,
partially imaged cyst of the left lobe of the liver measuring at
least 12.3 cm (series 2, image 137).

Musculoskeletal: No chest wall abnormality. New bony destruction of
the head of the right fourth rib underlying right apical mass
(series 2, image 29).
IMPRESSION: 1. Significant interval enlargement of a pleural-based mass of
posterior right apex, measuring 5.7 x 4.5 cm. There are additional
new, smaller pleural nodules about the apex as well as multiple new
pleural nodules throughout the lung, largest nodule in the dependent
right lower lobe measuring 2.4 x 1.7 cm. Findings are consistent
with primary lung malignancy and metastatic disease.
2. Direct involvement of the underlying chest wall by right apical
mass, with new bony destruction of the head of the right fourth rib.
3. Unchanged enlarged pretracheal lymph nodes, concerning for nodal
metastatic disease.
4. Trace right pleural effusion, presumed malignant.
5. Emphysema.
6. Coronary artery disease.
7. Large, partially imaged cyst of the left lobe of the liver,
although almost certainly benign, this may be symptomatic due to
large size and mass effect.

Aortic Atherosclerosis (86D1V-GLI.I) and Emphysema ([id number]-B2I.J).
